# Patient Record
Sex: Female | Born: 1980 | Race: White | Hispanic: No | Marital: Married | Smoking: Never smoker
Health system: Southern US, Community
[De-identification: ages and names within clinical notes are randomized; demographics above are authoritative.]

## PROBLEM LIST (undated history)

## (undated) ENCOUNTER — Inpatient Hospital Stay (HOSPITAL_COMMUNITY): Payer: Self-pay

## (undated) DIAGNOSIS — K219 Gastro-esophageal reflux disease without esophagitis: Secondary | ICD-10-CM

## (undated) DIAGNOSIS — F419 Anxiety disorder, unspecified: Secondary | ICD-10-CM

## (undated) DIAGNOSIS — G43909 Migraine, unspecified, not intractable, without status migrainosus: Secondary | ICD-10-CM

## (undated) DIAGNOSIS — Z8619 Personal history of other infectious and parasitic diseases: Secondary | ICD-10-CM

## (undated) DIAGNOSIS — R000 Tachycardia, unspecified: Secondary | ICD-10-CM

## (undated) DIAGNOSIS — N854 Malposition of uterus: Secondary | ICD-10-CM

## (undated) DIAGNOSIS — F909 Attention-deficit hyperactivity disorder, unspecified type: Secondary | ICD-10-CM

## (undated) DIAGNOSIS — U071 COVID-19: Secondary | ICD-10-CM

## (undated) DIAGNOSIS — N3010 Interstitial cystitis (chronic) without hematuria: Secondary | ICD-10-CM

## (undated) DIAGNOSIS — Z87448 Personal history of other diseases of urinary system: Secondary | ICD-10-CM

## (undated) DIAGNOSIS — J302 Other seasonal allergic rhinitis: Secondary | ICD-10-CM

## (undated) DIAGNOSIS — D649 Anemia, unspecified: Secondary | ICD-10-CM

## (undated) DIAGNOSIS — K589 Irritable bowel syndrome without diarrhea: Secondary | ICD-10-CM

## (undated) DIAGNOSIS — B999 Unspecified infectious disease: Secondary | ICD-10-CM

## (undated) HISTORY — DX: Anxiety disorder, unspecified: F41.9

## (undated) HISTORY — DX: Anemia, unspecified: D64.9

## (undated) HISTORY — DX: Migraine, unspecified, not intractable, without status migrainosus: G43.909

## (undated) HISTORY — DX: Interstitial cystitis (chronic) without hematuria: N30.10

## (undated) HISTORY — PX: TUBAL LIGATION: SHX77

## (undated) HISTORY — DX: Malposition of uterus: N85.4

## (undated) HISTORY — DX: Personal history of other diseases of urinary system: Z87.448

## (undated) HISTORY — DX: Personal history of other infectious and parasitic diseases: Z86.19

## (undated) HISTORY — DX: Irritable bowel syndrome, unspecified: K58.9

## (undated) HISTORY — DX: Attention-deficit hyperactivity disorder, unspecified type: F90.9

---

## 1999-03-20 ENCOUNTER — Encounter: Admission: RE | Admit: 1999-03-20 | Discharge: 1999-03-20 | Payer: Self-pay | Admitting: Urology

## 1999-03-20 ENCOUNTER — Encounter: Payer: Self-pay | Admitting: Urology

## 1999-03-21 ENCOUNTER — Encounter: Payer: Self-pay | Admitting: Urology

## 1999-03-21 ENCOUNTER — Ambulatory Visit (HOSPITAL_COMMUNITY): Admission: RE | Admit: 1999-03-21 | Discharge: 1999-03-21 | Payer: Self-pay | Admitting: Urology

## 2003-07-12 ENCOUNTER — Inpatient Hospital Stay (HOSPITAL_COMMUNITY): Admission: EM | Admit: 2003-07-12 | Discharge: 2003-07-14 | Payer: Self-pay | Admitting: Emergency Medicine

## 2007-08-06 ENCOUNTER — Encounter: Payer: Self-pay | Admitting: Cardiology

## 2008-05-18 ENCOUNTER — Encounter: Payer: Self-pay | Admitting: Cardiology

## 2008-06-07 DIAGNOSIS — R0602 Shortness of breath: Secondary | ICD-10-CM | POA: Insufficient documentation

## 2008-06-07 DIAGNOSIS — R000 Tachycardia, unspecified: Secondary | ICD-10-CM | POA: Insufficient documentation

## 2008-06-07 HISTORY — DX: Shortness of breath: R06.02

## 2008-06-07 HISTORY — DX: Tachycardia, unspecified: R00.0

## 2008-06-08 ENCOUNTER — Ambulatory Visit: Payer: Self-pay | Admitting: Cardiology

## 2008-07-22 ENCOUNTER — Ambulatory Visit: Payer: Self-pay | Admitting: Cardiology

## 2008-07-22 ENCOUNTER — Ambulatory Visit: Payer: Self-pay

## 2008-08-03 ENCOUNTER — Telehealth (INDEPENDENT_AMBULATORY_CARE_PROVIDER_SITE_OTHER): Payer: Self-pay | Admitting: *Deleted

## 2008-08-04 ENCOUNTER — Encounter: Payer: Self-pay | Admitting: Cardiology

## 2008-08-04 ENCOUNTER — Ambulatory Visit: Payer: Self-pay

## 2008-08-06 ENCOUNTER — Telehealth: Payer: Self-pay | Admitting: Cardiology

## 2008-08-12 ENCOUNTER — Telehealth: Payer: Self-pay | Admitting: Cardiology

## 2008-08-23 ENCOUNTER — Ambulatory Visit: Payer: Self-pay | Admitting: Cardiology

## 2008-09-06 ENCOUNTER — Telehealth: Payer: Self-pay | Admitting: Cardiology

## 2008-10-14 ENCOUNTER — Ambulatory Visit: Payer: Self-pay | Admitting: Cardiology

## 2008-12-10 ENCOUNTER — Emergency Department (HOSPITAL_COMMUNITY): Admission: EM | Admit: 2008-12-10 | Discharge: 2008-12-10 | Payer: Self-pay | Admitting: Family Medicine

## 2010-03-12 LAB — CONVERTED CEMR LAB
Basophils Absolute: 0 10*3/uL (ref 0.0–0.1)
Basophils Relative: 0 % (ref 0.0–3.0)
Eosinophils Absolute: 0 10*3/uL (ref 0.0–0.7)
Eosinophils Relative: 0.6 % (ref 0.0–5.0)
HCT: 45.1 % (ref 36.0–46.0)
Hemoglobin: 15.4 g/dL — ABNORMAL HIGH (ref 12.0–15.0)
Lymphocytes Relative: 24.3 % (ref 12.0–46.0)
Lymphs Abs: 1.7 10*3/uL (ref 0.7–4.0)
MCHC: 34 g/dL (ref 30.0–36.0)
MCV: 92.1 fL (ref 78.0–100.0)
Monocytes Absolute: 0.2 10*3/uL (ref 0.1–1.0)
Monocytes Relative: 2.4 % — ABNORMAL LOW (ref 3.0–12.0)
Neutro Abs: 5.3 10*3/uL (ref 1.4–7.7)
Neutrophils Relative %: 72.7 % (ref 43.0–77.0)
Platelets: 200 10*3/uL (ref 150.0–400.0)
RBC: 4.9 M/uL (ref 3.87–5.11)
RDW: 11.7 % (ref 11.5–14.6)
TSH: 1.45 microintl units/mL (ref 0.35–5.50)
WBC: 7.2 10*3/uL (ref 4.5–10.5)

## 2010-03-16 LAB — STREP B DNA PROBE: GBS: NEGATIVE

## 2010-03-31 ENCOUNTER — Ambulatory Visit (HOSPITAL_COMMUNITY)
Admission: RE | Admit: 2010-03-31 | Discharge: 2010-03-31 | Disposition: A | Discharge status: Home / Self Care | Payer: BC Managed Care – PPO | Source: Ambulatory Visit | Attending: Obstetrics and Gynecology | Admitting: Obstetrics and Gynecology

## 2010-03-31 DIAGNOSIS — M79609 Pain in unspecified limb: Secondary | ICD-10-CM | POA: Insufficient documentation

## 2010-05-18 LAB — POCT URINALYSIS DIP (DEVICE)
Bilirubin Urine: NEGATIVE
Nitrite: NEGATIVE
Protein, ur: 30 mg/dL — AB
pH: 6.5 (ref 5.0–8.0)

## 2010-05-18 LAB — URINE CULTURE

## 2010-06-30 NOTE — H&P (Signed)
NAME:  Tanya [name], BOOZ                            ACCOUNT NO.:  [id number]   MEDICAL RECORD NO.:  [id number]                   PATIENT TYPE:  EMS   LOCATION:  ED                                   FACILITY:  [facility]   PHYSICIAN:  Hanley Hays. Dechurch, M.D.           DATE OF BIRTH:  03-08-1980   DATE OF ADMISSION:  07/12/2003  DATE OF DISCHARGE:                                HISTORY & PHYSICAL   A 30 year old Caucasian female with history of chronic cystitis and  vesicoureteral reflux presents with a 12-hour history of acute onset nausea,  vomiting, right flank pain with fever and chills.  She was treated for a UTI  in early May with unknown antibiotic.  Here in the emergency room, she was  initially tachycardic with rates in the 150s and in significant distress  secondary to pain and nausea.  Her white count was 22,000.  The patient  underwent CT scanning which revealed an enlarged right kidney which  apparently is somewhat chronic.  No obstruction but evidence of perinephric  stranding and question of a cyst versus abscess on the upper pole.  She  received Rocephin and Levaquin in the emergency room and is being admitted  to the hospital for further treatment.  A urinalysis is consistent with UTI  with too-numerous-to-count white cells and bacteria.   PAST MEDICAL HISTORY:  Vesicoureteral reflux and recurrent UTI.  She has  never had pyelonephritis to her knowledge.  Otherwise healthy with history  of cystoscopy, cystometrogram in 2001 per Dr. Logan Bores.   PAST SURGICAL HISTORY:  No past surgical history, gravida 0, para 0.  Last  menstrual period was two weeks ago.   MEDICATIONS:  None.  She previously was on oral contraceptives until  recently, stopped because of mid cycle spotting.   SOCIAL HISTORY:  She is single, sexually active.  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  No alcohol, tobacco, or illicit drugs.   REVIEW OF SYSTEMS:  As per HPI.   FAMILY MEDICAL  HISTORY:  She has a mother with hypothyroidism.  She has an  aunt who has had renal and hepatic transplant.   PHYSICAL EXAMINATION:  GENERAL: Well-developed, well-nourished, pleasant  female in no distress.  VITAL SIGNS:  Temperature 102.3, pulse currently 120, blood pressure 120/60.  HEENT: Oropharynx is moist,no lesions.  LUNGS:  Clear to auscultation.  HEART:  Regular, tachycardic, no murmur.  ABDOMEN:  Soft.  There is some mild to moderate right flank and upper  quadrant tenderness.  No masses noted.  No guarding, no rebound.  Bowel  sounds are positive.  EXTREMITIES:  Without clubbing, cyanosis, or edema.  NEUROLOGIC:  Intact.  SKIN:  Without rash, lesion, or breakdown.   IMPRESSION:  1. Pyelonephritis.  2. History of vesicoureteral reflux/chronic cystitis, has been stable.  3. Tachycardia, probably on the basis of volume depletion.   PLAN:  1.  Continue the current antibiotic regimen until culture data is available.  2. IV fluids.     ___________________________________________                                         Hanley Hays Josefine Class, M.D.   FED/MEDQ  D:  07/12/2003  T:  07/12/2003  Job:  [id number]

## 2010-06-30 NOTE — Discharge Summary (Signed)
NAME:  Tanya [name], Tanya [name]                            ACCOUNT NO.:  [id number]   MEDICAL RECORD NO.:  [id number]                   PATIENT TYPE:  INP   LOCATION:  A409                                 FACILITY:  [facility]   PHYSICIAN:  Hanley Hays. Dechurch, M.D.           DATE OF BIRTH:  1980-12-11   DATE OF ADMISSION:  07/12/2003  DATE OF DISCHARGE:  07/14/2003                                 DISCHARGE SUMMARY   DIAGNOSES:  1. Right pyelonephritis due to E. coli.  2. History of chronic vesicoureteral reflux, chronically enlarged right     kidney.  3. Nephrolithiasis, asymptomatic.  4. Dehydration, resolved.   HOSPITAL COURSE:  This is a healthy 31 year old Caucasian female with no  regular medical follow-up presents to the emergency room with a one day  history of flank pain, nausea, vomiting, chills and fever.  CT scan revealed  an enlarged right kidney with perinephric stranding and question of an early  abscess versus cyst in the upper pole.   She received Rocephin and Levaquin and was admitted to the hospital for  further treatment.  The urine culture grew E. coli.  Sensitivities are  pending at the time of discharge.  Blood cultures remained negative at 48  hours.  She was markedly improved.  Her white count was near normal and she  was back to baseline stage.   She is being discharged to home.  She is scheduled to follow-up with Dr. Geanie Cooley, who was the unassigned physician on call for that day.  She was  advised to follow-up with her urologist, given her history, to be sure that  the pyelonephritis has cleared.  Complete a 14 day course of antibiotic.  The patient seemed to have good understanding at the time of discharge.   She is being discharged on Levaquin 500 mg daily pending culture.  She is  instructed to call with recurrent fever, abdominal pain, chills, etc. or if  there are any questions to me until she is seen by the follow-up physician.  She may need a follow-up CT  scan to assure clearing of the questionable  abscess.     ___________________________________________                                         Hanley Hays Josefine Class, M.D.   FED/MEDQ  D:  07/14/2003  T:  07/15/2003  Job:  [id number]   cc:   [facility]

## 2010-09-19 LAB — ABO/RH

## 2010-09-19 LAB — HEPATITIS B SURFACE ANTIGEN: Hepatitis B Surface Ag: NEGATIVE

## 2010-09-19 LAB — RPR: RPR: NONREACTIVE

## 2010-11-16 ENCOUNTER — Encounter (HOSPITAL_COMMUNITY): Payer: Self-pay | Admitting: *Deleted

## 2010-11-16 ENCOUNTER — Inpatient Hospital Stay (HOSPITAL_COMMUNITY)
Admission: AD | Admit: 2010-11-16 | Discharge: 2010-11-16 | Disposition: A | Discharge status: Home / Self Care | Payer: BC Managed Care – PPO | Source: Ambulatory Visit | Attending: Obstetrics and Gynecology | Admitting: Obstetrics and Gynecology

## 2010-11-16 DIAGNOSIS — O2390 Unspecified genitourinary tract infection in pregnancy, unspecified trimester: Secondary | ICD-10-CM | POA: Insufficient documentation

## 2010-11-16 DIAGNOSIS — A499 Bacterial infection, unspecified: Secondary | ICD-10-CM | POA: Insufficient documentation

## 2010-11-16 DIAGNOSIS — O200 Threatened abortion: Secondary | ICD-10-CM | POA: Insufficient documentation

## 2010-11-16 DIAGNOSIS — B9689 Other specified bacterial agents as the cause of diseases classified elsewhere: Secondary | ICD-10-CM

## 2010-11-16 DIAGNOSIS — N760 Acute vaginitis: Secondary | ICD-10-CM

## 2010-11-16 LAB — URINALYSIS, ROUTINE W REFLEX MICROSCOPIC
Leukocytes, UA: NEGATIVE
Nitrite: NEGATIVE
Specific Gravity, Urine: <1.005 — ABNORMAL LOW (ref 1.005–1.030)
pH: 6.5 (ref 5.0–8.0)

## 2010-11-16 LAB — WET PREP, GENITAL
Trich, Wet Prep: NONE SEEN
Yeast Wet Prep HPF POC: NONE SEEN

## 2010-11-16 MED ORDER — METRONIDAZOLE 500 MG PO TABS
500.0000 mg | ORAL_TABLET | Freq: Once | ORAL | Status: AC
  Administered 2010-11-16: 500 mg via ORAL
  Filled 2010-11-16: qty 1

## 2010-11-16 MED ORDER — METRONIDAZOLE 500 MG PO TABS: 500.0000 mg | ORAL_TABLET | Freq: Two times a day (BID) | ORAL | Status: AC

## 2010-11-16 NOTE — Progress Notes (Signed)
SAYS STARTED HAVING LOWER ABD PAIN AT 8AM-  SHE CALLED OFFICE AT 2PM- RETURNED CALLED CALL AT 5PM - SHE CALLED BACK-  TOLD TO COME HERE.  SAYS ABD PAIN COMES-GOES.  DRINKING WATER. WENT TO [facility]ST NIGHT - NO JOLTING RIDES.   HAD THICK WHITE D/C- IS ON ANTX FOR UTI.

## 2010-11-16 NOTE — ED Provider Notes (Signed)
History   Tanya [name] is a 30 y.o. year old [id number] female at [redacted]w[redacted]d weeks gestation who presents to MAU reporting contractions every 5-10 minutes this afternoon that have decreased since arrival to MAU. She reports pos FM and denies LOF or VB. She is at the end of a course of Macrobid for UTI and did experience Sx relief within 1 day of starting Tx. She denies UTI Sx.  CC: contractions  HPI  OB History    Grav Para Term Preterm Abortions TAB SAB Ect Mult Living   2 1 1       1       Past Medical History  Diagnosis Date  . No pertinent past medical history     Past Surgical History  Procedure Date  . No past surgeries     Family History  Problem Relation Age of Onset  . Hypertension Mother   . Hypothyroidism Mother   . Heart disease Sister     History  Substance Use Topics  . Smoking status: Never Smoker   . Smokeless tobacco: Not on file  . Alcohol Use: No    Allergies: No Known Allergies  Prescriptions prior to admission  Medication Sig Dispense Refill  . nitrofurantoin (MACRODANTIN) 100 MG capsule Take 100 mg by mouth 2 (two) times daily.        . prenatal vitamin w/FE, FA (PRENATAL 1 + 1) 27-1 MG TABS Take 1 tablet by mouth daily.          ROS: Otherwise neg Physical Exam   Blood pressure 113/67, pulse 93, temperature 98.9 F (37.2 C), temperature source Oral, resp. rate 20, height 5\' 5"  (1.651 m), weight 84.482 kg (186 lb 4 oz).  Physical Exam  Constitutional: She is oriented to person, place, and time. She appears well-developed and well-nourished. No distress.  Cardiovascular: Normal rate.   Respiratory: Effort normal.  GI: Soft. She exhibits no distension and no mass. There is tenderness (Mild LLQ). There is no guarding.       No UC's palpated  Genitourinary: Uterus is not tender. Cervix exhibits no motion tenderness, no discharge and no friability. There is bleeding around the vagina. No erythema or tenderness around the vagina. Vaginal discharge (mod  amount of thin white, malodorous discharge) found.  Neurological: She is alert and oriented to person, place, and time.  Skin: Skin is warm and dry. She is not diaphoretic.  Psychiatric: She has a normal mood and affect.  Dilation: Closed Effacement (%): Thick Cervical Position: Posterior Station: Ballotable Presentation: Undeterminable  Results for orders placed during the hospital encounter of 11/16/10 (from the past 24 hour(s))  URINALYSIS, ROUTINE W REFLEX MICROSCOPIC     Status: Abnormal   Collection Time   11/16/10  7:59 PM      Component Value Range   Color, Urine STRAW (*) YELLOW    Appearance CLEAR  CLEAR    Specific Gravity, Urine <1.005 (*) 1.005 - 1.030    pH 6.5  5.0 - 8.0    Glucose, UA NEGATIVE  NEGATIVE (mg/dL)   Hgb urine dipstick NEGATIVE  NEGATIVE    Bilirubin Urine NEGATIVE  NEGATIVE    Ketones, ur NEGATIVE  NEGATIVE (mg/dL)   Protein, ur NEGATIVE  NEGATIVE (mg/dL)   Urobilinogen, UA 0.2  0.0 - 1.0 (mg/dL)   Nitrite NEGATIVE  NEGATIVE    Leukocytes, UA NEGATIVE  NEGATIVE   WET PREP, GENITAL     Status: Abnormal   Collection Time  11/16/10  9:34 PM      Component Value Range   Yeast, Wet Prep NONE SEEN  NONE SEEN    Trich, Wet Prep NONE SEEN  NONE SEEN    Clue Cells, Wet Prep FEW (*) NONE SEEN    WBC, Wet Prep HPF POC FEW (*) NONE SEEN     MAU Course  Procedures   Assessment and Plan  Assessment:  1. BV  Plan: 1. D/C home per consult w/ Dr. Rana Snare 2. Flagyl  [name] 11/16/2010, 9:59 PM

## 2010-12-20 ENCOUNTER — Other Ambulatory Visit: Payer: Self-pay

## 2010-12-20 ENCOUNTER — Other Ambulatory Visit (HOSPITAL_COMMUNITY): Payer: Self-pay | Admitting: Obstetrics and Gynecology

## 2010-12-20 DIAGNOSIS — IMO0002 Reserved for concepts with insufficient information to code with codable children: Secondary | ICD-10-CM

## 2010-12-26 ENCOUNTER — Ambulatory Visit (HOSPITAL_COMMUNITY)
Admission: RE | Admit: 2010-12-26 | Discharge: 2010-12-26 | Disposition: A | Discharge status: Home / Self Care | Payer: BC Managed Care – PPO | Source: Ambulatory Visit | Attending: Obstetrics and Gynecology | Admitting: Obstetrics and Gynecology

## 2010-12-26 DIAGNOSIS — Z3689 Encounter for other specified antenatal screening: Secondary | ICD-10-CM | POA: Insufficient documentation

## 2010-12-26 DIAGNOSIS — IMO0002 Reserved for concepts with insufficient information to code with codable children: Secondary | ICD-10-CM

## 2010-12-26 DIAGNOSIS — O368390 Maternal care for abnormalities of the fetal heart rate or rhythm, unspecified trimester, not applicable or unspecified: Secondary | ICD-10-CM | POA: Insufficient documentation

## 2010-12-26 NOTE — Progress Notes (Signed)
OB US completed today.  No evidence of fetal arrhythmia noted.  Follow up scheduled.

## 2011-01-03 ENCOUNTER — Emergency Department (INDEPENDENT_AMBULATORY_CARE_PROVIDER_SITE_OTHER)
Admission: EM | Admit: 2011-01-03 | Discharge: 2011-01-03 | Disposition: A | Discharge status: Home / Self Care | Payer: BC Managed Care – PPO | Source: Home / Self Care

## 2011-01-03 ENCOUNTER — Encounter (HOSPITAL_COMMUNITY): Payer: Self-pay | Admitting: *Deleted

## 2011-01-03 DIAGNOSIS — J329 Chronic sinusitis, unspecified: Secondary | ICD-10-CM

## 2011-01-03 HISTORY — DX: Tachycardia, unspecified: R00.0

## 2011-01-03 MED ORDER — AMOXICILLIN 500 MG PO CAPS: 500.0000 mg | ORAL_CAPSULE | Freq: Two times a day (BID) | ORAL | Status: AC

## 2011-01-03 MED ORDER — TRIAMCINOLONE ACETONIDE(NASAL) 55 MCG/ACT NA INHA: 2.0000 | Freq: Every day | NASAL | Status: DC

## 2011-01-03 NOTE — ED Provider Notes (Signed)
History     CSN: [id number] Arrival date & time: 01/03/2011  8:31 AM   First MD Initiated Contact with Patient 01/03/11 [phone]      Chief Complaint  Patient presents with  . URI    (Consider location/radiation/quality/duration/timing/severity/associated sxs/prior treatment) Patient is a 30 y.o. female presenting with URI and sinusitis.  URI The primary symptoms include headaches, sore throat and cough. Primary symptoms do not include fever, fatigue, ear pain, swollen glands, wheezing, abdominal pain, nausea, vomiting, myalgias, arthralgias or rash. The current episode started more than 1 week ago.  The sore throat is not accompanied by hoarse voice.   Symptoms associated with the illness include sinus pressure and congestion. The illness is not associated with chills.  Sinusitis  This is a recurrent problem. The current episode started more than 1 week ago. The problem has been gradually worsening. There has been no fever. The pain is moderate. The pain has been constant since onset. Associated symptoms include congestion, sinus pressure, sore throat and cough. Pertinent negatives include no chills, no sweats, no ear pain, no hoarse voice, no swollen glands and no shortness of breath. Associated symptoms comments: "Just not feeling well". Treatments tried: antihistamin. The treatment provided mild relief.    Past Medical History  Diagnosis Date  . No pertinent past medical history   . Tachycardia     Past Surgical History  Procedure Date  . No past surgeries     Family History  Problem Relation Age of Onset  . Hypertension Mother   . Hypothyroidism Mother   . Heart disease Sister     History  Substance Use Topics  . Smoking status: Never Smoker   . Smokeless tobacco: Not on file  . Alcohol Use: No    OB History    Grav Para Term Preterm Abortions TAB SAB Ect Mult Living   2 1 1       1       Review of Systems  Constitutional: Negative for fever, chills, activity  change, appetite change and fatigue.  HENT: Positive for congestion, sore throat and sinus pressure. Negative for ear pain, hoarse voice, dental problem and voice change.   Eyes: Negative for discharge.  Respiratory: Positive for cough. Negative for shortness of breath and wheezing.   Cardiovascular: Negative for chest pain and leg swelling.  Gastrointestinal: Negative for nausea, vomiting and abdominal pain.  Musculoskeletal: Negative for myalgias and arthralgias.  Skin: Negative for rash.  Neurological: Positive for headaches.    Allergies  Review of patient's allergies indicates no known allergies.  Home Medications   Current Outpatient Rx  Name Route Sig Dispense Refill  . HYDROXYZINE HCL 10 MG PO TABS Oral Take 10 mg by mouth 3 (three) times daily as needed.      Marland Kitchen PRENATAL PLUS 27-1 MG PO TABS Oral Take 1 tablet by mouth daily.      . AMOXICILLIN 500 MG PO CAPS Oral Take 1 capsule (500 mg total) by mouth 2 (two) times daily. 21 capsule 0  . NITROFURANTOIN MACROCRYSTAL 100 MG PO CAPS Oral Take 100 mg by mouth 2 (two) times daily.      . TRIAMCINOLONE ACETONIDE 55 MCG/ACT NA INHA Nasal Place 2 sprays into the nose daily. 1 Inhaler 12    BP 120/78  Pulse 106  Temp(Src) 98.2 F (36.8 C) (Oral)  Resp 18  SpO2 98%  Physical Exam  Nursing note and vitals reviewed. Constitutional: She appears well-developed and well-nourished. No distress.  HENT:  Head: Normocephalic and atraumatic.  Right Ear: Tympanic membrane, external ear and ear canal normal.  Left Ear: Tympanic membrane, external ear and ear canal normal.  Nose: Mucosal edema and rhinorrhea present. Right sinus exhibits maxillary sinus tenderness. Left sinus exhibits maxillary sinus tenderness.  Mouth/Throat: Uvula is midline and mucous membranes are normal. Normal dentition. Posterior oropharyngeal erythema present. No oropharyngeal exudate or posterior oropharyngeal edema.  Cardiovascular: Normal rate, regular rhythm  and normal heart sounds.   Pulmonary/Chest: Effort normal and breath sounds normal. No respiratory distress. She has no wheezes. She has no rales. She exhibits no tenderness.    ED Course  Procedures (including critical care time)  Labs Reviewed - No data to display No results found.   1. Rhinosinusitis       MDM          Sharin Grave, MD 01/07/11 [phone]

## 2011-01-03 NOTE — ED Notes (Signed)
Pt c/o sinus congestion and pain onset last week.  Thought it was due to changing from Zyrtec to Atarax.  But states it's getting progressively worse.  Has thick green nasal drainage and throat hurts some.  No fever noted. Pt is [redacted] weeks pregnant.  Cannot take any meds with a decongestant in it due to the fetus has an arrythmia.

## 2011-01-23 ENCOUNTER — Ambulatory Visit (HOSPITAL_COMMUNITY)
Admission: RE | Admit: 2011-01-23 | Discharge: 2011-01-23 | Disposition: A | Discharge status: Home / Self Care | Payer: BC Managed Care – PPO | Source: Ambulatory Visit | Attending: Obstetrics and Gynecology | Admitting: Obstetrics and Gynecology

## 2011-01-23 DIAGNOSIS — IMO0002 Reserved for concepts with insufficient information to code with codable children: Secondary | ICD-10-CM

## 2011-01-23 DIAGNOSIS — O368390 Maternal care for abnormalities of the fetal heart rate or rhythm, unspecified trimester, not applicable or unspecified: Secondary | ICD-10-CM | POA: Insufficient documentation

## 2011-02-13 NOTE — L&D Delivery Note (Signed)
I was called for a delivery and arrived in 3 minutes to a precipitous delivery attended by resident physician of the baby.  I took over with cord blood and placenta.  SVD viable female Apgars 9,9 over intact perineum.  Placenta delivered spontaneously intact with 3VC.good support and hemostasis noted and R/V exam confirms.  PH art was sent.  [place] cord blood was done.  Mother and baby were doing well.  EBL 300cc  Candice Camp, MD

## 2011-03-27 LAB — STREP B DNA PROBE: GBS: NEGATIVE

## 2011-04-09 ENCOUNTER — Telehealth (HOSPITAL_COMMUNITY): Payer: Self-pay | Admitting: *Deleted

## 2011-04-09 ENCOUNTER — Encounter (HOSPITAL_COMMUNITY): Payer: Self-pay | Admitting: *Deleted

## 2011-04-09 NOTE — Telephone Encounter (Signed)
Preadmission screen  

## 2011-04-14 ENCOUNTER — Encounter (HOSPITAL_COMMUNITY): Payer: Self-pay

## 2011-04-14 ENCOUNTER — Inpatient Hospital Stay (HOSPITAL_COMMUNITY)
Admission: AD | Admit: 2011-04-14 | Discharge: 2011-04-14 | Disposition: A | Discharge status: Home Health | Payer: BC Managed Care – PPO | Source: Ambulatory Visit | Attending: Obstetrics and Gynecology | Admitting: Obstetrics and Gynecology

## 2011-04-14 DIAGNOSIS — O479 False labor, unspecified: Secondary | ICD-10-CM | POA: Insufficient documentation

## 2011-04-14 NOTE — Progress Notes (Signed)
Dr Henderson Cloud notified of patient history, c/o ctx and back pain, tracing, ctx pattern and sve result. Order to discharge home

## 2011-04-14 NOTE — Progress Notes (Signed)
Patient is in with c/o ctx q72m for an hour. She states that she was 1cm this week. Reports good fetal movement, denies any vaginal bleeding or lof.

## 2011-04-16 ENCOUNTER — Inpatient Hospital Stay (HOSPITAL_COMMUNITY)
Admission: RE | Admit: 2011-04-16 | Discharge: 2011-04-18 | DRG: 373 | Disposition: A | Discharge status: Home / Self Care | Payer: BC Managed Care – PPO | Source: Ambulatory Visit | Attending: Obstetrics and Gynecology | Admitting: Obstetrics and Gynecology

## 2011-04-16 DIAGNOSIS — B343 Parvovirus infection, unspecified: Secondary | ICD-10-CM | POA: Diagnosis present

## 2011-04-16 DIAGNOSIS — O99892 Other specified diseases and conditions complicating childbirth: Secondary | ICD-10-CM | POA: Diagnosis present

## 2011-04-16 DIAGNOSIS — O409XX0 Polyhydramnios, unspecified trimester, not applicable or unspecified: Principal | ICD-10-CM | POA: Diagnosis present

## 2011-04-16 LAB — CBC
HCT: 34.7 % — ABNORMAL LOW (ref 36.0–46.0)
Hemoglobin: 11.6 g/dL — ABNORMAL LOW (ref 12.0–15.0)
MCH: 29.6 pg (ref 26.0–34.0)
MCHC: 33.4 g/dL (ref 30.0–36.0)
RDW: 13.1 % (ref 11.5–15.5)

## 2011-04-16 MED ORDER — LACTATED RINGERS IV SOLN
INTRAVENOUS | Status: DC
  Administered 2011-04-16 – 2011-04-17 (×3): via INTRAVENOUS

## 2011-04-16 MED ORDER — ONDANSETRON HCL 4 MG/2ML IJ SOLN: 4.0000 mg | Freq: Four times a day (QID) | INTRAMUSCULAR | Status: DC | PRN

## 2011-04-16 MED ORDER — OXYTOCIN 20 UNITS IN LACTATED RINGERS INFUSION - SIMPLE: 125.0000 mL/h | Freq: Once | INTRAVENOUS | Status: DC

## 2011-04-16 MED ORDER — LACTATED RINGERS IV SOLN: 500.0000 mL | INTRAVENOUS | Status: DC | PRN

## 2011-04-16 MED ORDER — OXYCODONE-ACETAMINOPHEN 5-325 MG PO TABS: 1.0000 | ORAL_TABLET | ORAL | Status: DC | PRN

## 2011-04-16 MED ORDER — TERBUTALINE SULFATE 1 MG/ML IJ SOLN: 0.2500 mg | Freq: Once | INTRAMUSCULAR | Status: AC | PRN

## 2011-04-16 MED ORDER — PANTOPRAZOLE SODIUM 40 MG PO TBEC
40.0000 mg | DELAYED_RELEASE_TABLET | Freq: Every day | ORAL | Status: DC
  Administered 2011-04-16: 40 mg via ORAL
  Filled 2011-04-16 (×3): qty 1

## 2011-04-16 MED ORDER — IBUPROFEN 600 MG PO TABS: 600.0000 mg | ORAL_TABLET | Freq: Four times a day (QID) | ORAL | Status: DC | PRN

## 2011-04-16 MED ORDER — CITRIC ACID-SODIUM CITRATE 334-500 MG/5ML PO SOLN: 30.0000 mL | ORAL | Status: DC | PRN

## 2011-04-16 MED ORDER — OXYTOCIN BOLUS FROM INFUSION
500.0000 mL | Freq: Once | INTRAVENOUS | Status: DC
  Filled 2011-04-16: qty 500

## 2011-04-16 MED ORDER — ZOLPIDEM TARTRATE 10 MG PO TABS
10.0000 mg | ORAL_TABLET | Freq: Every evening | ORAL | Status: DC | PRN
  Administered 2011-04-16: 10 mg via ORAL
  Filled 2011-04-16: qty 1

## 2011-04-16 MED ORDER — ACETAMINOPHEN 325 MG PO TABS: 650.0000 mg | ORAL_TABLET | ORAL | Status: DC | PRN

## 2011-04-16 MED ORDER — LIDOCAINE HCL (PF) 1 % IJ SOLN: 30.0000 mL | INTRAMUSCULAR | Status: DC | PRN

## 2011-04-16 MED ORDER — FLEET ENEMA 7-19 GM/118ML RE ENEM: 1.0000 | ENEMA | RECTAL | Status: DC | PRN

## 2011-04-16 MED ORDER — MISOPROSTOL 25 MCG QUARTER TABLET
25.0000 ug | ORAL_TABLET | ORAL | Status: DC | PRN
  Administered 2011-04-16 – 2011-04-17 (×2): 25 ug via VAGINAL
  Filled 2011-04-16 (×2): qty 0.25

## 2011-04-17 ENCOUNTER — Encounter (HOSPITAL_COMMUNITY): Payer: Self-pay

## 2011-04-17 ENCOUNTER — Encounter (HOSPITAL_COMMUNITY): Payer: Self-pay | Admitting: Anesthesiology

## 2011-04-17 ENCOUNTER — Inpatient Hospital Stay (HOSPITAL_COMMUNITY): Payer: BC Managed Care – PPO | Admitting: Anesthesiology

## 2011-04-17 MED ORDER — ONDANSETRON HCL 4 MG PO TABS: 4.0000 mg | ORAL_TABLET | ORAL | Status: DC | PRN

## 2011-04-17 MED ORDER — BUTORPHANOL TARTRATE 2 MG/ML IJ SOLN
1.0000 mg | Freq: Once | INTRAMUSCULAR | Status: AC
  Administered 2011-04-17: 1 mg via INTRAVENOUS
  Filled 2011-04-17: qty 1

## 2011-04-17 MED ORDER — FENTANYL 2.5 MCG/ML BUPIVACAINE 1/10 % EPIDURAL INFUSION (WH - ANES)
14.0000 mL/h | INTRAMUSCULAR | Status: DC
  Administered 2011-04-17 (×2): 14 mL/h via EPIDURAL
  Filled 2011-04-17 (×2): qty 60

## 2011-04-17 MED ORDER — ONDANSETRON HCL 4 MG/2ML IJ SOLN: 4.0000 mg | INTRAMUSCULAR | Status: DC | PRN

## 2011-04-17 MED ORDER — EPHEDRINE 5 MG/ML INJ
10.0000 mg | INTRAVENOUS | Status: DC | PRN
  Filled 2011-04-17: qty 4

## 2011-04-17 MED ORDER — TERBUTALINE SULFATE 1 MG/ML IJ SOLN: 0.2500 mg | Freq: Once | INTRAMUSCULAR | Status: DC | PRN

## 2011-04-17 MED ORDER — PHENYLEPHRINE 40 MCG/ML (10ML) SYRINGE FOR IV PUSH (FOR BLOOD PRESSURE SUPPORT): 80.0000 ug | PREFILLED_SYRINGE | INTRAVENOUS | Status: DC | PRN

## 2011-04-17 MED ORDER — MEDROXYPROGESTERONE ACETATE 150 MG/ML IM SUSP: 150.0000 mg | INTRAMUSCULAR | Status: DC | PRN

## 2011-04-17 MED ORDER — SIMETHICONE 80 MG PO CHEW: 80.0000 mg | CHEWABLE_TABLET | ORAL | Status: DC | PRN

## 2011-04-17 MED ORDER — OXYCODONE-ACETAMINOPHEN 5-325 MG PO TABS
1.0000 | ORAL_TABLET | ORAL | Status: DC | PRN
  Administered 2011-04-18 (×2): 1 via ORAL
  Filled 2011-04-17 (×2): qty 1

## 2011-04-17 MED ORDER — TETANUS-DIPHTH-ACELL PERTUSSIS 5-2.5-18.5 LF-MCG/0.5 IM SUSP: 0.5000 mL | Freq: Once | INTRAMUSCULAR | Status: DC

## 2011-04-17 MED ORDER — WITCH HAZEL-GLYCERIN EX PADS: 1.0000 "application " | MEDICATED_PAD | CUTANEOUS | Status: DC | PRN

## 2011-04-17 MED ORDER — ZOLPIDEM TARTRATE 5 MG PO TABS: 5.0000 mg | ORAL_TABLET | Freq: Every evening | ORAL | Status: DC | PRN

## 2011-04-17 MED ORDER — EPHEDRINE 5 MG/ML INJ: 10.0000 mg | INTRAVENOUS | Status: DC | PRN

## 2011-04-17 MED ORDER — PHENYLEPHRINE 40 MCG/ML (10ML) SYRINGE FOR IV PUSH (FOR BLOOD PRESSURE SUPPORT)
80.0000 ug | PREFILLED_SYRINGE | INTRAVENOUS | Status: DC | PRN
  Filled 2011-04-17: qty 5

## 2011-04-17 MED ORDER — LANOLIN HYDROUS EX OINT: TOPICAL_OINTMENT | CUTANEOUS | Status: DC | PRN

## 2011-04-17 MED ORDER — DIPHENHYDRAMINE HCL 25 MG PO CAPS: 25.0000 mg | ORAL_CAPSULE | Freq: Four times a day (QID) | ORAL | Status: DC | PRN

## 2011-04-17 MED ORDER — IBUPROFEN 600 MG PO TABS
600.0000 mg | ORAL_TABLET | Freq: Four times a day (QID) | ORAL | Status: DC
  Administered 2011-04-17 – 2011-04-18 (×5): 600 mg via ORAL
  Filled 2011-04-17 (×5): qty 1

## 2011-04-17 MED ORDER — DIPHENHYDRAMINE HCL 50 MG/ML IJ SOLN: 12.5000 mg | INTRAMUSCULAR | Status: DC | PRN

## 2011-04-17 MED ORDER — LACTATED RINGERS IV SOLN: 500.0000 mL | Freq: Once | INTRAVENOUS | Status: DC

## 2011-04-17 MED ORDER — OXYTOCIN 20 UNITS IN LACTATED RINGERS INFUSION - SIMPLE
1.0000 m[IU]/min | INTRAVENOUS | Status: DC
  Administered 2011-04-17: 2 m[IU]/min via INTRAVENOUS
  Filled 2011-04-17: qty 1000

## 2011-04-17 MED ORDER — MEASLES, MUMPS & RUBELLA VAC ~~LOC~~ INJ
0.5000 mL | INJECTION | Freq: Once | SUBCUTANEOUS | Status: DC
  Filled 2011-04-17: qty 0.5

## 2011-04-17 MED ORDER — LIDOCAINE HCL (PF) 1 % IJ SOLN
INTRAMUSCULAR | Status: DC | PRN
  Administered 2011-04-17 (×2): 5 mL

## 2011-04-17 MED ORDER — SENNOSIDES-DOCUSATE SODIUM 8.6-50 MG PO TABS
2.0000 | ORAL_TABLET | Freq: Every day | ORAL | Status: DC
  Administered 2011-04-17: 2 via ORAL

## 2011-04-17 MED ORDER — PRENATAL MULTIVITAMIN CH
1.0000 | ORAL_TABLET | Freq: Every day | ORAL | Status: DC
  Administered 2011-04-18: 1 via ORAL
  Filled 2011-04-17: qty 1

## 2011-04-17 MED ORDER — BENZOCAINE-MENTHOL 20-0.5 % EX AERO
1.0000 "application " | INHALATION_SPRAY | CUTANEOUS | Status: DC | PRN
  Administered 2011-04-18: 1 via TOPICAL

## 2011-04-17 MED ORDER — DIBUCAINE 1 % RE OINT: 1.0000 "application " | TOPICAL_OINTMENT | RECTAL | Status: DC | PRN

## 2011-04-17 NOTE — Anesthesia Postprocedure Evaluation (Signed)
  Anesthesia Post-op Note  Patient: Tanya [name]  Procedure(s) Performed: * No procedures listed *  Patient Location: PACU and Mother/Baby  Anesthesia Type: Epidural  Level of Consciousness: awake, alert  and oriented  Airway and Oxygen Therapy: Patient Spontanous Breathing  Post-op Pain: none  Post-op Assessment: Post-op Vital signs reviewed, Patient's Cardiovascular Status Stable, No headache, No backache, No residual numbness and No residual motor weakness  Post-op Vital Signs: Reviewed and stable  Complications: No apparent anesthesia complications

## 2011-04-17 NOTE — Anesthesia Preprocedure Evaluation (Signed)
Anesthesia Evaluation  Patient identified by MRN, date of birth, ID band Patient awake    Reviewed: Allergy & Precautions, H&P , Patient's Chart, lab work & pertinent test results  Airway Mallampati: II TM Distance: >3 FB Neck ROM: full    Dental No notable dental hx.    Pulmonary neg pulmonary ROS, shortness of breath,  breath sounds clear to auscultation  Pulmonary exam normal       Cardiovascular negative cardio ROS  Rhythm:regular Rate:Normal     Neuro/Psych negative neurological ROS  negative psych ROS   GI/Hepatic negative GI ROS, Neg liver ROS,   Endo/Other  negative endocrine ROS  Renal/GU negative Renal ROS     Musculoskeletal   Abdominal   Peds  Hematology negative hematology ROS (+)   Anesthesia Other Findings   Reproductive/Obstetrics (+) Pregnancy                           Anesthesia Physical Anesthesia Plan  ASA: II  Anesthesia Plan: Epidural   Post-op Pain Management:    Induction:   Airway Management Planned:   Additional Equipment:   Intra-op Plan:   Post-operative Plan:   Informed Consent: I have reviewed the patients History and Physical, chart, labs and discussed the procedure including the risks, benefits and alternatives for the proposed anesthesia with the patient or authorized representative who has indicated his/her understanding and acceptance.     Plan Discussed with:   Anesthesia Plan Comments:         Anesthesia Quick Evaluation

## 2011-04-17 NOTE — Anesthesia Procedure Notes (Signed)
Epidural Patient location during procedure: OB Start time: 04/17/2011 11:17 AM  Staffing Anesthesiologist: Brayton Caves R Performed by: anesthesiologist   Preanesthetic Checklist Completed: patient identified, site marked, surgical consent, pre-op evaluation, timeout performed, IV checked, risks and benefits discussed and monitors and equipment checked  Epidural Patient position: sitting Prep: site prepped and draped and DuraPrep Patient monitoring: continuous pulse ox and blood pressure Approach: midline Injection technique: LOR air and LOR saline  Needle:  Needle type: Tuohy  Needle gauge: 17 G Needle length: 9 cm Needle insertion depth: 5 cm cm Catheter type: closed end flexible Catheter size: 19 Gauge Catheter at skin depth: 10 cm Test dose: negative  Assessment Events: blood not aspirated, injection not painful, no injection resistance, negative IV test and no paresthesia  Additional Notes Patient identified.  Risk benefits discussed including failed block, incomplete pain control, headache, nerve damage, paralysis, blood pressure changes, nausea, vomiting, reactions to medication both toxic or allergic, and postpartum back pain.  Patient expressed understanding and wished to proceed.  All questions were answered.  Sterile technique used throughout procedure and epidural site dressed with sterile barrier dressing. No paresthesia or other complications noted.The patient did not experience any signs of intravascular injection such as tinnitus or metallic taste in mouth nor signs of intrathecal spread such as rapid motor block. Please see nursing notes for vital signs.

## 2011-04-17 NOTE — H&P (Signed)
Tanya [name] is a 31 y.o. female presenting for induction of labor due to recent parvo infection confirmed with lab IGm Seroconversion and followed with serial [place].  Also with polyhydraminos and prolonged labor sxs and fetal arrythmia with normal cardiac workup by  MFM . History OB History    Grav Para Term Preterm Abortions TAB SAB Ect Mult Living   2 1 1       1      Past Medical History  Diagnosis Date  . No pertinent past medical history   . Tachycardia   . History of chicken pox   . Tilted uterus   . Anemia     postpartum  . IBS (irritable bowel syndrome)   . History of pyelonephritis     as child  . ADHD (attention deficit hyperactivity disorder)    Past Surgical History  Procedure Date  . No past surgeries    Family History: family history includes Cancer in her maternal grandfather; Diabetes in her paternal grandfather; Hypertension in her mother; Hypothyroidism in her mother; Lupus in her paternal grandmother; and Thyroid disease in her mother. Social History:  reports that she has never smoked. She has never used smokeless tobacco. She reports that she does not drink alcohol or use illicit drugs.  ROS  Dilation: 2.5 Effacement (%): 50 Station: -2 Exam by:: Kenzo Ozment Blood pressure 129/71, pulse 84, temperature 97.8 F (36.6 C), temperature source Oral, resp. rate 20, height 5\' 8"  (1.727 m), weight 99.791 kg (220 lb). Exam Physical Exam  Prenatal labs: ABO, Rh: O/Positive/-- (08/07 0000) Antibody: Negative (08/07 0000) Rubella: Immune (08/07 0000) RPR: NON REACTIVE (03/04 2010)  HBsAg: Negative (08/07 0000)  HIV: Non-reactive (08/07 0000)  GBS: Negative (02/12 0000)   Assessment/Plan: IUP at term for induction due to parvo, polyhydraminos, fetal arrhythmia AROM, Pit, anticipate svd   Jayquon Theiler C 04/17/2011, 9:41 AM

## 2011-04-17 NOTE — Progress Notes (Signed)
Called to patient's bedside for precipitous delivery.  When I arrived, baby was crowning.  Patient delivered viable female with one push and without complication.  Dr Rana Snare arrived shortly after and assumed care of the patient.  Candelaria Celeste JEHIEL 04/17/2011 3:51 PM

## 2011-04-18 LAB — CBC
Hemoglobin: 10.5 g/dL — ABNORMAL LOW (ref 12.0–15.0)
MCHC: 33.2 g/dL (ref 30.0–36.0)
RBC: 3.53 MIL/uL — ABNORMAL LOW (ref 3.87–5.11)
WBC: 13.6 10*3/uL — ABNORMAL HIGH (ref 4.0–10.5)

## 2011-04-18 MED ORDER — OXYCODONE-ACETAMINOPHEN 5-325 MG PO TABS: 1.0000 | ORAL_TABLET | ORAL | Status: AC | PRN

## 2011-04-18 MED ORDER — BENZOCAINE-MENTHOL 20-0.5 % EX AERO
INHALATION_SPRAY | CUTANEOUS | Status: AC
  Filled 2011-04-18: qty 56

## 2011-04-18 MED ORDER — IBUPROFEN 600 MG PO TABS: 600.0000 mg | ORAL_TABLET | Freq: Four times a day (QID) | ORAL | Status: AC

## 2011-04-18 NOTE — Discharge Summary (Signed)
Obstetric Discharge Summary Reason for Admission: induction of labor Prenatal Procedures: ultrasound Intrapartum Procedures: spontaneous vaginal delivery Postpartum Procedures: none Complications-Operative and Postpartum: none Hemoglobin  Date Value Range Status  04/18/2011 10.5* 12.0-15.0 (g/dL) Final     HCT  Date Value Range Status  04/18/2011 31.6* 36.0-46.0 (%) Final    Discharge Diagnoses: Term Pregnancy-delivered  Discharge Information: Date: 04/18/2011 Activity: pelvic rest Diet: routine Medications: PNV, Ibuprofen and Percocet Condition: stable Instructions: refer to practice specific booklet Discharge to: home Follow-up Information    Follow up in 6 weeks.         Newborn Data: Live born female  Birth Weight: 8 lb 6.2 oz (3805 g) APGAR: 9, 9  Home with mother.  Noreen Mackintosh II,Alexyia Guarino E 04/18/2011, 7:53 AM

## 2011-04-18 NOTE — Progress Notes (Signed)
PPD #1 No C/O Good pain relief, decreasing lochia  Blood pressure 88/59, pulse 67, temperature 98.1 F (36.7 C), temperature source Oral, resp. rate 18, height 5\' 8"  (1.727 m), weight 99.791 kg (220 lb), SpO2 98.00%, unknown if currently breastfeeding.  FFNT  Results for orders placed during the hospital encounter of 04/16/11 (from the past 24 hour(s))  CBC     Status: Abnormal   Collection Time   04/18/11  5:35 AM      Component Value Range   WBC 13.6 (*) 4.0 - 10.5 (K/uL)   RBC 3.53 (*) 3.87 - 5.11 (MIL/uL)   Hemoglobin 10.5 (*) 12.0 - 15.0 (g/dL)   HCT 16.1 (*) 09.6 - 46.0 (%)   MCV 89.5  78.0 - 100.0 (fL)   MCH 29.7  26.0 - 34.0 (pg)   MCHC 33.2  30.0 - 36.0 (g/dL)   RDW 04.5  40.9 - 81.1 (%)   Platelets 190  150 - 400 (K/uL)   A: satisfactory   P: Pt wants to go home today      Will D/C at 1530  (24 hours PP)      Instructions given      Percocet 5.325 #20

## 2011-04-19 NOTE — Progress Notes (Signed)
UR Chart review completed.  

## 2011-04-23 ENCOUNTER — Encounter (HOSPITAL_COMMUNITY): Payer: BC Managed Care – PPO

## 2011-07-23 ENCOUNTER — Encounter (HOSPITAL_COMMUNITY): Payer: Self-pay

## 2011-07-23 ENCOUNTER — Emergency Department (HOSPITAL_COMMUNITY)
Admission: EM | Admit: 2011-07-23 | Discharge: 2011-07-23 | Disposition: A | Discharge status: Home / Self Care | Payer: BC Managed Care – PPO | Source: Home / Self Care | Attending: Emergency Medicine | Admitting: Emergency Medicine

## 2011-07-23 DIAGNOSIS — N3000 Acute cystitis without hematuria: Secondary | ICD-10-CM

## 2011-07-23 LAB — POCT URINALYSIS DIP (DEVICE)
Bilirubin Urine: NEGATIVE
Glucose, UA: NEGATIVE mg/dL
Ketones, ur: NEGATIVE mg/dL
Nitrite: POSITIVE — AB

## 2011-07-23 MED ORDER — CEPHALEXIN 500 MG PO CAPS: 500.0000 mg | ORAL_CAPSULE | Freq: Three times a day (TID) | ORAL | Status: AC

## 2011-07-23 MED ORDER — PHENAZOPYRIDINE HCL 200 MG PO TABS: 200.0000 mg | ORAL_TABLET | Freq: Three times a day (TID) | ORAL | Status: AC | PRN

## 2011-07-23 NOTE — ED Provider Notes (Signed)
Chief Complaint  Patient presents with  . Urinary Tract Infection    History of Present Illness:   The patient is a 31 year old female who presents today with a three-day history of dysuria, frequency, and urgency. She denies any hematuria. She's had no fever, or chills. She has had some nausea but no vomiting. She's had some suprapubic pain and pain in her left flank area. She denies any GYN symptoms. She has had urinary tract infections before, most recent one being 1-2 years ago.  Review of Systems:  Other than noted above, the patient denies any of the following symptoms: General:  No fevers, chills, sweats, aches, or fatigue. GI:  No abdominal pain, back pain, nausea, vomiting, diarrhea, or constipation. GU:  No dysuria, frequency, urgency, hematuria, or incontinence. GYN:  No discharge, itching, vulvar pain or lesions, pelvic pain, or abnormal vaginal bleeding.  PMFSH:  Past medical history, family history, social history, meds, and allergies were reviewed.  Physical Exam:   Vital signs:  BP 121/82  Pulse 65  Temp(Src) 98.1 F (36.7 C) (Oral)  Resp 18  SpO2 100%  LMP 06/27/2011  Breastfeeding? Unknown Gen:  Alert, oriented, in no distress. Lungs:  Clear to auscultation, no wheezes, rales or rhonchi. Heart:  Regular rhythm, no gallop or murmer. Abdomen:  Flat and soft. There was slight suprapubic pain to palpation.  No guarding, or rebound.  No hepato-splenomegaly or mass.  Bowel sounds were normally active.  No hernia. Back:  No CVA tenderness.  Skin:  Clear, warm and dry.  Labs:   Results for orders placed during the hospital encounter of 07/23/11  POCT URINALYSIS DIP (DEVICE)      Component Value Range   Glucose, UA NEGATIVE  NEGATIVE (mg/dL)   Bilirubin Urine NEGATIVE  NEGATIVE    Ketones, ur NEGATIVE  NEGATIVE (mg/dL)   Specific Gravity, Urine 1.010  1.005 - 1.030    Hgb urine dipstick TRACE (*) NEGATIVE    pH 7.5  5.0 - 8.0    Protein, ur NEGATIVE  NEGATIVE (mg/dL)    Urobilinogen, UA 0.2  0.0 - 1.0 (mg/dL)   Nitrite POSITIVE (*) NEGATIVE    Leukocytes, UA MODERATE (*) NEGATIVE   POCT PREGNANCY, URINE      Component Value Range   Preg Test, Ur NEGATIVE  NEGATIVE      Assessment: The encounter diagnosis was Acute cystitis.   Plan:   1.  The following meds were prescribed:   New Prescriptions   CEPHALEXIN (KEFLEX) 500 MG CAPSULE    Take 1 capsule (500 mg total) by mouth 3 (three) times daily.   PHENAZOPYRIDINE (PYRIDIUM) 200 MG TABLET    Take 1 tablet (200 mg total) by mouth 3 (three) times daily as needed for pain.   2.  The patient was instructed in symptomatic care and handouts were given. 3.  The patient was told to return if becoming worse in any way, if no better in 3 or 4 days, and given some red flag symptoms that would indicate earlier return. 4.  The patient was told to avoid intercourse for 10 days, get extra fluids, and return for a follow up with her primary care doctor at the completion of treatment for a repeat UA and culture.     Reuben Likes, MD 07/23/11 [phone]

## 2011-07-23 NOTE — Discharge Instructions (Signed)

## 2011-07-23 NOTE — ED Notes (Signed)
Urinary frequency, urgency, burning for past  Few days;  Denies any STD concerns

## 2011-07-24 LAB — URINE CULTURE: Culture  Setup Time: 201306101558

## 2011-07-25 NOTE — ED Notes (Signed)
Urine Culture: >100,000 colonies E. Coli. Pt. adequately treated with Keflex. Vassie Moselle 07/25/2011

## 2013-03-23 ENCOUNTER — Encounter: Payer: Self-pay | Admitting: Emergency Medicine

## 2013-03-23 ENCOUNTER — Emergency Department (INDEPENDENT_AMBULATORY_CARE_PROVIDER_SITE_OTHER)
Admission: EM | Admit: 2013-03-23 | Discharge: 2013-03-23 | Disposition: A | Discharge status: Home / Self Care | Payer: 59 | Source: Home / Self Care | Attending: Family Medicine | Admitting: Family Medicine

## 2013-03-23 DIAGNOSIS — J069 Acute upper respiratory infection, unspecified: Secondary | ICD-10-CM

## 2013-03-23 HISTORY — DX: Other seasonal allergic rhinitis: J30.2

## 2013-03-23 MED ORDER — BENZONATATE 200 MG PO CAPS: 200.0000 mg | ORAL_CAPSULE | Freq: Every day | ORAL | Status: DC

## 2013-03-23 MED ORDER — AZITHROMYCIN 250 MG PO TABS: ORAL_TABLET | ORAL | Status: DC

## 2013-03-23 NOTE — Discharge Instructions (Signed)
Take plain Mucinex (1200 mg guaifenesin) twice daily for cough and congestion. Increase fluid intake, rest. May use Afrin nasal spray (or generic oxymetazoline) twice daily for about 5 days.  Also recommend using saline nasal spray several times daily and saline nasal irrigation (AYR is a common brand) Try warm salt water gargles for sore throat.  Stop all antihistamines for now, and other non-prescription cough/cold preparations. May take Ibuprofen 200mg , 4 tabs every 8 hours with food for chest/sternum discomfort. Begin Azithromycin if not improving about 5 days or if persistent fever develops   Follow-up with family doctor if not improving 7 to 10 days.

## 2013-03-23 NOTE — ED Provider Notes (Signed)
CSN: [id number]     Arrival date & time 03/23/13  1426 History   First MD Initiated Contact with Patient 03/23/13 1446     Chief Complaint  Patient presents with  . Generalized Body Aches  . Sore Throat  . Cough        HPI Comments: Patient reports that she had a URI about 6 to 7 weeks ago that finally cleared about 3 weeks ago, although she has had some mild persistent sinus congestion. One week ago she developed increased sinus congestion, mild sore throat, non-productive cough, fatigue, and anterior chest tightness.  She states that her cough has actually improved and she feels better. Patient states that two days ago while sitting watching TV she had an episode of rapid heart rate to about 180, associated with shortness of breath and tingling in her fingers.  The episode resolved and has not recurred.  She states that she has had similar episodes in the past, with a negative EKG.  She has been evaluated for this in the past with an event monitor which showed negative results.  The history is provided by the patient.    Past Medical History  Diagnosis Date  . No pertinent past medical history   . Tachycardia   . History of chicken pox   . Tilted uterus   . Anemia     postpartum  . IBS (irritable bowel syndrome)   . History of pyelonephritis     as child  . ADHD (attention deficit hyperactivity disorder)   . Seasonal allergies    Past Surgical History  Procedure Laterality Date  . No past surgeries     Family History  Problem Relation Age of Onset  . Hypertension Mother   . Hypothyroidism Mother   . Thyroid disease Mother   . Cancer Maternal Grandfather     breast  . Lupus Paternal Grandmother   . Diabetes Paternal Grandfather    History  Substance Use Topics  . Smoking status: Never Smoker   . Smokeless tobacco: Never Used  . Alcohol Use: No   OB History   Grav Para Term Preterm Abortions TAB SAB Ect Mult Living   2 2 2       2      Review of Systems + sore  throat + cough No pleuritic pain + wheezing + nasal congestion + post-nasal drainage No sinus pain/pressure No itchy/red eyes No earache No hemoptysis No SOB No fever/chills No nausea No vomiting No abdominal pain No diarrhea No urinary symptoms No skin rash + fatigue No myalgias No headache Used OTC meds without relief    Allergies  Review of patient's allergies indicates no known allergies.  Home Medications   Current Outpatient Rx  Name  Route  Sig  Dispense  Refill  . amphetamine-dextroamphetamine (ADDERALL) 10 MG tablet   Oral   Take 10 mg by mouth daily with breakfast.         . azithromycin (ZITHROMAX Z-PAK) 250 MG tablet      Take 2 tabs today; then begin one tab once daily for 4 more days. (Rx void after 03/31/13)   6 each   0   . benzonatate (TESSALON) 200 MG capsule   Oral   Take 1 capsule (200 mg total) by mouth at bedtime. Take as needed for cough   12 capsule   0    BP 112/73  Pulse 97  Temp(Src) 98.4 F (36.9 C) (Oral)  Resp 16  Ht  5\' 8"  (1.727 m)  Wt 171 lb (77.565 kg)  BMI 26.01 kg/m2  SpO2 100%  LMP 03/13/2013 Physical Exam Nursing notes and Vital Signs reviewed. Appearance:  Patient appears healthy, stated age, and in no acute distress Eyes:  Pupils are equal, round, and reactive to light and accomodation.  Extraocular movement is intact.  Conjunctivae are not inflamed  Ears:  Canals normal.  Tympanic membranes normal.  Nose:  Mildly congested turbinates.  No sinus tenderness.   Pharynx:  Normal Neck:  Supple.  Non-tender shotty posterior nodes are palpated bilaterally  Lungs:  Clear to auscultation.  Breath sounds are equal.  Chest:  Distinct tenderness to palpation over the mid-sternum.  Heart:  Regular rate and rhythm without murmurs, rubs, or gallops.  Rate 88  Abdomen:  Nontender without masses or hepatosplenomegaly.  Bowel sounds are present.  No CVA or flank tenderness.  Extremities:  No edema.  No calf tenderness Skin:   No rash present.   ED Course  Procedures  none       MDM   Final diagnoses:  Acute upper respiratory infections of unspecified site; suspect viral URI     There is no evidence of bacterial infection today.  Treat symptomatically for now: Prescription written for Benzonatate [facility]) to take at bedtime for night-time cough.  Take plain Mucinex (1200 mg guaifenesin) twice daily for cough and congestion. Increase fluid intake, rest. May use Afrin nasal spray (or generic oxymetazoline) twice daily for about 5 days.  Also recommend using saline nasal spray several times daily and saline nasal irrigation (AYR is a common brand) Try warm salt water gargles for sore throat.  Stop all antihistamines for now, and other non-prescription cough/cold preparations. May take Ibuprofen 200mg , 4 tabs every 8 hours with food for chest/sternum discomfort. Begin Azithromycin if not improving about 5 days or if persistent fever develops (Given a prescription to hold, with an expiration date)  Follow-up with family doctor if not improving 7 to 10 days.    Lattie Haw, MD 03/23/13 [phone]

## 2013-03-23 NOTE — ED Notes (Signed)
Pt c/o nasal congestion with thick green nasal drainage, fatigue, and cough x 1 wk. Denies fever. She also c/o an episode 2 days ago of increased heart rate, dizziness and feeling tingling all over. She reports the episode was brief and has not happened again.

## 2013-04-23 ENCOUNTER — Emergency Department (INDEPENDENT_AMBULATORY_CARE_PROVIDER_SITE_OTHER): Payer: 59

## 2013-04-23 ENCOUNTER — Emergency Department (INDEPENDENT_AMBULATORY_CARE_PROVIDER_SITE_OTHER)
Admission: EM | Admit: 2013-04-23 | Discharge: 2013-04-23 | Disposition: A | Discharge status: Home / Self Care | Payer: 59 | Source: Home / Self Care | Attending: Family Medicine | Admitting: Family Medicine

## 2013-04-23 ENCOUNTER — Encounter: Payer: Self-pay | Admitting: Emergency Medicine

## 2013-04-23 DIAGNOSIS — R109 Unspecified abdominal pain: Secondary | ICD-10-CM

## 2013-04-23 DIAGNOSIS — K219 Gastro-esophageal reflux disease without esophagitis: Secondary | ICD-10-CM

## 2013-04-23 LAB — COMPREHENSIVE METABOLIC PANEL
ALBUMIN: 4.5 g/dL (ref 3.5–5.2)
ALK PHOS: 54 U/L (ref 39–117)
ALT: 12 U/L (ref 0–35)
AST: 14 U/L (ref 0–37)
BILIRUBIN TOTAL: 0.4 mg/dL (ref 0.2–1.2)
BUN: 16 mg/dL (ref 6–23)
CO2: 27 mEq/L (ref 19–32)
Calcium: 9.2 mg/dL (ref 8.4–10.5)
Chloride: 104 mEq/L (ref 96–112)
Creat: 0.78 mg/dL (ref 0.50–1.10)
GLUCOSE: 78 mg/dL (ref 70–99)
POTASSIUM: 4.4 meq/L (ref 3.5–5.3)
Sodium: 137 mEq/L (ref 135–145)
Total Protein: 7.1 g/dL (ref 6.0–8.3)

## 2013-04-23 LAB — POCT URINALYSIS DIP (MANUAL ENTRY)
BILIRUBIN UA: NEGATIVE
BILIRUBIN UA: NEGATIVE
GLUCOSE UA: NEGATIVE
Leukocytes, UA: NEGATIVE
Nitrite, UA: NEGATIVE
Protein Ur, POC: NEGATIVE
RBC UA: NEGATIVE
SPEC GRAV UA: 1.015 (ref 1.005–1.03)
Urobilinogen, UA: 0.2 (ref 0–1)
pH, UA: 6.5 (ref 5–8)

## 2013-04-23 MED ORDER — CEPHALEXIN 500 MG PO CAPS: 500.0000 mg | ORAL_CAPSULE | Freq: Three times a day (TID) | ORAL | Status: DC

## 2013-04-23 MED ORDER — IOHEXOL 300 MG/ML  SOLN
100.0000 mL | Freq: Once | INTRAMUSCULAR | Status: AC | PRN
  Administered 2013-04-23: 100 mL via INTRAVENOUS

## 2013-04-23 MED ORDER — OMEPRAZOLE 20 MG PO CPDR: 20.0000 mg | DELAYED_RELEASE_CAPSULE | Freq: Every day | ORAL | Status: DC

## 2013-04-23 MED ORDER — KETOROLAC TROMETHAMINE 30 MG/ML IJ SOLN
30.0000 mg | Freq: Once | INTRAMUSCULAR | Status: AC
  Administered 2013-04-23: 30 mg via INTRAMUSCULAR

## 2013-04-23 NOTE — ED Notes (Signed)
Rt rib pain started last night

## 2013-04-23 NOTE — ED Provider Notes (Addendum)
CSN: [id number]     Arrival date & time 04/23/13  0809 History   First MD Initiated Contact with Patient 04/23/13 [phone]     Chief Complaint  Patient presents with  . Back Pain    HPI  Pt presents today with chief complaint of R upper back/flank pain  Has been present since yesterday.  Has had similar episodes in the past 2/2 UTI.  Prior hx/o renal reflux on R side as a child s/p repair  Also with R sided renal cyst that was benign per pt.  No recent strenuous activity.  No dysuria, increased urinary frequency.  Mild pain was reproduced with urination today.  No prior hx/o kidney stones.  Does also report some mild RUQ abd pain. Worse with eating.  No fevers or chills.  Unclear if sxs may be related to her GB.  + prior hx/o GERD. Seems to be flaring mildly recently.  Overall pain 6/10 at its worst.   Past Medical History  Diagnosis Date  . No pertinent past medical history   . Tachycardia   . History of chicken pox   . Tilted uterus   . Anemia     postpartum  . IBS (irritable bowel syndrome)   . History of pyelonephritis     as child  . ADHD (attention deficit hyperactivity disorder)   . Seasonal allergies    Past Surgical History  Procedure Laterality Date  . No past surgeries     Family History  Problem Relation Age of Onset  . Hypertension Mother   . Hypothyroidism Mother   . Thyroid disease Mother   . Cancer Maternal Grandfather     breast  . Lupus Paternal Grandmother   . Diabetes Paternal Grandfather    History  Substance Use Topics  . Smoking status: Never Smoker   . Smokeless tobacco: Never Used  . Alcohol Use: No   OB History   Grav Para Term Preterm Abortions TAB SAB Ect Mult Living   2 2 2       2      Review of Systems  All other systems reviewed and are negative.    Allergies  Review of patient's allergies indicates not on file.  Home Medications   Current Outpatient Rx  Name  Route  Sig  Dispense  Refill  . nitrofurantoin,  macrocrystal-monohydrate, (MACROBID) 100 MG capsule   Oral   Take 100 mg by mouth 2 (two) times daily.         Marland Kitchen. amphetamine-dextroamphetamine (ADDERALL) 10 MG tablet   Oral   Take 10 mg by mouth daily with breakfast.         . azithromycin (ZITHROMAX Z-PAK) 250 MG tablet      Take 2 tabs today; then begin one tab once daily for 4 more days. (Rx void after 03/31/13)   6 each   0   . benzonatate (TESSALON) 200 MG capsule   Oral   Take 1 capsule (200 mg total) by mouth at bedtime. Take as needed for cough   12 capsule   0    BP 110/71  Pulse 83  Temp(Src) 98.2 F (36.8 C) (Oral)  Ht 5\' 7"  (1.702 m)  Wt 170 lb (77.111 kg)  BMI 26.62 kg/m2  SpO2 100% Physical Exam  Constitutional: She appears well-developed and well-nourished.  HENT:  Head: Normocephalic and atraumatic.  Eyes: Conjunctivae are normal. Pupils are equal, round, and reactive to light.  Neck: Normal range of motion. Neck supple.  Cardiovascular: Normal rate and regular rhythm.   Pulmonary/Chest: Effort normal and breath sounds normal.  Abdominal: Soft. Bowel sounds are normal.    Faint/mild TTP over affected area. No rebound or guarding.    Musculoskeletal:       Back:  + Mild TTP over affected area    ED Course  Procedures (including critical care time) Labs Review Labs Reviewed - No data to display Imaging Review No results found.   MDM   1. Flank pain    UA bland today. No blood or signs of infection.  Afebrile. Sxs fairly mild.  KUB WNL.  Given ? GB involvement, will obtain CT abd and pelvis to better assess anatomy/biliary disease.  Will start on UTI treatment with keflex.  Urine culture Will also check CMET.  Toradol 30mg  IM x1 on initial arrival.  Follow up pending CT scan.    The patient and/or caregiver has been counseled thoroughly with regard to treatment plan and/or medications prescribed including dosage, schedule, interactions, rationale for use, and possible side  effects and they verbalize understanding. Diagnoses and expected course of recovery discussed and will return if not improved as expected or if the condition worsens. Patient and/or caregiver verbalized understanding.          Doree Albee, MD 04/23/13 0924   Dg Abd 1 View  04/23/2013   CLINICAL DATA:  Right flank pain  EXAM: ABDOMEN - 1 VIEW  COMPARISON:  CT abdomen pelvis of 07/12/2003  FINDINGS: A supine film of the abdomen shows a nonspecific bowel gas pattern. No opaque renal or ureteral calculi are noted by plain film. An IUD is noted in the midline of the pelvis. No bony abnormality is seen.  IMPRESSION: No opaque renal or ureteral calculi.  Nonspecific bowel gas pattern.   Electronically Signed   By: Dwyane Dee M.D.   On: 04/23/2013 09:03   Ct Abdomen Pelvis W Contrast  04/23/2013   CLINICAL DATA:  Right flank pain  EXAM: CT ABDOMEN AND PELVIS WITH CONTRAST  TECHNIQUE: Multidetector CT imaging of the abdomen and pelvis was performed using the standard protocol following bolus administration of intravenous contrast.  CONTRAST:  OMNIPAQUE IOHEXOL 300 MG/ML  SOLN  COMPARISON:  None.  FINDINGS: The lung bases are clear.  The liver demonstrates no focal abnormality. There is no intrahepatic or extrahepatic biliary ductal dilatation. The gallbladder is normal. The spleen demonstrates no focal abnormality. There is a small area of left upper pole renal cortical scarring. The right kidney, adrenal glands and pancreas are normal. The bladder is unremarkable.  The stomach, duodenum, small intestine, and large intestine demonstrate no contrast extravasation or dilatation. There is a normal caliber appendix in the right lower quadrant without periappendiceal inflammatory changes.There is a small fat containing umbilical hernia. There is no pneumoperitoneum, pneumatosis, or portal venous gas. There is no abdominal or pelvic free fluid. There is no lymphadenopathy. There is a T-type IUD noted.  There is no adnexal mass.  The abdominal aorta is normal in caliber.  There are no lytic or sclerotic osseous lesions.  IMPRESSION: No acute abdominal or pelvic pathology.   Electronically Signed   By: Elige Ko   On: 04/23/2013 10:14   Clinical Update: CT scan negative for any intra-abdominal process Proceed with UTI treatment.  Start on PPI  Discussed infectious/GI/GU red flags.  Follow up as needed.     Doree Albee, MD 04/23/13 1039

## 2013-04-25 LAB — URINE CULTURE
COLONY COUNT: NO GROWTH
Organism ID, Bacteria: NO GROWTH

## 2013-04-28 ENCOUNTER — Telehealth: Payer: Self-pay | Admitting: *Deleted

## 2013-04-28 NOTE — Telephone Encounter (Signed)
Message copied by Tami RibasLOCKLEAR, Teriyah Purington B on Tue Apr 28, 2013  3:39 PM ------      Message from: Floydene FlockNEWTON, STEVEN J      Created: Fri Apr 24, 2013 12:08 AM       Labs WNL       Please inform pt.        ------

## 2014-02-12 HISTORY — PX: OTHER SURGICAL HISTORY: SHX169

## 2014-11-25 DIAGNOSIS — N9489 Other specified conditions associated with female genital organs and menstrual cycle: Secondary | ICD-10-CM | POA: Insufficient documentation

## 2015-04-04 ENCOUNTER — Inpatient Hospital Stay (HOSPITAL_COMMUNITY)
Admission: AD | Admit: 2015-04-04 | Discharge: 2015-04-04 | Disposition: A | Discharge status: Home / Self Care | Payer: BLUE CROSS/BLUE SHIELD | Source: Ambulatory Visit | Attending: Obstetrics and Gynecology | Admitting: Obstetrics and Gynecology

## 2015-04-04 ENCOUNTER — Encounter (HOSPITAL_COMMUNITY): Payer: Self-pay | Admitting: *Deleted

## 2015-04-04 DIAGNOSIS — K529 Noninfective gastroenteritis and colitis, unspecified: Secondary | ICD-10-CM | POA: Diagnosis not present

## 2015-04-04 DIAGNOSIS — R109 Unspecified abdominal pain: Secondary | ICD-10-CM | POA: Diagnosis present

## 2015-04-04 DIAGNOSIS — O26892 Other specified pregnancy related conditions, second trimester: Secondary | ICD-10-CM | POA: Insufficient documentation

## 2015-04-04 DIAGNOSIS — E860 Dehydration: Secondary | ICD-10-CM | POA: Insufficient documentation

## 2015-04-04 DIAGNOSIS — F909 Attention-deficit hyperactivity disorder, unspecified type: Secondary | ICD-10-CM | POA: Insufficient documentation

## 2015-04-04 DIAGNOSIS — K219 Gastro-esophageal reflux disease without esophagitis: Secondary | ICD-10-CM | POA: Insufficient documentation

## 2015-04-04 DIAGNOSIS — R197 Diarrhea, unspecified: Secondary | ICD-10-CM | POA: Diagnosis present

## 2015-04-04 HISTORY — DX: Gastro-esophageal reflux disease without esophagitis: K21.9

## 2015-04-04 LAB — URINALYSIS, ROUTINE W REFLEX MICROSCOPIC
Bilirubin Urine: NEGATIVE
Glucose, UA: NEGATIVE mg/dL
Hgb urine dipstick: NEGATIVE
KETONES UR: NEGATIVE mg/dL
LEUKOCYTES UA: NEGATIVE
NITRITE: NEGATIVE
PH: 6 (ref 5.0–8.0)
Protein, ur: NEGATIVE mg/dL
SPECIFIC GRAVITY, URINE: 1.02 (ref 1.005–1.030)

## 2015-04-04 LAB — POCT PREGNANCY, URINE: PREG TEST UR: POSITIVE — AB

## 2015-04-04 MED ORDER — PROMETHAZINE HCL 25 MG PO TABS: 25.0000 mg | ORAL_TABLET | Freq: Four times a day (QID) | ORAL | Status: DC | PRN

## 2015-04-04 MED ORDER — PANTOPRAZOLE SODIUM 20 MG PO TBEC: 20.0000 mg | DELAYED_RELEASE_TABLET | Freq: Every day | ORAL | Status: DC

## 2015-04-04 MED ORDER — PANTOPRAZOLE SODIUM 40 MG IV SOLR
40.0000 mg | Freq: Once | INTRAVENOUS | Status: AC
  Administered 2015-04-04: 40 mg via INTRAVENOUS
  Filled 2015-04-04: qty 40

## 2015-04-04 MED ORDER — DEXTROSE IN LACTATED RINGERS 5 % IV SOLN
INTRAVENOUS | Status: DC
  Administered 2015-04-04: 12:00:00 via INTRAVENOUS

## 2015-04-04 NOTE — MAU Provider Note (Signed)
Chief Complaint:  No chief complaint on file.   First Provider Initiated Contact with Patient 04/04/15 1115     HPI  Tanya [name] is a 35 y.o. [id number] at 43w3dwho presents to maternity admissions reporting abdominal cramping, nausea and intermittent loose stools. Her son had norovirus last week she thinks she got it then and "it is just still stuck in my stomach".  Had several loose stools a few days ago and only 2 yesterday and one today.  Also has a lot of acid reflux, for which she used to take Protonix (but stopped since UPT).  Also has hx IBS/mixed. She reports denies LOF, vaginal bleeding, vaginal itching/burning, urinary symptoms, h/a, dizziness, constipation or fever/chills.  She denies headache, visual changes.  RN note: Son was sick with norovirus. She was having stomach pain, and nausea (has not vomited), had diarrhea (4-5 times in 5 days). Having bad reflux, acid/indigestion.          Past Medical History: Past Medical History  Diagnosis Date  . No pertinent past medical history   . Tachycardia   . History of chicken pox   . Tilted uterus   . Anemia     postpartum  . IBS (irritable bowel syndrome)   . History of pyelonephritis     as child  . ADHD (attention deficit hyperactivity disorder)   . Seasonal allergies   . GERD (gastroesophageal reflux disease)     Past obstetric history: OB History  Gravida Para Term Preterm AB SAB TAB Ectopic Multiple Living  # Outcome Date GA Lbr Len/2nd Weight Sex Delivery Anes PTL Lv  5 Current           4 Term 04/17/11 [redacted]w[redacted]d 05:28 / 01:15 8 lb 6.2 oz (3.805 kg) M Vag-Spont EPI  Y     Comments: caput, moulding  3 Term 11/23/04 [redacted]w[redacted]d 12:00 8 lb 3 oz (3.714 kg) F Vag-Spont EPI N Y  2 SAB           1 SAB               Past Surgical History: Past Surgical History  Procedure Laterality Date  . No past surgeries      Family History: Family History  Problem Relation Age of Onset  . Hypertension Mother    . Hypothyroidism Mother   . Thyroid disease Mother   . Cancer Maternal Grandfather     breast  . Lupus Paternal Grandmother   . Diabetes Paternal Grandfather     Social History: Social History  Substance Use Topics  . Smoking status: Never Smoker   . Smokeless tobacco: Never Used  . Alcohol Use: No    Allergies: No Known Allergies  Meds:  Prescriptions prior to admission  Medication Sig Dispense Refill Last Dose  . amphetamine-dextroamphetamine (ADDERALL) 10 MG tablet Take 10 mg by mouth daily with breakfast.     . azithromycin (ZITHROMAX Z-PAK) 250 MG tablet Take 2 tabs today; then begin one tab once daily for 4 more days. (Rx void after 03/31/13) 6 each 0   . benzonatate (TESSALON) 200 MG capsule Take 1 capsule (200 mg total) by mouth at bedtime. Take as needed for cough 12 capsule 0   . cephALEXin (KEFLEX) 500 MG capsule Take 1 capsule (500 mg total) by mouth 3 (three) times daily. 21 capsule 0   . nitrofurantoin, macrocrystal-monohydrate, (MACROBID) 100 MG capsule Take 100  mg by mouth 2 (two) times daily.     Marland Kitchen omeprazole (PRILOSEC) 20 MG capsule Take 1 capsule (20 mg total) by mouth daily. 30 capsule 2     I have reviewed patient's Past Medical Hx, Surgical Hx, Family Hx, Social Hx, medications and allergies.   ROS:  Review of Systems  Constitutional: Negative for fever and chills.  HENT: Negative for sinus pressure and sore throat.   Respiratory: Negative for cough.   Gastrointestinal: Positive for nausea, abdominal pain and diarrhea. Negative for vomiting and constipation.  Genitourinary: Negative for vaginal bleeding.  Musculoskeletal: Negative for back pain.  Neurological: Negative for dizziness.  Other systems negative   Physical Exam  Patient Vitals for the past 24 hrs:  BP Temp Temp src Pulse Resp Weight  04/04/15 1054 116/66 mmHg 98.5 F (36.9 C) Oral 92 18 186 lb 6.4 oz (84.55 kg)   Constitutional: Well-developed female in no acute distress.   Cardiovascular: normal rate and rhythm Respiratory: normal effort, clear to auscultation bilaterally GI: Abd soft, non-tender, gravid appropriate for gestational age.   No rebound or guarding. MS: Extremities nontender, no edema, normal ROM Neurologic: Alert and oriented x 4.  GU: Neg CVAT.    FHT: 152    Labs: Results for orders placed or performed during the hospital encounter of 04/04/15 (from the past 24 hour(s))  Urinalysis, Routine w reflex microscopic (not at [facility])     Status: Abnormal   Collection Time: 04/04/15 11:00 AM  Result Value Ref Range   Color, Urine YELLOW YELLOW   APPearance CLOUDY (A) CLEAR   Specific Gravity, Urine 1.020 1.005 - 1.030   pH 6.0 5.0 - 8.0   Glucose, UA NEGATIVE NEGATIVE mg/dL   Hgb urine dipstick NEGATIVE NEGATIVE   Bilirubin Urine NEGATIVE NEGATIVE   Ketones, ur NEGATIVE NEGATIVE mg/dL   Protein, ur NEGATIVE NEGATIVE mg/dL   Nitrite NEGATIVE NEGATIVE   Leukocytes, UA NEGATIVE NEGATIVE  Pregnancy, urine POC     Status: Abnormal   Collection Time: 04/04/15 11:03 AM  Result Value Ref Range   Preg Test, Ur POSITIVE (A) NEGATIVE     Imaging:  No results found.  MAU Course/MDM: I have ordered labs and reviewed results.  Consult Dr Renaldo Fiddler with presentation, exam findings and test results. She recommends rehydration with IV fluids. Treatments in MAU included IV hydration.    Assessment: SIUP at [redacted]w[redacted]d  Gastroenteritis, resolving Mild dehydration  Plan: Discharge home Push PO fluids as tolerated Rx phenergan for nausea Rx Protonix for GERD Follow up in Office for prenatal visits and recheck of status    Medication List    ASK your doctor about these medications        amphetamine-dextroamphetamine 10 MG tablet  Commonly known as:  ADDERALL  Take 10 mg by mouth daily with breakfast.     azithromycin 250 MG tablet  Commonly known as:  ZITHROMAX Z-PAK  Take 2 tabs today; then begin one tab once daily for 4 more days. (Rx void  after 03/31/13)     benzonatate 200 MG capsule  Commonly known as:  TESSALON  Take 1 capsule (200 mg total) by mouth at bedtime. Take as needed for cough     cephALEXin 500 MG capsule  Commonly known as:  KEFLEX  Take 1 capsule (500 mg total) by mouth 3 (three) times daily.     nitrofurantoin (macrocrystal-monohydrate) 100 MG capsule  Commonly known as:  MACROBID  Take 100 mg by mouth 2 (two)  times daily.     omeprazole 20 MG capsule  Commonly known as:  PRILOSEC  Take 1 capsule (20 mg total) by mouth daily.       Pt stable at time of discharge.  Wynelle Bourgeois CNM, MSN Certified Nurse-Midwife 04/04/2015 11:39 AM

## 2015-04-04 NOTE — MAU Note (Signed)
Son was sick with norovirus.  She was having stomach pain, and nausea (has not vomited), had diarrhea (4-5 times in 5 days).  Having bad reflux, acid/indigestion.

## 2015-04-04 NOTE — Discharge Instructions (Signed)
Gastroesophageal Reflux Disease, Adult Normally, food travels down the esophagus and stays in the stomach to be digested. However, when a person has gastroesophageal reflux disease (GERD), food and stomach acid move back up into the esophagus. When this happens, the esophagus becomes sore and inflamed. Over time, GERD can create small holes (ulcers) in the lining of the esophagus.  CAUSES This condition is caused by a problem with the muscle between the esophagus and the stomach (lower esophageal sphincter, or LES). Normally, the LES muscle closes after food passes through the esophagus to the stomach. When the LES is weakened or abnormal, it does not close properly, and that allows food and stomach acid to go back up into the esophagus. The LES can be weakened by certain dietary substances, medicines, and medical conditions, including:  Tobacco use.  Pregnancy.  Having a hiatal hernia.  Heavy alcohol use.  Certain foods and beverages, such as coffee, chocolate, onions, and peppermint. RISK FACTORS This condition is more likely to develop in:  People who have an increased body weight.  People who have connective tissue disorders.  People who use NSAID medicines. SYMPTOMS Symptoms of this condition include:  Heartburn.  Difficult or painful swallowing.  The feeling of having a lump in the throat.  Abitter taste in the mouth.  Bad breath.  Having a large amount of saliva.  Having an upset or bloated stomach.  Belching.  Chest pain.  Shortness of breath or wheezing.  Ongoing (chronic) cough or a night-time cough.  Wearing away of tooth enamel.  Weight loss. Different conditions can cause chest pain. Make sure to see your health care provider if you experience chest pain. DIAGNOSIS Your health care provider will take a medical history and perform a physical exam. To determine if you have mild or severe GERD, your health care provider may also monitor how you respond  to treatment. You may also have other tests, including:  An endoscopy toexamine your stomach and esophagus with a small camera.  A test thatmeasures the acidity level in your esophagus.  A test thatmeasures how much pressure is on your esophagus.  A barium swallow or modified barium swallow to show the shape, size, and functioning of your esophagus. TREATMENT The goal of treatment is to help relieve your symptoms and to prevent complications. Treatment for this condition may vary depending on how severe your symptoms are. Your health care provider may recommend:  Changes to your diet.  Medicine.  Surgery. HOME CARE INSTRUCTIONS Diet  Follow a diet as recommended by your health care provider. This may involve avoiding foods and drinks such as:  Coffee and tea (with or without caffeine).  Drinks that containalcohol.  Energy drinks and sports drinks.  Carbonated drinks or sodas.  Chocolate and cocoa.  Peppermint and mint flavorings.  Garlic and onions.  Horseradish.  Spicy and acidic foods, including peppers, chili powder, curry powder, vinegar, hot sauces, and barbecue sauce.  Citrus fruit juices and citrus fruits, such as oranges, lemons, and limes.  Tomato-based foods, such as red sauce, chili, salsa, and pizza with red sauce.  Fried and fatty foods, such as donuts, french fries, potato chips, and high-fat dressings.  High-fat meats, such as hot dogs and fatty cuts of red and white meats, such as rib eye steak, sausage, ham, and bacon.  High-fat dairy items, such as whole milk, butter, and cream cheese.  Eat small, frequent meals instead of large meals.  Avoid drinking large amounts of liquid with your  meals.  Avoid eating meals during the 2-3 hours before bedtime.  Avoid lying down right after you eat.  Do not exercise right after you eat. General Instructions  Pay attention to any changes in your symptoms.  Take over-the-counter and prescription  medicines only as told by your health care provider. Do not take aspirin, ibuprofen, or other NSAIDs unless your health care provider told you to do so.  Do not use any tobacco products, including cigarettes, chewing tobacco, and e-cigarettes. If you need help quitting, ask your health care provider.  Wear loose-fitting clothing. Do not wear anything tight around your waist that causes pressure on your abdomen.  Raise (elevate) the head of your bed 6 inches (15cm).  Try to reduce your stress, such as with yoga or meditation. If you need help reducing stress, ask your health care provider.  If you are overweight, reduce your weight to an amount that is healthy for you. Ask your health care provider for guidance about a safe weight loss goal.  Keep all follow-up visits as told by your health care provider. This is important. SEEK MEDICAL CARE IF:  You have new symptoms.  You have unexplained weight loss.  You have difficulty swallowing, or it hurts to swallow.  You have wheezing or a persistent cough.  Your symptoms do not improve with treatment.  You have a hoarse voice. SEEK IMMEDIATE MEDICAL CARE IF:  You have pain in your arms, neck, jaw, teeth, or back.  You feel sweaty, dizzy, or light-headed.  You have chest pain or shortness of breath.  You vomit and your vomit looks like blood or coffee grounds.  You faint.  Your stool is bloody or black.  You cannot swallow, drink, or eat.   This information is not intended to replace advice given to you by your health care provider. Make sure you discuss any questions you have with your health care provider.   Document Released: 11/08/2004 Document Revised: 10/20/2014 Document Reviewed: 05/26/2014 Elsevier Interactive Patient Education 2016 [employer]. Viral Gastroenteritis Viral gastroenteritis is also known as stomach flu. This condition affects the stomach and intestinal tract. It can cause sudden diarrhea and vomiting.  The illness typically lasts 3 to 8 days. Most people develop an immune response that eventually gets rid of the virus. While this natural response develops, the virus can make you quite ill. CAUSES  Many different viruses can cause gastroenteritis, such as rotavirus or noroviruses. You can catch one of these viruses by consuming contaminated food or water. You may also catch a virus by sharing utensils or other personal items with an infected person or by touching a contaminated surface. SYMPTOMS  The most common symptoms are diarrhea and vomiting. These problems can cause a severe loss of body fluids (dehydration) and a body salt (electrolyte) imbalance. Other symptoms may include:  Fever.  Headache.  Fatigue.  Abdominal pain. DIAGNOSIS  Your caregiver can usually diagnose viral gastroenteritis based on your symptoms and a physical exam. A stool sample may also be taken to test for the presence of viruses or other infections. TREATMENT  This illness typically goes away on its own. Treatments are aimed at rehydration. The most serious cases of viral gastroenteritis involve vomiting so severely that you are not able to keep fluids down. In these cases, fluids must be given through an intravenous line (IV). HOME CARE INSTRUCTIONS   Drink enough fluids to keep your urine clear or pale yellow. Drink small amounts of fluids frequently and  increase the amounts as tolerated.  Ask your caregiver for specific rehydration instructions.  Avoid:  Foods high in sugar.  Alcohol.  Carbonated drinks.  Tobacco.  Juice.  Caffeine drinks.  Extremely hot or cold fluids.  Fatty, greasy foods.  Too much intake of anything at one time.  Dairy products until 24 to 48 hours after diarrhea stops.  You may consume probiotics. Probiotics are active cultures of beneficial bacteria. They may lessen the amount and number of diarrheal stools in adults. Probiotics can be found in yogurt with active  cultures and in supplements.  Wash your hands well to avoid spreading the virus.  Only take over-the-counter or prescription medicines for pain, discomfort, or fever as directed by your caregiver. Do not give aspirin to children. Antidiarrheal medicines are not recommended.  Ask your caregiver if you should continue to take your regular prescribed and over-the-counter medicines.  Keep all follow-up appointments as directed by your caregiver. SEEK IMMEDIATE MEDICAL CARE IF:   You are unable to keep fluids down.  You do not urinate at least once every 6 to 8 hours.  You develop shortness of breath.  You notice blood in your stool or vomit. This may look like coffee grounds.  You have abdominal pain that increases or is concentrated in one small area (localized).  You have persistent vomiting or diarrhea.  You have a fever.  The patient is a child younger than 3 months, and he or she has a fever.  The patient is a child older than 3 months, and he or she has a fever and persistent symptoms.  The patient is a child older than 3 months, and he or she has a fever and symptoms suddenly get worse.  The patient is a baby, and he or she has no tears when crying. MAKE SURE YOU:   Understand these instructions.  Will watch your condition.  Will get help right away if you are not doing well or get worse.   This information is not intended to replace advice given to you by your health care provider. Make sure you discuss any questions you have with your health care provider.   Document Released: 01/29/2005 Document Revised: 04/23/2011 Document Reviewed: 11/15/2010 Elsevier Interactive Patient Education [employer]

## 2015-05-02 ENCOUNTER — Encounter (HOSPITAL_COMMUNITY): Payer: Self-pay | Admitting: Emergency Medicine

## 2015-05-02 ENCOUNTER — Emergency Department (HOSPITAL_COMMUNITY)
Admission: EM | Admit: 2015-05-02 | Discharge: 2015-05-02 | Disposition: A | Discharge status: Home / Self Care | Payer: BLUE CROSS/BLUE SHIELD | Source: Home / Self Care | Attending: Family Medicine | Admitting: Family Medicine

## 2015-05-02 DIAGNOSIS — O2692 Pregnancy related conditions, unspecified, second trimester: Secondary | ICD-10-CM

## 2015-05-02 DIAGNOSIS — J069 Acute upper respiratory infection, unspecified: Secondary | ICD-10-CM

## 2015-05-02 MED ORDER — IPRATROPIUM BROMIDE 0.06 % NA SOLN: 2.0000 | Freq: Four times a day (QID) | NASAL | Status: DC

## 2015-05-02 NOTE — ED Notes (Addendum)
Facial congestion, stuffy nose and right ear pain -symptoms since Thursday 7/16.  Reports low grade fever of 100.  PATIENT IS PREGNANT-21 WEEKS

## 2015-05-02 NOTE — ED Provider Notes (Signed)
CSN: [id number]     Arrival date & time 05/02/15  1558 History   First MD Initiated Contact with Patient 05/02/15 1736     Chief Complaint  Patient presents with  . Otalgia   (Consider location/radiation/quality/duration/timing/severity/associated sxs/prior Treatment) Patient is a 35 y.o. female presenting with ear pain. The history is provided by the patient.  Otalgia Location:  Right Behind ear:  No abnormality Quality:  Pressure and sharp (21wk preg, using afrin but told today to d/c.) Severity:  Mild Onset quality:  Gradual Duration:  5 days Progression:  Unchanged Chronicity:  New Ineffective treatments:  OTC medications Associated symptoms: congestion, cough, rhinorrhea and sore throat   Associated symptoms: no ear discharge and no fever     Past Medical History  Diagnosis Date  . No pertinent past medical history   . Tachycardia   . History of chicken pox   . Tilted uterus   . Anemia     postpartum  . IBS (irritable bowel syndrome)   . History of pyelonephritis     as child  . ADHD (attention deficit hyperactivity disorder)   . Seasonal allergies   . GERD (gastroesophageal reflux disease)    Past Surgical History  Procedure Laterality Date  . No past surgeries     Family History  Problem Relation Age of Onset  . Hypertension Mother   . Hypothyroidism Mother   . Thyroid disease Mother   . Cancer Maternal Grandfather     breast  . Lupus Paternal Grandmother   . Diabetes Paternal Grandfather    Social History  Substance Use Topics  . Smoking status: Never Smoker   . Smokeless tobacco: Never Used  . Alcohol Use: No   OB History    Gravida Para Term Preterm AB TAB SAB Ectopic Multiple Living   Review of Systems  Constitutional: Negative.  Negative for fever.  HENT: Positive for congestion, ear pain, postnasal drip, rhinorrhea, sinus pressure and sore throat. Negative for ear discharge.   Eyes: Negative.   Respiratory: Positive  for cough.   Cardiovascular: Negative.   All other systems reviewed and are negative.   Allergies  Review of patient's allergies indicates no known allergies.  Home Medications   Prior to Admission medications   Medication Sig Start Date End Date Taking? Authorizing Provider  oxymetazoline (AFRIN) 0.05 % nasal spray Place 1 spray into both nostrils 2 (two) times daily.   Yes Historical Provider, MD  aspirin 81 MG chewable tablet Chew 81 mg by mouth daily.    Historical Provider, MD  ipratropium (ATROVENT) 0.06 % nasal spray Place 2 sprays into both nostrils 4 (four) times daily. 05/02/15   Linna Hoff, MD  ondansetron (ZOFRAN) 8 MG tablet Take 8 mg by mouth every 8 (eight) hours as needed for nausea or vomiting.    Historical Provider, MD  pantoprazole (PROTONIX) 20 MG tablet Take 1 tablet (20 mg total) by mouth daily. 04/04/15   Aviva Signs, CNM  Prenatal Vit-Fe Fumarate-FA (PRENATAL MULTIVITAMIN) TABS tablet Take 1 tablet by mouth daily at 12 noon.    Historical Provider, MD  promethazine (PHENERGAN) 25 MG tablet Take 1 tablet (25 mg total) by mouth every 6 (six) hours as needed for nausea or vomiting. 04/04/15   Aviva Signs, CNM   Meds Ordered and Administered this Visit  Medications - No data to display  BP 101/60 mmHg  Pulse 84  Temp(Src) 97.7 F (36.5 C) (Oral)  Resp 16  SpO2 100% No data found.   Physical Exam  Constitutional: She is oriented to person, place, and time. She appears well-developed and well-nourished. No distress.  HENT:  Head: Normocephalic.  Right Ear: External ear normal.  Left Ear: External ear normal.  Nose: Mucosal edema and rhinorrhea present. Right sinus exhibits maxillary sinus tenderness. Left sinus exhibits maxillary sinus tenderness.  Mouth/Throat: Oropharynx is clear and moist.  Neck: Normal range of motion. Neck supple.  Cardiovascular: Normal heart sounds and intact distal pulses.   Pulmonary/Chest: Effort normal and breath  sounds normal.  Lymphadenopathy:    She has no cervical adenopathy.  Neurological: She is alert and oriented to person, place, and time.  Skin: Skin is warm and dry.  Nursing note and vitals reviewed.   ED Course  Procedures (including critical care time)  Labs Review Labs Reviewed - No data to display  Imaging Review No results found.   Visual Acuity Review  Right Eye Distance:   Left Eye Distance:   Bilateral Distance:    Right Eye Near:   Left Eye Near:    Bilateral Near:         MDM   1. URI (upper respiratory infection)   2. Pregnancy related condition in second trimester        Linna HoffJames D Suhaylah Wampole, MD 05/02/15 1815

## 2015-06-22 ENCOUNTER — Inpatient Hospital Stay (HOSPITAL_COMMUNITY)
Admission: AD | Admit: 2015-06-22 | Discharge: 2015-06-22 | Disposition: A | Discharge status: Home / Self Care | Payer: BLUE CROSS/BLUE SHIELD | Source: Ambulatory Visit | Attending: Obstetrics and Gynecology | Admitting: Obstetrics and Gynecology

## 2015-06-22 ENCOUNTER — Encounter (HOSPITAL_COMMUNITY): Payer: Self-pay | Admitting: *Deleted

## 2015-06-22 DIAGNOSIS — K219 Gastro-esophageal reflux disease without esophagitis: Secondary | ICD-10-CM | POA: Insufficient documentation

## 2015-06-22 DIAGNOSIS — O1203 Gestational edema, third trimester: Secondary | ICD-10-CM | POA: Diagnosis not present

## 2015-06-22 DIAGNOSIS — Z3A28 28 weeks gestation of pregnancy: Secondary | ICD-10-CM | POA: Diagnosis not present

## 2015-06-22 DIAGNOSIS — O4703 False labor before 37 completed weeks of gestation, third trimester: Secondary | ICD-10-CM | POA: Insufficient documentation

## 2015-06-22 DIAGNOSIS — O1202 Gestational edema, second trimester: Secondary | ICD-10-CM | POA: Diagnosis not present

## 2015-06-22 DIAGNOSIS — Z79899 Other long term (current) drug therapy: Secondary | ICD-10-CM | POA: Diagnosis not present

## 2015-06-22 DIAGNOSIS — O479 False labor, unspecified: Secondary | ICD-10-CM

## 2015-06-22 HISTORY — DX: Unspecified infectious disease: B99.9

## 2015-06-22 LAB — URINALYSIS, ROUTINE W REFLEX MICROSCOPIC
Bilirubin Urine: NEGATIVE
GLUCOSE, UA: NEGATIVE mg/dL
HGB URINE DIPSTICK: NEGATIVE
KETONES UR: NEGATIVE mg/dL
LEUKOCYTES UA: NEGATIVE
Nitrite: NEGATIVE
Protein, ur: NEGATIVE mg/dL
Specific Gravity, Urine: <1.005 — ABNORMAL LOW (ref 1.005–1.030)
pH: 6 (ref 5.0–8.0)

## 2015-06-22 NOTE — MAU Note (Signed)
Urine sent to lab 

## 2015-06-22 NOTE — MAU Note (Signed)
Pt reports swelling in her hands and feet for the last few days. Also reports contractions and cramping and today.

## 2015-06-22 NOTE — MAU Provider Note (Signed)
History     CSN: [id number]  Arrival date and time: 06/22/15 2123   First Provider Initiated Contact with Patient 06/22/15 2218      Chief Complaint  Patient presents with  . Contractions  . Edema   HPI Comments: Tanya [name] is a 35 y.o. [id number] at [redacted]w[redacted]d who presents today with contractions and edema. She states that yesterday she was in a conference all day, and sat all day. She states that the edema seems to be a little better today. She states that the contractions began around 1830, and were about every 13-15 mins. She states that they have been getting better since arriving here. She has an appointment in the office. She has a placenta previa. She denies any VB or LOF.   Pelvic Pain The patient's primary symptoms include pelvic pain. This is a new problem. The current episode started today. The problem occurs intermittently (about every 13-15 mins, but she states that they are decreasing now. ). The problem has been gradually improving. Pain severity now: 2/10  The problem affects both sides. She is pregnant. Associated symptoms include abdominal pain. Pertinent negatives include no chills, dysuria, fever, frequency, nausea, urgency or vomiting. The vaginal discharge was normal. There has been no bleeding. Nothing aggravates the symptoms. She has tried nothing for the symptoms. Sexual activity: pelvic rest, due placenta previa.     Past Medical History  Diagnosis Date  . No pertinent past medical history   . Tachycardia   . History of chicken pox   . Tilted uterus   . Anemia     postpartum  . IBS (irritable bowel syndrome)   . History of pyelonephritis     as child  . ADHD (attention deficit hyperactivity disorder)   . Seasonal allergies   . GERD (gastroesophageal reflux disease)   . Infection     UTI    Past Surgical History  Procedure Laterality Date  . No past surgeries    . Dilate and curettage  2016    Family History  Problem Relation Age of Onset  .  Hypertension Mother   . Hypothyroidism Mother   . Thyroid disease Mother   . Cancer Maternal Grandfather     breast  . Lupus Paternal Grandmother   . Diabetes Paternal Grandfather     Social History  Substance Use Topics  . Smoking status: Never Smoker   . Smokeless tobacco: Never Used  . Alcohol Use: No    Allergies: No Known Allergies  Prescriptions prior to admission  Medication Sig Dispense Refill Last Dose  . pantoprazole (PROTONIX) 20 MG tablet Take 1 tablet (20 mg total) by mouth daily. 30 tablet 1 06/22/2015 at Unknown time  . Prenatal Vit-Fe Fumarate-FA (PRENATAL MULTIVITAMIN) TABS tablet Take 1 tablet by mouth daily at 12 noon.   06/22/2015 at Unknown time  . ipratropium (ATROVENT) 0.06 % nasal spray Place 2 sprays into both nostrils 4 (four) times daily. (Patient not taking: Reported on 06/22/2015) 15 mL 1   . ondansetron (ZOFRAN) 8 MG tablet Take 8 mg by mouth every 8 (eight) hours as needed for nausea or vomiting. Reported on 06/22/2015   Not Taking at Unknown time  . promethazine (PHENERGAN) 25 MG tablet Take 1 tablet (25 mg total) by mouth every 6 (six) hours as needed for nausea or vomiting. (Patient not taking: Reported on 06/22/2015) 30 tablet 2 Not Taking at Unknown time    Review of Systems  Constitutional: Negative for fever and  chills.  Gastrointestinal: Positive for abdominal pain. Negative for nausea and vomiting.  Genitourinary: Positive for pelvic pain. Negative for dysuria, urgency and frequency.   Physical Exam   Blood pressure 118/74, pulse 82, temperature 98.4 F (36.9 C), temperature source Oral, resp. rate 16, height 5\' 7"  (1.702 m), weig ht 92.534 kg (204 lb), SpO2 98 %.  Physical Exam  Nursing note and vitals reviewed. Constitutional: She is oriented to person, place, and time. She appears well-developed and well-nourished. No distress.  HENT:  Head: Normocephalic.  Cardiovascular: Normal rate.   Respiratory: Effort normal.  GI: Soft. There is  no tenderness. There is no rebound.  Neurological: She is alert and oriented to person, place, and time.  Skin: Skin is warm and dry.  Psychiatric: She has a normal mood and affect.   FHT 145, moderate with 15x15 accels, no decels Toco: no UCs  MAU Course  Procedures  MDM 2324: D/W Dr. Renaldo FiddlerAdkins, ok for DC home. FU in the office as planned tomorrow.   Assessment and Plan   1. Deberah PeltonBraxton Hicks' contraction   2. Edema in pregnancy in third trimester   3. [redacted] weeks gestation of pregnancy    DC home Comfort measures reviewed  3rd Trimester precautions  Bleeding precautions PTL precautions  Fetal kick counts RX: none Return to MAU as needed FU with OB as planned  Follow-up Information    Follow up with Zelphia CairoADKINS,GRETCHEN, MD.   Specialty:  Obstetrics and Gynecology   Why:  As scheduled   Contact information:   [address], [address] [place] [address] [phone]         [name], [name] 06/22/2015, 10:20 PM

## 2015-06-22 NOTE — Discharge Instructions (Signed)

## 2015-06-24 ENCOUNTER — Other Ambulatory Visit: Payer: Self-pay | Admitting: Advanced Practice Midwife

## 2015-08-03 ENCOUNTER — Inpatient Hospital Stay (HOSPITAL_COMMUNITY)
Admission: AD | Admit: 2015-08-03 | Discharge: 2015-08-11 | DRG: 765 | Disposition: A | Discharge status: Home / Self Care | Payer: BLUE CROSS/BLUE SHIELD | Source: Ambulatory Visit | Attending: Obstetrics and Gynecology | Admitting: Obstetrics and Gynecology

## 2015-08-03 ENCOUNTER — Encounter (HOSPITAL_COMMUNITY): Payer: Self-pay

## 2015-08-03 DIAGNOSIS — Z98891 History of uterine scar from previous surgery: Secondary | ICD-10-CM

## 2015-08-03 DIAGNOSIS — O321XX0 Maternal care for breech presentation, not applicable or unspecified: Secondary | ICD-10-CM | POA: Diagnosis present

## 2015-08-03 DIAGNOSIS — Z79899 Other long term (current) drug therapy: Secondary | ICD-10-CM

## 2015-08-03 DIAGNOSIS — O9962 Diseases of the digestive system complicating childbirth: Secondary | ICD-10-CM | POA: Diagnosis present

## 2015-08-03 DIAGNOSIS — Z3A34 34 weeks gestation of pregnancy: Secondary | ICD-10-CM

## 2015-08-03 DIAGNOSIS — O4400 Placenta previa specified as without hemorrhage, unspecified trimester: Secondary | ICD-10-CM | POA: Diagnosis present

## 2015-08-03 DIAGNOSIS — K219 Gastro-esophageal reflux disease without esophagitis: Secondary | ICD-10-CM | POA: Diagnosis present

## 2015-08-03 DIAGNOSIS — D62 Acute posthemorrhagic anemia: Secondary | ICD-10-CM | POA: Diagnosis present

## 2015-08-03 DIAGNOSIS — F419 Anxiety disorder, unspecified: Secondary | ICD-10-CM | POA: Diagnosis present

## 2015-08-03 DIAGNOSIS — O4693 Antepartum hemorrhage, unspecified, third trimester: Secondary | ICD-10-CM

## 2015-08-03 DIAGNOSIS — O9902 Anemia complicating childbirth: Secondary | ICD-10-CM | POA: Diagnosis present

## 2015-08-03 DIAGNOSIS — O4403 Placenta previa specified as without hemorrhage, third trimester: Secondary | ICD-10-CM

## 2015-08-03 DIAGNOSIS — O4413 Placenta previa with hemorrhage, third trimester: Secondary | ICD-10-CM | POA: Diagnosis present

## 2015-08-03 HISTORY — DX: Complete placenta previa nos or without hemorrhage, unspecified trimester: O44.00

## 2015-08-03 LAB — CBC
HEMATOCRIT: 31.2 % — AB (ref 36.0–46.0)
HEMOGLOBIN: 10.5 g/dL — AB (ref 12.0–15.0)
MCH: 28.6 pg (ref 26.0–34.0)
MCHC: 33.7 g/dL (ref 30.0–36.0)
MCV: 85 fL (ref 78.0–100.0)
Platelets: 246 10*3/uL (ref 150–400)
RBC: 3.67 MIL/uL — AB (ref 3.87–5.11)
RDW: 12.8 % (ref 11.5–15.5)
WBC: 10.9 10*3/uL — AB (ref 4.0–10.5)

## 2015-08-03 MED ORDER — ZOLPIDEM TARTRATE 5 MG PO TABS: 5.0000 mg | ORAL_TABLET | Freq: Every evening | ORAL | Status: DC | PRN

## 2015-08-03 MED ORDER — ACETAMINOPHEN 325 MG PO TABS
650.0000 mg | ORAL_TABLET | ORAL | Status: DC | PRN
  Administered 2015-08-04 – 2015-08-05 (×2): 650 mg via ORAL
  Filled 2015-08-03 (×2): qty 2

## 2015-08-03 MED ORDER — PRENATAL MULTIVITAMIN CH
1.0000 | ORAL_TABLET | Freq: Every day | ORAL | Status: DC
  Administered 2015-08-04 – 2015-08-07 (×4): 1 via ORAL
  Filled 2015-08-03 (×6): qty 1

## 2015-08-03 MED ORDER — BETAMETHASONE SOD PHOS & ACET 6 (3-3) MG/ML IJ SUSP
12.0000 mg | INTRAMUSCULAR | Status: AC
  Administered 2015-08-04 – 2015-08-05 (×2): 12 mg via INTRAMUSCULAR
  Filled 2015-08-03 (×2): qty 2

## 2015-08-03 MED ORDER — SODIUM CHLORIDE 0.9% FLUSH
3.0000 mL | Freq: Two times a day (BID) | INTRAVENOUS | Status: DC
  Administered 2015-08-04 – 2015-08-07 (×8): 3 mL via INTRAVENOUS

## 2015-08-03 MED ORDER — CALCIUM CARBONATE ANTACID 500 MG PO CHEW
2.0000 | CHEWABLE_TABLET | ORAL | Status: DC | PRN
  Filled 2015-08-03: qty 2

## 2015-08-03 MED ORDER — DOCUSATE SODIUM 100 MG PO CAPS
100.0000 mg | ORAL_CAPSULE | Freq: Every day | ORAL | Status: DC
Start: 2015-08-04 — End: 2015-08-07
  Administered 2015-08-04 – 2015-08-07 (×4): 100 mg via ORAL
  Filled 2015-08-03 (×5): qty 1

## 2015-08-03 MED ORDER — SODIUM CHLORIDE 0.9% FLUSH
3.0000 mL | INTRAVENOUS | Status: DC | PRN
  Administered 2015-08-05: 3 mL via INTRAVENOUS
  Filled 2015-08-03: qty 3

## 2015-08-03 MED ORDER — SODIUM CHLORIDE 0.9 % IV SOLN: 250.0000 mL | INTRAVENOUS | Status: DC | PRN

## 2015-08-03 NOTE — MAU Provider Note (Signed)
History     CSN: [id number]  Arrival date and time: 08/03/15 2139   First Provider Initiated Contact with Patient 08/03/15 2234      Chief Complaint  Patient presents with  . Vaginal Bleeding   Vaginal Bleeding The patient's primary symptoms include vaginal bleeding. This is a new problem. The current episode started today (just prior to arrival ). The problem has been unchanged. The patient is experiencing no pain. She is pregnant. Associated symptoms include diarrhea. Pertinent negatives include no abdominal pain, chills, constipation, dysuria, fever, frequency, nausea, urgency or vomiting. The vaginal discharge was bloody. She has not been passing clots. The symptoms are aggravated by activity. She has tried nothing for the symptoms. Sexual activity: Patient denies any recent intercourse      Past Medical History  Diagnosis Date  . No pertinent past medical history   . Tachycardia   . History of chicken pox   . Tilted uterus   . Anemia     postpartum  . IBS (irritable bowel syndrome)   . History of pyelonephritis     as child  . ADHD (attention deficit hyperactivity disorder)   . Seasonal allergies   . GERD (gastroesophageal reflux disease)   . Infection     UTI    Past Surgical History  Procedure Laterality Date  . No past surgeries    . Dilate and curettage  2016    Family History  Problem Relation Age of Onset  . Hypertension Mother   . Hypothyroidism Mother   . Thyroid disease Mother   . Cancer Maternal Grandfather     breast  . Lupus Paternal Grandmother   . Diabetes Paternal Grandfather     Social History  Substance Use Topics  . Smoking status: Never Smoker   . Smokeless tobacco: Never Used  . Alcohol Use: No    Allergies: No Known Allergies  Prescriptions prior to admission  Medication Sig Dispense Refill Last Dose  . pantoprazole (PROTONIX) 40 MG tablet Take 40 mg by mouth daily.   08/03/2015 at Unknown time  . Prenatal Vit-Fe Fumarate-FA  (PRENATAL MULTIVITAMIN) TABS tablet Take 1 tablet by mouth daily at 12 noon.    08/02/2015 at Unknown time  . sertraline (ZOLOFT) 50 MG tablet Take 50 mg by mouth daily.   08/02/2015 at Unknown time    Review of Systems  Constitutional: Negative for fever and chills.  Gastrointestinal: Positive for diarrhea. Negative for nausea, vomiting, abdominal pain and constipation.  Genitourinary: Positive for vaginal bleeding. Negative for dysuria, urgency and frequency.   Physical Exam   Blood pressure 128/71, temperature 98.1 F (36.7 C), temperature source Oral, resp. rate 18, height 5\' 7"  (1.702 m), weight 97.433 kg (214 lb 12.8 oz), SpO2 98 %.  Physical Exam  Nursing note and vitals reviewed. Constitutional: She is oriented to person, place, and time. She appears well-developed and well-nourished. No distress.  HENT:  Head: Normocephalic.  Cardiovascular: Normal rate.   Respiratory: Effort normal.  GI: Soft. There is no tenderness. There is no rebound.  Neurological: She is alert and oriented to person, place, and time.  Skin: Skin is warm and dry.  Psychiatric: She has a normal mood and affect.    MAU Course  Procedures  MDM  2310: D/W Dr. Renaldo FiddlerAdkins, she will call NICU about admission. NICU currently high census. 2314: Ok for admission here. Will admit to ante.  Assessment and Plan   1. Placenta previa antepartum, third trimester   2.  Vaginal bleeding in pregnancy, third trimester    Admit to antenatal   Tawnya Crook 08/03/2015, 10:36 PM

## 2015-08-03 NOTE — MAU Note (Signed)
Pt states that she has a complete previa but started having some vaginal bleeding that started about 30 mins ago-states it was running down leg. Is not wearing a pad in triage. Denies pain, just mild cramping. +FM

## 2015-08-04 ENCOUNTER — Inpatient Hospital Stay (HOSPITAL_COMMUNITY): Payer: BLUE CROSS/BLUE SHIELD

## 2015-08-04 ENCOUNTER — Encounter (HOSPITAL_COMMUNITY): Payer: Self-pay | Admitting: *Deleted

## 2015-08-04 LAB — CBC
HEMATOCRIT: 29.8 % — AB (ref 36.0–46.0)
Hemoglobin: 10 g/dL — ABNORMAL LOW (ref 12.0–15.0)
MCH: 29.3 pg (ref 26.0–34.0)
MCHC: 33.6 g/dL (ref 30.0–36.0)
MCV: 87.4 fL (ref 78.0–100.0)
PLATELETS: 252 10*3/uL (ref 150–400)
RBC: 3.41 MIL/uL — ABNORMAL LOW (ref 3.87–5.11)
RDW: 12.9 % (ref 11.5–15.5)
WBC: 13.8 10*3/uL — AB (ref 4.0–10.5)

## 2015-08-04 LAB — ABO/RH: ABO/RH(D): O POS

## 2015-08-04 LAB — TYPE AND SCREEN
ABO/RH(D): O POS
ANTIBODY SCREEN: NEGATIVE

## 2015-08-04 MED ORDER — SERTRALINE HCL 50 MG PO TABS: 50.0000 mg | ORAL_TABLET | Freq: Every day | ORAL | Status: DC

## 2015-08-04 MED ORDER — PANTOPRAZOLE SODIUM 40 MG PO TBEC: 40.0000 mg | DELAYED_RELEASE_TABLET | Freq: Every day | ORAL | Status: DC

## 2015-08-04 MED ORDER — PANTOPRAZOLE SODIUM 40 MG PO TBEC
40.0000 mg | DELAYED_RELEASE_TABLET | Freq: Every day | ORAL | Status: DC
  Administered 2015-08-04 – 2015-08-07 (×4): 40 mg via ORAL
  Filled 2015-08-04 (×4): qty 1

## 2015-08-04 MED ORDER — PRENATAL MULTIVITAMIN CH: 1.0000 | ORAL_TABLET | Freq: Every day | ORAL | Status: DC

## 2015-08-04 MED ORDER — SERTRALINE HCL 50 MG PO TABS
50.0000 mg | ORAL_TABLET | Freq: Every day | ORAL | Status: DC
  Administered 2015-08-04 – 2015-08-07 (×4): 50 mg via ORAL
  Filled 2015-08-04 (×4): qty 1

## 2015-08-04 NOTE — Progress Notes (Signed)
Ultrasound today shows central previa with small clot at internal os. Appropriate EFW at 82% and high normal AFI.  Oblique lie with head in LLQ.  Mitchel HonourMegan Taylynn Easton, DO

## 2015-08-04 NOTE — H&P (Signed)
Tanya [name] is a 35 y.o. female presenting for first bleed with placenta previa.  The patient was out with her kids and noticed blood dripping down her legs.  She came in to MAU immediately.  Bleeding has since decreased; light brownish discharge now.  No CTX or pain.  Active FM.  Patient takes Sertraline 50 mg po daily for anxiety this pregnancy.  She is taking Protonix for GERD.  Otherwise, uncomplicated pregnancy.  C/S currently schedule 7/5.  Maternal Medical History:  Reason for admission: Vaginal bleeding.   Fetal activity: Perceived fetal activity is normal.   Last perceived fetal movement was within the past hour.    Prenatal complications: Bleeding.   Prenatal Complications - Diabetes: none.    OB History    Gravida Para Term Preterm AB TAB SAB Ectopic Multiple Living   5 2 2  2  2   2      Past Medical History  Diagnosis Date  . No pertinent past medical history   . Tachycardia   . History of chicken pox   . Tilted uterus   . Anemia     postpartum  . IBS (irritable bowel syndrome)   . History of pyelonephritis     as child  . ADHD (attention deficit hyperactivity disorder)   . Seasonal allergies   . GERD (gastroesophageal reflux disease)   . Infection     UTI   Past Surgical History  Procedure Laterality Date  . No past surgeries    . Dilate and curettage  2016   Family History: family history includes Cancer in her maternal grandfather; Diabetes in her paternal grandfather; Hypertension in her mother; Hypothyroidism in her mother; Lupus in her paternal grandmother; Thyroid disease in her mother. Social History:  reports that she has never smoked. She has never used smokeless tobacco. She reports that she does not drink alcohol or use illicit drugs.   Prenatal Transfer Tool  Maternal Diabetes: No Genetic Screening: Normal Maternal Ultrasounds/Referrals: Abnormal:  Findings:   Other: Previa Fetal Ultrasounds or other Referrals:  None Maternal Substance  Abuse:  No Significant Maternal Medications:  Meds include: Protonix Other: sertraline Significant Maternal Lab Results:  None Other Comments:  None  ROS    Blood pressure 107/65, pulse 106, temperature 98.5 F (36.9 C), temperature source Axillary, resp. rate 17, height 5\' 7"  (1.702 m), weight 214 lb 12.8 oz (97.433 kg), SpO2 99 %. Maternal Exam:  Abdomen: Fundal height is c/w dates.   Estimated fetal weight is 5#.    Introitus: Normal vulva.   Physical Exam  Constitutional: She is oriented to person, place, and time. She appears well-developed and well-nourished.  GI: Soft. There is no tenderness. There is no rebound and no guarding.  Neurological: She is alert and oriented to person, place, and time.  Skin: Skin is warm and dry.  Psychiatric: She has a normal mood and affect. Her behavior is normal.    Prenatal labs: ABO, Rh: --/--/O POS, O POS (06/21 2325) Antibody: NEG (06/21 2325) Rubella:   RPR:    HBsAg:    HIV:    GBS:     Assessment/Plan: 77GN [id number] G5P2022 at 5157w6d with previa; first bleed -BMZ series -CEFM -MFM u/s today; due for growth -Monitor for new bleed and C/S if occurs (currently posted 7/6)   Eann Cleland 08/04/2015, 8:08 AM

## 2015-08-05 NOTE — Progress Notes (Signed)
Patient is doing well.  Denies any vaginal bleeding and reports good fetal movement.  BP 103/40 mmHg  Pulse 107  Temp(Src) 98.2 F (36.8 C) (Oral)  Resp 18  Ht 5\' 7"  (1.702 m)  Wt 97.433 kg (214 lb 12.8 oz)  BMI 33.63 kg/m2  SpO2 98%  Abdomen is soft and non tender  No blood noted on pad  IMPRESSION: IUP at 35 weeks Central Placenta Previa Status post vaginal bleeding 2 days ago  PLAN: Patient is currently stable and received 2 doses of steroids I discussed with patient that if she has another episode of bleeding we will proceed with C Section and she is aware of that . Possibility of blood transfusion, massive hemorrhage and need for emergent hysterectomy discussed with patient.

## 2015-08-06 NOTE — Progress Notes (Signed)
Patient doing well.  No episodes of bleeding  BP 93/40 mmHg  Pulse 108  Temp(Src) 98.2 F (36.8 C) (Oral)  Resp 18  Ht 5\' 7"  (1.702 m)  Wt 97.433 kg (214 lb 12.8 oz)  BMI 33.63 kg/m2  SpO2 98% No results found for this or any previous visit (from the past 24 hour(s)). Abdomen is soft and non tender  IMPRESSION: IUP at 35 w 1 day Central Placenta Previa  PLAN: Continue present plan C Section if bleeding recurs

## 2015-08-07 ENCOUNTER — Encounter (HOSPITAL_COMMUNITY): Payer: Self-pay

## 2015-08-07 ENCOUNTER — Encounter (HOSPITAL_COMMUNITY)
Admission: AD | Disposition: A | Discharge status: Home / Self Care | Payer: Self-pay | Source: Ambulatory Visit | Attending: Obstetrics and Gynecology

## 2015-08-07 ENCOUNTER — Inpatient Hospital Stay (HOSPITAL_COMMUNITY): Payer: BLUE CROSS/BLUE SHIELD | Admitting: Anesthesiology

## 2015-08-07 DIAGNOSIS — Z98891 History of uterine scar from previous surgery: Secondary | ICD-10-CM

## 2015-08-07 HISTORY — DX: History of uterine scar from previous surgery: Z98.891

## 2015-08-07 LAB — CBC
HEMATOCRIT: 27.7 % — AB (ref 36.0–46.0)
HEMOGLOBIN: 9.3 g/dL — AB (ref 12.0–15.0)
MCH: 28.8 pg (ref 26.0–34.0)
MCHC: 33.6 g/dL (ref 30.0–36.0)
MCV: 85.8 fL (ref 78.0–100.0)
Platelets: 243 10*3/uL (ref 150–400)
RBC: 3.23 MIL/uL — AB (ref 3.87–5.11)
RDW: 13.3 % (ref 11.5–15.5)
WBC: 10.3 10*3/uL (ref 4.0–10.5)

## 2015-08-07 LAB — PREPARE RBC (CROSSMATCH)

## 2015-08-07 SURGERY — Surgical Case
Anesthesia: Spinal

## 2015-08-07 MED ORDER — TETANUS-DIPHTH-ACELL PERTUSSIS 5-2.5-18.5 LF-MCG/0.5 IM SUSP: 0.5000 mL | Freq: Once | INTRAMUSCULAR | Status: DC

## 2015-08-07 MED ORDER — OXYCODONE-ACETAMINOPHEN 5-325 MG PO TABS
2.0000 | ORAL_TABLET | ORAL | Status: DC | PRN
  Administered 2015-08-07 – 2015-08-11 (×10): 2 via ORAL
  Filled 2015-08-07 (×12): qty 2

## 2015-08-07 MED ORDER — BUPIVACAINE HCL (PF) 0.25 % IJ SOLN
INTRAMUSCULAR | Status: AC
  Filled 2015-08-07: qty 30

## 2015-08-07 MED ORDER — SODIUM CHLORIDE 0.9 % IV SOLN: 10.0000 mL/h | Freq: Once | INTRAVENOUS | Status: DC

## 2015-08-07 MED ORDER — MORPHINE SULFATE (PF) 0.5 MG/ML IJ SOLN
INTRAMUSCULAR | Status: DC | PRN
  Administered 2015-08-07: .1 mg via INTRATHECAL

## 2015-08-07 MED ORDER — PHENYLEPHRINE HCL 10 MG/ML IJ SOLN
INTRAMUSCULAR | Status: DC | PRN
  Administered 2015-08-07: 80 ug via INTRAVENOUS

## 2015-08-07 MED ORDER — LACTATED RINGERS IV SOLN
INTRAVENOUS | Status: DC | PRN
  Administered 2015-08-07: 13:00:00 via INTRAVENOUS

## 2015-08-07 MED ORDER — TERBUTALINE SULFATE 1 MG/ML IJ SOLN
INTRAMUSCULAR | Status: AC
  Filled 2015-08-07: qty 1

## 2015-08-07 MED ORDER — SOD CITRATE-CITRIC ACID 500-334 MG/5ML PO SOLN
ORAL | Status: AC
  Filled 2015-08-07: qty 15

## 2015-08-07 MED ORDER — NALOXONE HCL 2 MG/2ML IJ SOSY
1.0000 ug/kg/h | PREFILLED_SYRINGE | INTRAVENOUS | Status: DC | PRN
  Filled 2015-08-07: qty 2

## 2015-08-07 MED ORDER — ACETAMINOPHEN 325 MG PO TABS: 650.0000 mg | ORAL_TABLET | ORAL | Status: DC | PRN

## 2015-08-07 MED ORDER — OXYTOCIN 10 UNIT/ML IJ SOLN
INTRAMUSCULAR | Status: AC
Start: 2015-08-07 — End: 2015-08-07
  Filled 2015-08-07: qty 4

## 2015-08-07 MED ORDER — SCOPOLAMINE 1 MG/3DAYS TD PT72
1.0000 | MEDICATED_PATCH | Freq: Once | TRANSDERMAL | Status: DC
  Filled 2015-08-07: qty 1

## 2015-08-07 MED ORDER — SIMETHICONE 80 MG PO CHEW
80.0000 mg | CHEWABLE_TABLET | ORAL | Status: DC
  Administered 2015-08-08 – 2015-08-11 (×4): 80 mg via ORAL
  Filled 2015-08-07 (×4): qty 1

## 2015-08-07 MED ORDER — ZOLPIDEM TARTRATE 5 MG PO TABS: 5.0000 mg | ORAL_TABLET | Freq: Every evening | ORAL | Status: DC | PRN

## 2015-08-07 MED ORDER — DIPHENHYDRAMINE HCL 50 MG/ML IJ SOLN
12.5000 mg | INTRAMUSCULAR | Status: DC | PRN
Start: 2015-08-07 — End: 2015-08-11

## 2015-08-07 MED ORDER — SODIUM CHLORIDE 0.9% FLUSH: 3.0000 mL | INTRAVENOUS | Status: DC | PRN

## 2015-08-07 MED ORDER — KETOROLAC TROMETHAMINE 30 MG/ML IJ SOLN
INTRAMUSCULAR | Status: AC
  Filled 2015-08-07: qty 1

## 2015-08-07 MED ORDER — NALBUPHINE HCL 10 MG/ML IJ SOLN: 5.0000 mg | INTRAMUSCULAR | Status: DC | PRN

## 2015-08-07 MED ORDER — SODIUM CHLORIDE 0.9 % IV SOLN
510.0000 mg | Freq: Once | INTRAVENOUS | Status: AC
  Administered 2015-08-07: 510 mg via INTRAVENOUS
  Filled 2015-08-07: qty 17

## 2015-08-07 MED ORDER — MEPERIDINE HCL 25 MG/ML IJ SOLN: 6.2500 mg | INTRAMUSCULAR | Status: DC | PRN

## 2015-08-07 MED ORDER — SENNOSIDES-DOCUSATE SODIUM 8.6-50 MG PO TABS
2.0000 | ORAL_TABLET | ORAL | Status: DC
  Administered 2015-08-08 – 2015-08-11 (×4): 2 via ORAL
  Filled 2015-08-07 (×4): qty 2

## 2015-08-07 MED ORDER — WITCH HAZEL-GLYCERIN EX PADS: 1.0000 "application " | MEDICATED_PAD | CUTANEOUS | Status: DC | PRN

## 2015-08-07 MED ORDER — SIMETHICONE 80 MG PO CHEW
80.0000 mg | CHEWABLE_TABLET | Freq: Three times a day (TID) | ORAL | Status: DC
  Administered 2015-08-07 – 2015-08-11 (×9): 80 mg via ORAL
  Filled 2015-08-07 (×9): qty 1

## 2015-08-07 MED ORDER — TERBUTALINE SULFATE 1 MG/ML IJ SOLN
0.2500 mg | Freq: Once | INTRAMUSCULAR | Status: AC
  Administered 2015-08-07: 0.25 mg via SUBCUTANEOUS

## 2015-08-07 MED ORDER — OXYCODONE-ACETAMINOPHEN 5-325 MG PO TABS
1.0000 | ORAL_TABLET | ORAL | Status: DC | PRN
  Administered 2015-08-09 – 2015-08-11 (×5): 1 via ORAL
  Filled 2015-08-07 (×3): qty 1

## 2015-08-07 MED ORDER — PHENYLEPHRINE 8 MG IN D5W 100 ML (0.08MG/ML) PREMIX OPTIME
INJECTION | INTRAVENOUS | Status: AC
  Filled 2015-08-07: qty 100

## 2015-08-07 MED ORDER — KETOROLAC TROMETHAMINE 30 MG/ML IJ SOLN
30.0000 mg | Freq: Four times a day (QID) | INTRAMUSCULAR | Status: AC | PRN
  Administered 2015-08-07: 30 mg via INTRAMUSCULAR

## 2015-08-07 MED ORDER — DIBUCAINE 1 % RE OINT: 1.0000 "application " | TOPICAL_OINTMENT | RECTAL | Status: DC | PRN

## 2015-08-07 MED ORDER — KETOROLAC TROMETHAMINE 30 MG/ML IJ SOLN: 30.0000 mg | Freq: Four times a day (QID) | INTRAMUSCULAR | Status: AC | PRN

## 2015-08-07 MED ORDER — FENTANYL CITRATE (PF) 100 MCG/2ML IJ SOLN
INTRAMUSCULAR | Status: AC
  Filled 2015-08-07: qty 2

## 2015-08-07 MED ORDER — COCONUT OIL OIL
1.0000 "application " | TOPICAL_OIL | Status: DC | PRN
  Administered 2015-08-10: 1 via TOPICAL
  Filled 2015-08-07: qty 120

## 2015-08-07 MED ORDER — OXYTOCIN 40 UNITS IN LACTATED RINGERS INFUSION - SIMPLE MED: 2.5000 [IU]/h | INTRAVENOUS | Status: AC

## 2015-08-07 MED ORDER — MORPHINE SULFATE (PF) 0.5 MG/ML IJ SOLN
INTRAMUSCULAR | Status: AC
Start: 2015-08-07 — End: 2015-08-07
  Filled 2015-08-07: qty 10

## 2015-08-07 MED ORDER — LACTATED RINGERS IV SOLN
INTRAVENOUS | Status: DC
  Administered 2015-08-08: via INTRAVENOUS

## 2015-08-07 MED ORDER — OXYTOCIN 10 UNIT/ML IJ SOLN
40.0000 [IU] | INTRAVENOUS | Status: DC | PRN
  Administered 2015-08-07: 40 [IU] via INTRAVENOUS

## 2015-08-07 MED ORDER — FENTANYL CITRATE (PF) 100 MCG/2ML IJ SOLN
INTRAMUSCULAR | Status: DC | PRN
  Administered 2015-08-07: 12.5 ug via INTRATHECAL

## 2015-08-07 MED ORDER — ONDANSETRON HCL 4 MG/2ML IJ SOLN
INTRAMUSCULAR | Status: DC | PRN
  Administered 2015-08-07: 4 mg via INTRAVENOUS

## 2015-08-07 MED ORDER — NALOXONE HCL 0.4 MG/ML IJ SOLN: 0.4000 mg | INTRAMUSCULAR | Status: DC | PRN

## 2015-08-07 MED ORDER — NALBUPHINE HCL 10 MG/ML IJ SOLN: 5.0000 mg | Freq: Once | INTRAMUSCULAR | Status: DC | PRN

## 2015-08-07 MED ORDER — BUPIVACAINE IN DEXTROSE 0.75-8.25 % IT SOLN
INTRATHECAL | Status: DC | PRN
  Administered 2015-08-07: 1.5 mL via INTRATHECAL

## 2015-08-07 MED ORDER — FENTANYL CITRATE (PF) 100 MCG/2ML IJ SOLN: 25.0000 ug | INTRAMUSCULAR | Status: DC | PRN

## 2015-08-07 MED ORDER — NALBUPHINE HCL 10 MG/ML IJ SOLN
5.0000 mg | Freq: Once | INTRAMUSCULAR | Status: DC | PRN
Start: 2015-08-07 — End: 2015-08-11

## 2015-08-07 MED ORDER — ONDANSETRON HCL 4 MG/2ML IJ SOLN
INTRAMUSCULAR | Status: AC
  Filled 2015-08-07: qty 2

## 2015-08-07 MED ORDER — IBUPROFEN 600 MG PO TABS
600.0000 mg | ORAL_TABLET | Freq: Four times a day (QID) | ORAL | Status: DC
  Administered 2015-08-08 – 2015-08-11 (×15): 600 mg via ORAL
  Filled 2015-08-07 (×15): qty 1

## 2015-08-07 MED ORDER — LACTATED RINGERS IV SOLN
INTRAVENOUS | Status: DC | PRN
Start: 2015-08-07 — End: 2015-08-07
  Administered 2015-08-07 (×2): via INTRAVENOUS

## 2015-08-07 MED ORDER — DIPHENHYDRAMINE HCL 25 MG PO CAPS: 25.0000 mg | ORAL_CAPSULE | Freq: Four times a day (QID) | ORAL | Status: DC | PRN

## 2015-08-07 MED ORDER — ONDANSETRON HCL 4 MG/2ML IJ SOLN: 4.0000 mg | Freq: Three times a day (TID) | INTRAMUSCULAR | Status: DC | PRN

## 2015-08-07 MED ORDER — CEFAZOLIN SODIUM-DEXTROSE 2-3 GM-% IV SOLR
INTRAVENOUS | Status: DC | PRN
  Administered 2015-08-07: 2 g via INTRAVENOUS

## 2015-08-07 MED ORDER — MENTHOL 3 MG MT LOZG: 1.0000 | LOZENGE | OROMUCOSAL | Status: DC | PRN

## 2015-08-07 MED ORDER — PHENYLEPHRINE 8 MG IN D5W 100 ML (0.08MG/ML) PREMIX OPTIME
INJECTION | INTRAVENOUS | Status: DC | PRN
  Administered 2015-08-07: 60 ug/min via INTRAVENOUS

## 2015-08-07 MED ORDER — PRENATAL MULTIVITAMIN CH
1.0000 | ORAL_TABLET | Freq: Every day | ORAL | Status: DC
  Administered 2015-08-08 – 2015-08-11 (×4): 1 via ORAL
  Filled 2015-08-07 (×4): qty 1

## 2015-08-07 MED ORDER — SIMETHICONE 80 MG PO CHEW: 80.0000 mg | CHEWABLE_TABLET | ORAL | Status: DC | PRN

## 2015-08-07 MED ORDER — LACTATED RINGERS IV SOLN
INTRAVENOUS | Status: DC | PRN
  Administered 2015-08-07 (×3): via INTRAVENOUS

## 2015-08-07 MED ORDER — DIPHENHYDRAMINE HCL 25 MG PO CAPS
25.0000 mg | ORAL_CAPSULE | ORAL | Status: DC | PRN
Start: 2015-08-07 — End: 2015-08-11
  Administered 2015-08-08: 25 mg via ORAL
  Filled 2015-08-07: qty 1

## 2015-08-07 MED ORDER — LACTATED RINGERS IV SOLN: INTRAVENOUS | Status: DC

## 2015-08-07 SURGICAL SUPPLY — 40 items
APL SKNCLS STERI-STRIP NONHPOA (GAUZE/BANDAGES/DRESSINGS) ×1
BARRIER ADHS 3X4 INTERCEED (GAUZE/BANDAGES/DRESSINGS) IMPLANT
BENZOIN TINCTURE PRP APPL 2/3 (GAUZE/BANDAGES/DRESSINGS) ×2 IMPLANT
BRR ADH 4X3 ABS CNTRL BYND (GAUZE/BANDAGES/DRESSINGS)
CLAMP CORD UMBIL (MISCELLANEOUS) IMPLANT
CLOSURE WOUND 1/2 X4 (GAUZE/BANDAGES/DRESSINGS) ×1
CLOTH BEACON ORANGE TIMEOUT ST (SAFETY) ×3 IMPLANT
CONTAINER PREFILL 10% NBF 15ML (MISCELLANEOUS) IMPLANT
DRSG OPSITE POSTOP 4X10 (GAUZE/BANDAGES/DRESSINGS) ×3 IMPLANT
DURAPREP 26ML APPLICATOR (WOUND CARE) ×3 IMPLANT
ELECT REM PT RETURN 9FT ADLT (ELECTROSURGICAL) ×3
ELECTRODE REM PT RTRN 9FT ADLT (ELECTROSURGICAL) ×1 IMPLANT
EXTRACTOR VACUUM M CUP 4 TUBE (SUCTIONS) IMPLANT
EXTRACTOR VACUUM M CUP 4' TUBE (SUCTIONS)
GAUZE SPONGE 4X4 12PLY STRL (GAUZE/BANDAGES/DRESSINGS) ×2 IMPLANT
GLOVE BIO SURGEON STRL SZ 6.5 (GLOVE) ×2 IMPLANT
GLOVE BIO SURGEONS STRL SZ 6.5 (GLOVE) ×1
GLOVE BIOGEL PI IND STRL 7.0 (GLOVE) ×1 IMPLANT
GLOVE BIOGEL PI INDICATOR 7.0 (GLOVE) ×2
GOWN STRL REUS W/TWL LRG LVL3 (GOWN DISPOSABLE) ×6 IMPLANT
KIT ABG SYR 3ML LUER SLIP (SYRINGE) IMPLANT
NDL HYPO 25X5/8 SAFETYGLIDE (NEEDLE) ×1 IMPLANT
NEEDLE HYPO 22GX1.5 SAFETY (NEEDLE) IMPLANT
NEEDLE HYPO 25X5/8 SAFETYGLIDE (NEEDLE) ×3 IMPLANT
NS IRRIG 1000ML POUR BTL (IV SOLUTION) ×3 IMPLANT
PACK C SECTION WH (CUSTOM PROCEDURE TRAY) ×3 IMPLANT
PAD ABD 8X7 1/2 STERILE (GAUZE/BANDAGES/DRESSINGS) ×2 IMPLANT
PAD OB MATERNITY 4.3X12.25 (PERSONAL CARE ITEMS) ×3 IMPLANT
PENCIL SMOKE EVAC W/HOLSTER (ELECTROSURGICAL) ×3 IMPLANT
STRIP CLOSURE SKIN 1/2X4 (GAUZE/BANDAGES/DRESSINGS) ×1 IMPLANT
SUT CHROMIC 0 CTX 36 (SUTURE) ×14 IMPLANT
SUT PLAIN 0 NONE (SUTURE) IMPLANT
SUT PLAIN 2 0 XLH (SUTURE) IMPLANT
SUT VIC AB 0 CT1 27 (SUTURE) ×9
SUT VIC AB 0 CT1 27XBRD ANBCTR (SUTURE) ×3 IMPLANT
SUT VIC AB 4-0 KS 27 (SUTURE) ×2 IMPLANT
SYR 30ML LL (SYRINGE) ×2 IMPLANT
SYR CONTROL 10ML LL (SYRINGE) IMPLANT
TOWEL OR 17X24 6PK STRL BLUE (TOWEL DISPOSABLE) ×3 IMPLANT
TRAY FOLEY CATH SILVER 14FR (SET/KITS/TRAYS/PACK) ×3 IMPLANT

## 2015-08-07 NOTE — Progress Notes (Signed)
Patient is doing well. No complaints.  No bright red bleeding.  BP 99/49 mmHg  Pulse 82  Temp(Src) 98.1 F (36.7 C) (Oral)  Resp 20  Ht 5\' 7"  (1.702 m)  Wt 97.433 kg (214 lb 12.8 oz)  BMI 33.63 kg/m2  SpO2 98%  Results for orders placed or performed during the hospital encounter of 08/03/15 (from the past 24 hour(s))  Type and [facility]     Status: None   Collection Time: 08/06/15  9:58 PM  Result Value Ref Range   ABO/RH(D) O POS    Antibody Screen NEG    Sample Expiration 08/09/2015    Abdomen is soft and non tender  IMPRESSION: IUP at 35 w 2 days Central Placenta Previa  PLAN: Continue Present plan Consider C Section at 36 weeks because of recent bleed

## 2015-08-07 NOTE — Transfer of Care (Signed)
Immediate Anesthesia Transfer of Care Note  Patient: Tanya [name]  Procedure(s) Performed: Procedure(s): CESAREAN SECTION (N/A)  Patient Location: PACU  Anesthesia Type:Spinal  Level of Consciousness: awake and alert   Airway & Oxygen Therapy: Patient Spontanous Breathing  Post-op Assessment: Report given to RN and Post -op Vital signs reviewed and stable  Post vital signs: Reviewed  Last Vitals:  Filed Vitals:   08/07/15 0004 08/07/15 0826  BP: 99/49 112/59  Pulse: 82 82  Temp: 36.7 C 36.8 C  Resp: 20 18    Last Pain:  Filed Vitals:   08/07/15 1326  PainSc: 0-No pain      Patients Stated Pain Goal: 3 (08/04/15 2008)  Complications: No apparent anesthesia complications

## 2015-08-07 NOTE — Anesthesia Procedure Notes (Signed)
Spinal Patient location during procedure: OR Staffing Anesthesiologist: Lorie Cleckley Performed by: anesthesiologist  Preanesthetic Checklist Completed: patient identified, site marked, surgical consent, pre-op evaluation, timeout performed, IV checked, risks and benefits discussed and monitors and equipment checked Spinal Block Patient position: sitting Prep: ChloraPrep Patient monitoring: heart rate, continuous pulse ox and blood pressure Approach: midline Location: L4-5 Injection technique: single-shot Needle Needle type: Sprotte  Needle gauge: 24 G Needle length: 9 cm Additional Notes Expiration date of kit checked and confirmed. Patient tolerated procedure well, without complications.     

## 2015-08-07 NOTE — Progress Notes (Signed)
Patient reported at 11:20am that she passed a dark red/brown colored clot when she got up to bathroom. No bright red bleeding noted. Patient has dark brownish vaginal discharge. Patient has had UCx4 in 25 minutes with uterine irritability. Patent reports that she is feeling the UC's and is having lower abdominal cramping. MD aware.

## 2015-08-07 NOTE — Brief Op Note (Signed)
08/03/2015 - 08/07/2015  1:43 PM  PATIENT:  Tanya [name]  35 y.o. female  PRE-OPERATIVE DIAGNOSIS:   IUP at 3135 w 2 days Placenta Previa  POST-OPERATIVE DIAGNOSIS:   Same  PROCEDURE:  Procedure(s): CESAREAN SECTION (N/A)  SURGEON:  Surgeon(s) and Role:    * Marcelle OverlieMichelle Adyline Huberty, MD - Primary  PHYSICIAN ASSISTANT:   ASSISTANTS: none   ANESTHESIA:   spinal  EBL:  Total I/O In: 2500 [I.V.:2500] Out: 1000 [Blood:1000]  BLOOD ADMINISTERED:none  DRAINS: Urinary Catheter (Foley)   LOCAL MEDICATIONS USED:  NONE  SPECIMEN:  Source of Specimen:  placenta  DISPOSITION OF SPECIMEN:  PATHOLOGY  COUNTS:  YES  TOURNIQUET:  * No tourniquets in log *  DICTATION: .Other Dictation: Dictation Number [id number]  PLAN OF CARE: Admit to inpatient   PATIENT DISPOSITION:  PACU - hemodynamically stable.   Delay start of Pharmacological VTE agent (>24hrs) due to surgical blood loss or risk of bleeding: not applicable

## 2015-08-07 NOTE — Progress Notes (Signed)
Patient has been very stable with only scant brown spotting until about 30 minutes ago when she passed a clot that was at least 50 cent size. She has scant spotting now but "feels" like she is bleeding . She also noticed much stronger contractions and the terbutaline has helped a little.  I had an extensive discussion with the patient.  Given gestational age, I do not think that is worth the risk to wait until she starts hemorrhaging. She is very anxious and she agrees.  Recommend Primary C Section with epidural.  I also discussed with her possibility of blood transfusion and hysterectomy.  She is aware of that risk. Consent is signed. OR is notified. We will wait for her husband to get her before going to the OR

## 2015-08-07 NOTE — Progress Notes (Signed)
Patient c/o incisional pain.  Dr. Acey Lavarignan notified and orders received to begin giving Percocet and continue monitoring patient.

## 2015-08-07 NOTE — Op Note (Signed)
NAME:  Tanya [name], Tanya                 ACCOUNT NO.:  [id number]  MEDICAL RECORD NO.:  [id number]  LOCATION:  [facility]                          FACILITY:  [facility]  PHYSICIAN:  Reign Dziuba L. Mitchell Iwanicki, M.D.DATE OF BIRTH:  14-May-1980  DATE OF PROCEDURE:  08/07/2015 DATE OF DISCHARGE:                              OPERATIVE REPORT   PREOPERATIVE DIAGNOSES:  Intrauterine pregnancy at 35 weeks and 2 days and placenta previa.  POSTOPERATIVE DIAGNOSES:  Intrauterine pregnancy at 35 weeks and 2 days and placenta previa.  PROCEDURE:  Primary low transverse cesarean section.  SURGEONS:  Krrish Freund L. Vincente PoliGrewal, M.D.  ANESTHESIA:  Spinal.  ESTIMATED BLOOD LOSS:  1000 mL.  COMPLICATIONS:  None.  DRAINS:  Foley catheter.  PATHOLOGY:  Placenta.  DESCRIPTION OF PROCEDURE:  The patient was taken to the operating room. Her spinal was placed.  She was then prepped and draped in usual sterile fashion.  Foley catheter was inserted.  A low-transverse incision was made, carried down to the fascia.  Fascia scored in the midline and extended laterally.  The rectus muscles were separated in the midline. The peritoneum was entered bluntly.  The peritoneal incision was then stretched.  The lower uterine segment was identified.  The bladder flap was created digitally and the bladder flap was then pushed to side.  A low-transverse incision was made in the uterus.  The placenta was anterior.  I had to cut through the placenta.  I then reached into the uterine cavity, and I could feel the baby's feet.  I could also feel the baby was moving towards the fundus, so I grabbed the feet and did a complete breech extraction very easily through the placenta.  The baby was a female infant.  Cord was clamped and cut.  The baby was handed to the neonatal team.  The placenta was removed easily.  There was no abnormal adherence noted.  It was sent to pathology.  The uterus was cleared of all clots and debris.  After it was exteriorized  through the very large arterial bleeder on the left corner of the incision, which took some time for me to mobilize, and I think this was attributed to the patient's blood loss.  I had to place a Kelly clamp across that and then suture ligated using 0 Vicryl suture.  We then closed the incision using 2 layers using 0 chromic in a running, locked stitch.  Hemostasis was very good.  The uterus contracted down really well.  The uterus was returned to the abdomen.  Irrigation was performed.  The peritoneum was closed using 0 Vicryl.  The fascia was closed using 0 Vicryl in running stitch starting each corner and meeting in the midline.  After irrigation of subcutaneous layer, the skin was closed with a 4-0 Vicryl on a Keith needle.  All sponge, lap, and instrument counts were correct x2.  The patient went to recovery room in stable condition.     Tanya [name] L. Vincente PoliGrewal, M.D.     Tanya AversMLG/MEDQ  D:  08/07/2015  T:  08/07/2015  Job:  [id number]

## 2015-08-07 NOTE — Anesthesia Preprocedure Evaluation (Signed)
Anesthesia Evaluation  Patient identified by MRN, date of birth, ID band Patient awake    Reviewed: Allergy & Precautions, NPO status , Patient's Chart, lab work & pertinent test results  Airway Mallampati: II  TM Distance: >3 FB Neck ROM: Full    Dental no notable dental hx.    Pulmonary neg pulmonary ROS,    Pulmonary exam normal breath sounds clear to auscultation       Cardiovascular negative cardio ROS Normal cardiovascular exam Rhythm:Regular Rate:Normal     Neuro/Psych negative neurological ROS  negative psych ROS   GI/Hepatic negative GI ROS, Neg liver ROS,   Endo/Other  negative endocrine ROS  Renal/GU negative Renal ROS  negative genitourinary   Musculoskeletal negative musculoskeletal ROS (+)   Abdominal   Peds negative pediatric ROS (+)  Hematology negative hematology ROS (+)   Anesthesia Other Findings   Reproductive/Obstetrics (+) Pregnancy                             Anesthesia Physical Anesthesia Plan  ASA: II  Anesthesia Plan: Spinal   Post-op Pain Management:    Induction:   Airway Management Planned: Natural Airway  Additional Equipment:   Intra-op Plan:   Post-operative Plan:   Informed Consent: I have reviewed the patients History and Physical, chart, labs and discussed the procedure including the risks, benefits and alternatives for the proposed anesthesia with the patient or authorized representative who has indicated his/her understanding and acceptance.   Dental advisory given  Plan Discussed with: CRNA  Anesthesia Plan Comments:         Anesthesia Quick Evaluation  

## 2015-08-08 LAB — CBC
HCT: 23.5 % — ABNORMAL LOW (ref 36.0–46.0)
HEMOGLOBIN: 7.9 g/dL — AB (ref 12.0–15.0)
MCH: 28.7 pg (ref 26.0–34.0)
MCHC: 33.6 g/dL (ref 30.0–36.0)
MCV: 85.5 fL (ref 78.0–100.0)
PLATELETS: 213 10*3/uL (ref 150–400)
RBC: 2.75 MIL/uL — ABNORMAL LOW (ref 3.87–5.11)
RDW: 13.2 % (ref 11.5–15.5)
WBC: 15.1 10*3/uL — ABNORMAL HIGH (ref 4.0–10.5)

## 2015-08-08 LAB — RPR: RPR: NONREACTIVE

## 2015-08-08 NOTE — Anesthesia Postprocedure Evaluation (Signed)
Anesthesia Post Note  Patient: Tanya [name]  Procedure(s) Performed: Procedure(s) (LRB): CESAREAN SECTION (N/A)  Patient location during evaluation: [facility] Anesthesia Type: Spinal Level of consciousness: awake, awake and alert, oriented and patient cooperative Pain management: pain level controlled Vital Signs Assessment: post-procedure vital signs reviewed and stable Respiratory status: spontaneous breathing, nonlabored ventilation and respiratory function stable Cardiovascular status: stable Postop Assessment: no headache, no backache, patient able to bend at knees and no signs of nausea or vomiting Anesthetic complications: no     Last Vitals:  Filed Vitals:   08/08/15 0012 08/08/15 0409  BP: 106/54 105/68  Pulse: 80 72  Temp: 37.3 C 36.8 C  Resp: 18 18    Last Pain:  Filed Vitals:   08/08/15 0954  PainSc: 2    Pain Goal: Patients Stated Pain Goal: 2 (08/08/15 0700)               Kaegan Stigler L

## 2015-08-08 NOTE — Progress Notes (Signed)
Subjective: Postpartum Day 1: Cesarean Delivery Patient reports tolerating PO and + flatus.   Denies SOB, or dizziness Objective: Vital signs in last 24 hours: Temp:  [97.5 F (36.4 C)-99.2 F (37.3 C)] 98.2 F (36.8 C) (06/26 0409) Pulse Rate:  [71-87] 72 (06/26 0409) Resp:  [11-20] 18 (06/26 0409) BP: (105-124)/(54-80) 105/68 mmHg (06/26 0409) SpO2:  [97 %-100 %] 99 % (06/26 0409)  Physical Exam:  General: alert and cooperative Lochia: appropriate Uterine Fundus: firm Incision: small old drainage noted on bandage DVT Evaluation: No evidence of DVT seen on physical exam. Negative Homan's sign. No cords or calf tenderness. No significant calf/ankle edema.   Recent Labs  08/07/15 1208 08/08/15 0508  HGB 9.3* 7.9*  HCT 27.7* 23.5*    Assessment/Plan: Status post Cesarean section. Postoperative course complicated by anemia  Continue current care, received Fe infusion yesterday.  Jaivian Battaglini G 08/08/2015, 8:03 AM

## 2015-08-08 NOTE — Lactation Note (Signed)
This note was copied from a baby's chart. Lactation Consultation Note  Assisted mom with first breastfeeding attempt in NICU.  Baby is 22 hours old and 35.2 weeks.  Mom can easily hand express colostrum.  Baby sleepy but did latch and take 5-6 good sucks before falling asleep.  Vital signs WNL during feeding attempt.  Reminded mom feedings will be inconsistent until at least term.  Mom holding baby skin to skin on chest.  Patient Name: [name] ZOXWR'UToday's Date: 08/08/2015 Reason for consult: Follow-up assessment;NICU baby   Maternal Data    Feeding Feeding Type: Breast Fed  LATCH Score/Interventions Latch: Repeated attempts needed to sustain latch, nipple held in mouth throughout feeding, stimulation needed to elicit sucking reflex.  Audible Swallowing: None  Type of Nipple: Everted at rest and after stimulation  Comfort (Breast/Nipple): Soft / non-tender     Hold (Positioning): Assistance needed to correctly position infant at breast and maintain latch. Intervention(s): Breastfeeding basics reviewed;Support Pillows;Position options;Skin to skin  LATCH Score: 6  Lactation Tools Discussed/Used     Consult Status      Huston FoleyMOULDEN, Glendia Olshefski S 08/08/2015, 11:51 AM

## 2015-08-08 NOTE — Lactation Note (Signed)
This note was copied from a baby's chart. Lactation Consultation Note  Mom is pumping every 3 hours and obtaining a few mls of colostrum.  She plans on putting baby to breast today.  Reviewed preterm feeding norm and reassured baby will become more efficient as she reaches term.  Mom brought nipple shields from home.  I explained this is many times a good tool for preterm babies but allow the baby to go directly to breast the first few days if possible.  Mom states her milk never came in with first baby.  Milk did come in with second baby but she developed mastitis and quit nursing.  Encouraged to call with questions/assist.  Patient Name: Girl Mikael SprayRaven Brennen Today's Date: 08/08/2015     Maternal Data    Feeding    LATCH Score/Interventions                      Lactation Tools Discussed/Used     Consult Status      Huston FoleyMOULDEN, Deran Barro S 08/08/2015, 10:30 AM

## 2015-08-09 LAB — CBC
HEMATOCRIT: 25.7 % — AB (ref 36.0–46.0)
HEMOGLOBIN: 8.5 g/dL — AB (ref 12.0–15.0)
MCH: 28.4 pg (ref 26.0–34.0)
MCHC: 33.1 g/dL (ref 30.0–36.0)
MCV: 86 fL (ref 78.0–100.0)
Platelets: 276 10*3/uL (ref 150–400)
RBC: 2.99 MIL/uL — AB (ref 3.87–5.11)
RDW: 13.6 % (ref 11.5–15.5)
WBC: 15.2 10*3/uL — AB (ref 4.0–10.5)

## 2015-08-09 MED ORDER — SERTRALINE HCL 50 MG PO TABS
50.0000 mg | ORAL_TABLET | Freq: Every day | ORAL | Status: DC
Start: 2015-08-09 — End: 2015-08-11
  Administered 2015-08-09 – 2015-08-11 (×3): 50 mg via ORAL
  Filled 2015-08-09 (×4): qty 1

## 2015-08-09 MED ORDER — FERROUS SULFATE 325 (65 FE) MG PO TABS
325.0000 mg | ORAL_TABLET | Freq: Three times a day (TID) | ORAL | Status: DC
  Administered 2015-08-09 – 2015-08-11 (×7): 325 mg via ORAL
  Filled 2015-08-09 (×7): qty 1

## 2015-08-09 MED ORDER — PANTOPRAZOLE SODIUM 40 MG PO TBEC
40.0000 mg | DELAYED_RELEASE_TABLET | Freq: Every day | ORAL | Status: DC
  Administered 2015-08-09 – 2015-08-11 (×3): 40 mg via ORAL
  Filled 2015-08-09 (×3): qty 1

## 2015-08-09 NOTE — Lactation Note (Signed)
This note was copied from a baby'[name] chart. Lactation Consultation Note  Patient Name: Tanya [name] ZOXWR'UToday'[name] Date: 08/09/2015 Reason for consult: Follow-up assessment;NICU baby;Late preterm infant;Infant < 6lbs   Follow up with mom in her room. She reports infant is doing well and that she has been going to NICU to BF the infant. Mom reports the infant is latching on but not sure how much the baby is getting. She reports she is pumping every 3 hours and getting small amounts of colostrum. She will be needing a 2 week pump rental before d/c.Marland Kitchen. She is ordering a pump for home from her insurance company. Mom reports her breasts feel a little fuller. She reports pain around edge of nipple and is going to change to # 27 flanges. Follow up with mom tomorrow.    Maternal Data Has patient been taught Hand Expression?: Yes  Feeding Feeding Type: Formula Nipple Type: Slow - flow Length of feed: 5 min  LATCH Score/Interventions                      Lactation Tools Discussed/Used WIC Program: No Pump Review: Setup, frequency, and cleaning;Milk Storage Initiated by:: Reviewed   Consult Status Consult Status: Follow-up Date: 08/10/15 Follow-up type: In-patient    Tanya [name] Satya Buttram 08/09/2015, 6:18 PM

## 2015-08-09 NOTE — Progress Notes (Signed)
Subjective: Postpartum Day 2: Cesarean Delivery Patient reports tolerating PO, + flatus and no problems voiding.    Objective: Vital signs in last 24 hours: Temp:  [98.2 F (36.8 C)-99.7 F (37.6 C)] 98.2 F (36.8 C) (06/27 0551) Pulse Rate:  [72-109] 72 (06/27 0551) Resp:  [16-18] 16 (06/27 0551) BP: (111-125)/(55-67) 111/66 mmHg (06/27 0551) SpO2:  [98 %-99 %] 98 % (06/27 0551)  Physical Exam:  General: alert and cooperative Lochia: appropriate Uterine Fundus: firm Incision: healing well DVT Evaluation: No evidence of DVT seen on physical exam. Negative Homan's sign. No cords or calf tenderness. No significant calf/ankle edema.   Recent Labs  08/07/15 1208 08/08/15 0508  HGB 9.3* 7.9*  HCT 27.7* 23.5*    Assessment/Plan: Status post Cesarean section. Postoperative course complicated by anemia  Feso4 with meals Restart zoloft.  Lopez Dentinger G 08/09/2015, 8:43 AM

## 2015-08-09 NOTE — Clinical Documentation Improvement (Addendum)
OB/GYN  Please document query responses in the progress notes and discharge summary, not on the [facility] Thank you!  Please document if a condition below provides greater specificity regarding the patient's "Anemia":  - Postoperative Course complicated by Acute Blood Loss Anemia  - other condition  - unable to clinically determine  Clinical Information: Dictated operative note EBL:  1000 ml "Postoperative course complicated by anemia.  Continue current care, received Fe infusion yesterday." is documented in the progress notes by Julio Sicksarol Curtis, NP and co-signed by Dr. Marcelle OverlieHolland 08/08/15. Current medication include: Ferrous Sulfate 325 mg tid with meals starting 08/09/15.  CBC trend this admission: Component     Latest Ref Rng 08/03/2015 08/04/2015 08/07/2015 08/08/2015  WBC     4.0 - 10.5 K/uL 10.9 (H) 13.8 (H) 10.3 15.1 (H)  RBC     3.87 - 5.11 MIL/uL 3.67 (L) 3.41 (L) 3.23 (L) 2.75 (L)  Hemoglobin     12.0 - 15.0 g/dL 30.810.5 (L) 65.710.0 (L) 9.3 (L) 7.9 (L)  HCT     36.0 - 46.0 % 31.2 (L) 29.8 (L) 27.7 (L) 23.5 (L)  Platelets     150 - 400 K/uL 246 252 243 213     Please exercise your independent, professional judgment when responding. A specific answer is not anticipated or expected.   Thank You, Jerral Ralphathy R Jasher Barkan  RN BSN CCDS [phone] Health Information Management [place]

## 2015-08-10 LAB — TYPE AND SCREEN
ABO/RH(D): O POS
Antibody Screen: NEGATIVE
UNIT DIVISION: 0
UNIT DIVISION: 0
Unit division: 0
Unit division: 0

## 2015-08-10 NOTE — Progress Notes (Signed)
Subjective: Postpartum Day 3: Cesarean Delivery Patient reports tolerating PO, + flatus and no problems voiding.  Baby doing well in NICU.  Objective: Vital signs in last 24 hours: Temp:  [98.2 F (36.8 C)-98.8 F (37.1 C)] 98.4 F (36.9 C) (06/28 0538) Pulse Rate:  [74-101] 74 (06/28 0538) Resp:  [16-18] 16 (06/28 0538) BP: (115-121)/(57-66) 121/64 mmHg (06/28 0538) SpO2:  [98 %-99 %] 98 % (06/28 0538)  Physical Exam:  General: alert, cooperative and appears stated age Lochia: appropriate Uterine Fundus: firm Incision: healing well, no significant drainage, no dehiscence DVT Evaluation: No evidence of DVT seen on physical exam. Negative Homan's sign. No cords or calf tenderness.   Recent Labs  08/08/15 0508 08/09/15 1247  HGB 7.9* 8.5*  HCT 23.5* 25.7*    Assessment/Plan: Status post Cesarean section. Doing well postoperatively.  Continue current care. Acute blood loss on chronic anemia-Fe SO4  Chantell Kunkler 08/10/2015, 8:36 AM

## 2015-08-10 NOTE — Lactation Note (Signed)
This note was copied from a baby's chart. Lactation Consultation Note  Patient Name: Tanya [name] ZOXWR'UToday's Date: 08/10/2015 Reason for consult: Follow-up assessment;NICU baby;Infant < 6lbs;Late preterm infant   Spoke with mother in her room. She reports she is BF infant for some feeds. She reports she is pumping and not getting much volume yet but is feeling a lot fuller today. Enc mom to pump every 2-3 hours for 15 minutes followed by hand expression. Mom is planning to rent a pump for 2 weeks, pump rental paperwork left at bedside. Enc mom to call with questions/concerns prn.   Maternal Data Formula Feeding for Exclusion: No Has patient been taught Hand Expression?: Yes  Feeding Feeding Type: Formula Nipple Type: Slow - flow Length of feed: 15 min  LATCH Score/Interventions                      Lactation Tools Discussed/Used WIC Program: No Pump Review: Setup, frequency, and cleaning   Consult Status Consult Status: Follow-up Date: 08/11/15 Follow-up type: In-patient    Tanya [name] 08/10/2015, 1:44 PM

## 2015-08-11 MED ORDER — OXYCODONE-ACETAMINOPHEN 5-325 MG PO TABS: 2.0000 | ORAL_TABLET | ORAL | Status: DC | PRN

## 2015-08-11 MED ORDER — IBUPROFEN 600 MG PO TABS: 600.0000 mg | ORAL_TABLET | Freq: Four times a day (QID) | ORAL | Status: DC

## 2015-08-11 NOTE — Progress Notes (Signed)
Discharge teaching complete. Pt understood all instructions and did not have any questions. Pt ambulated out of the hospital and discharged home to family.  

## 2015-08-11 NOTE — Lactation Note (Signed)
This note was copied from a baby's chart. Lactation Consultation Note  Patient Name: Tanya [name] ZOXWR'UToday's Date: 08/11/2015 Reason for consult: Follow-up assessment;NICU baby  NICU baby 4095 hours old. Mom reports that she has been pumping and getting 30 ml EBM combined from both breast. Mom given 2-week rental pump. Mom states that she had issues with supply with first child and mastitis with second child. Enc mom to continue pumping every 2-3 hours followed by hand expression. Enc mom to put baby to breast as able. Mom requested a second set of #27 flanges since she will be pumping at home and the hospital--and they were given. Mom states that her nipples are more comfortable now that she is using coconut oil as well. Mom aware of pumping rooms in NICU. Mom aware of OP/BFSG and LC phone line assistance after D/C. Enc mom to call for assistance as needed with latching baby in NICU. Maternal Data    Feeding Feeding Type: Breast Milk with Formula added Nipple Type: Slow - flow Length of feed: 20 min  LATCH Score/Interventions                      Lactation Tools Discussed/Used     Consult Status Consult Status: PRN    Geralynn OchsWILLIARD, Lorrane Mccay 08/11/2015, 12:30 PM

## 2015-08-11 NOTE — Discharge Summary (Signed)
Obstetric Discharge Summary Reason for Admission: observation/evaluation Prenatal Procedures: ultrasound Intrapartum Procedures: cesarean: low cervical, transverse Postpartum Procedures: none Complications-Operative and Postpartum: none HEMOGLOBIN  Date Value Ref Range Status  08/09/2015 8.5* 12.0 - 15.0 g/dL Final   HCT  Date Value Ref Range Status  08/09/2015 25.7* 36.0 - 46.0 % Final    Physical Exam:  General: alert and cooperative Lochia: appropriate Uterine Fundus: firm Incision: healing well DVT Evaluation: No evidence of DVT seen on physical exam. Negative Homan's sign. No cords or calf tenderness. No significant calf/ankle edema.  Discharge Diagnoses: s/p cesarean  @ 35 weeks, placental previa  Discharge Information: Date: 08/11/2015 Activity: pelvic rest Diet: routine Medications: Ibuprofen , Percocet and Zoloft and PNV Condition: stable Instructions: refer to practice specific booklet Discharge to: home   Newborn Data: Live born female  Birth Weight: 5 lb 12.8 oz (2630 g) APGAR: 4, 5  Baby continues in NICU  CURTIS,CAROL G 08/11/2015, 9:24 AM

## 2015-08-12 ENCOUNTER — Inpatient Hospital Stay (HOSPITAL_COMMUNITY)
Admission: AD | Admit: 2015-08-12 | Discharge: 2015-08-12 | Disposition: A | Discharge status: Home / Self Care | Payer: BLUE CROSS/BLUE SHIELD | Source: Ambulatory Visit | Attending: Obstetrics and Gynecology | Admitting: Obstetrics and Gynecology

## 2015-08-12 DIAGNOSIS — D649 Anemia, unspecified: Secondary | ICD-10-CM | POA: Diagnosis not present

## 2015-08-12 DIAGNOSIS — O9989 Other specified diseases and conditions complicating pregnancy, childbirth and the puerperium: Secondary | ICD-10-CM | POA: Insufficient documentation

## 2015-08-12 DIAGNOSIS — K5900 Constipation, unspecified: Secondary | ICD-10-CM | POA: Diagnosis not present

## 2015-08-12 DIAGNOSIS — K5641 Fecal impaction: Secondary | ICD-10-CM | POA: Diagnosis present

## 2015-08-12 DIAGNOSIS — O9081 Anemia of the puerperium: Secondary | ICD-10-CM | POA: Insufficient documentation

## 2015-08-12 LAB — CBC
HCT: 25.3 % — ABNORMAL LOW (ref 36.0–46.0)
Hemoglobin: 8.3 g/dL — ABNORMAL LOW (ref 12.0–15.0)
MCH: 29.3 pg (ref 26.0–34.0)
MCHC: 32.8 g/dL (ref 30.0–36.0)
MCV: 89.4 fL (ref 78.0–100.0)
PLATELETS: 303 10*3/uL (ref 150–400)
RBC: 2.83 MIL/uL — AB (ref 3.87–5.11)
RDW: 15.1 % (ref 11.5–15.5)
WBC: 11.8 10*3/uL — ABNORMAL HIGH (ref 4.0–10.5)

## 2015-08-12 MED ORDER — LIDOCAINE HCL 2 % EX GEL
1.0000 "application " | Freq: Once | CUTANEOUS | Status: AC
  Administered 2015-08-12: 1 via TOPICAL
  Filled 2015-08-12: qty 5

## 2015-08-12 NOTE — MAU Note (Signed)
Pt states that she delivered by c/s on 6/25 and states that she continuously has the urge to have a bowel movement. Pt states that she had on large, formed stool yesterday 6/29 but the urge to pass more stool remains. Pt states that she has a history of a rectocele. Carmelina DaneERRI L Shaletta Hinostroza, RN

## 2015-08-12 NOTE — MAU Note (Signed)
C/s on Sunday. Had BM last night.  Feeling urge, has tried to manually remove, tried glycerin suppository, drank bottle of mag citrate this morning. Nothing is working to help pass stool. Has a rectocele. Called office and was told to come here.

## 2015-08-12 NOTE — MAU Provider Note (Signed)
History     CSN: [id number]  Arrival date and time: 08/12/15 1146   None     Chief Complaint  Patient presents with  . Fecal Impaction   HPI  Pt is 5 days(08/07/2015) PP [id number] after del by C/S premature viable female due to placenta abruption.   Pt has been extremely constipated and uncomfortable due to stool in rectum.  Pt has tried manually removing stool, p[artly successful, glycerin supp, mag citrate. Pt tried enema but it burned rectum so much, she was unable to tolerate. Pt took one Percocet this morning due to rectal pain. Pt is also on iron due to large amount of blood loss at delivery.  Pt is taking a stool softener and also benefiber Pt is recovering well from C/S. Pt is pumping and baby still in NICU  RN note  Expand All Collapse All   C/s on Sunday. Had BM last night. Feeling urge, has tried to manually remove, tried glycerin suppository, drank bottle of mag citrate this morning. Nothing is working to help pass stool. Has a rectocele. Called office and was told to come here.         Past Medical History  Diagnosis Date  . No pertinent past medical history   . Tachycardia   . History of chicken pox   . Tilted uterus   . Anemia     postpartum  . IBS (irritable bowel syndrome)   . History of pyelonephritis     as child  . ADHD (attention deficit hyperactivity disorder)   . Seasonal allergies   . GERD (gastroesophageal reflux disease)   . Infection     UTI    Past Surgical History  Procedure Laterality Date  . No past surgeries    . Dilate and curettage  2016  . Cesarean section N/A 08/07/2015    Procedure: CESAREAN SECTION;  Surgeon: Marcelle OverlieMichelle Grewal, MD;  [facility];  Service: Obstetrics;  Laterality: N/A;    Family History  Problem Relation Age of Onset  . Hypertension Mother   . Hypothyroidism Mother   . Thyroid disease Mother   . Cancer Maternal Grandfather     breast  . Lupus Paternal Grandmother   . Diabetes Paternal  Grandfather     Social History  Substance Use Topics  . Smoking status: Never Smoker   . Smokeless tobacco: Never Used  . Alcohol Use: No    Allergies: No Known Allergies  Prescriptions prior to admission  Medication Sig Dispense Refill Last Dose  . ibuprofen (ADVIL,MOTRIN) 600 MG tablet Take 1 tablet (600 mg total) by mouth every 6 (six) hours. 30 tablet 1   . oxyCODONE-acetaminophen (PERCOCET/ROXICET) 5-325 MG tablet Take 2 tablets by mouth every 4 (four) hours as needed (pain scale > 7). 30 tablet 0   . Prenatal Vit-Fe Fumarate-FA (PRENATAL MULTIVITAMIN) TABS tablet Take 1 tablet by mouth daily at 12 noon.    08/02/2015 at Unknown time  . sertraline (ZOLOFT) 50 MG tablet Take 50 mg by mouth daily.   08/02/2015 at Unknown time    Review of Systems  Constitutional: Negative for fever and chills.  Gastrointestinal: Positive for constipation. Negative for nausea, vomiting, abdominal pain and diarrhea.  Genitourinary: Negative for dysuria and urgency.  Neurological: Negative for headaches.   Physical Exam   Blood pressure 133/73, pulse 78, temperature 98.6 F (37 C), temperature source Oral, resp. rate 16, currently breastfeeding.  Physical Exam  Nursing note and vitals reviewed. Constitutional: She is  oriented to person, place, and time. She appears well-developed and well-nourished. No distress.  HENT:  Head: Normocephalic.  Eyes: Pupils are equal, round, and reactive to light.  Neck: Normal range of motion. Neck supple.  Cardiovascular: Normal rate.   Respiratory: Effort normal.  GI: Soft. She exhibits no distension. There is no tenderness. There is no rebound and no guarding.  Waffle bandage intact; no active incisional bleeding; Incision healing well  Musculoskeletal: Normal range of motion.  Neurological: She is alert and oriented to person, place, and time.  Skin: Skin is warm and dry. There is pallor.  Psychiatric: She has a normal mood and affect.    MAU Course   Procedures Results for orders placed or performed during the hospital encounter of 08/12/15 (from the past 24 hour(s))  CBC     Status: Abnormal   Collection Time: 08/12/15  1:31 PM  Result Value Ref Range   WBC 11.8 (H) 4.0 - 10.5 K/uL   RBC 2.83 (L) 3.87 - 5.11 MIL/uL   Hemoglobin 8.3 (L) 12.0 - 15.0 g/dL   HCT 16.125.3 (L) 09.636.0 - 04.546.0 %   MCV 89.4 78.0 - 100.0 fL   MCH 29.3 26.0 - 34.0 pg   MCHC 32.8 30.0 - 36.0 g/dL   RDW 40.915.1 81.111.5 - 91.415.5 %   Platelets 303 150 - 400 K/uL  hemoglobin stable 8.3 Hemoglobin 8.5 at discharge 3 days ago Soap Suds Enema given with good results Pt comfortable and ready to go to NICU to see pt Dr. Marcelle OverlieHolland aware Assessment and Plan  PP/C-Section withConstipation - relieved with SS enema Advised pt to continue stool softener and benefiber- can increase if needed High fiber diet F/u with appointment as scheduled Sooner if increase in pain, bleeding, chills, fever Anemia- continue iron supplement   Tanya [name] 08/12/2015, 1:18 PM

## 2015-08-13 ENCOUNTER — Ambulatory Visit: Payer: Self-pay

## 2015-08-13 NOTE — Lactation Note (Signed)
This note was copied from a baby's chart. Lactation Consultation Note  Patient Name: Tanya [name] [id number] Date: 08/13/2015 Reason for consult: Follow-up assessment;NICU baby;Infant < 6lbs;Late preterm infant   Follow up with mom in NICU for feeding assessment at moms request. Mom had infant STS attempting to latch in football hold to left breast. Mom with filling breasts and long everted nipples. Infant would attempt to latch and was unable to sustain latch despite several tries. # 20 NS applied and was primed with EBM. Infant would latch and suckle intermittently. Tried #24 NS and infant able to sustain latch better although she was not actively nursing. Discussed BF basics and positioning.   Discussed with mom NL LPT infant feeding behavior. Infant is bottle and NG feeding as well as BF about 3 x a day. It is felt infant may be ready for d/c next week. Enc mom to offer infant breast as she is able to be here to allow infant to practice. Mom understood that although [name] suckled some at the breast, she was not transferring much milk effectively at this feeding. Advised mom that if infant is not nutritivly suckling at the breast to allow her to BF for about 20 minutes and then offer supplement as to not tire infant out.   Mom did well with positioning and stimulating infant for feeding. Enc her to keep trying. Mom is pumping about every 2-3 hours and is seeing an increase in volume. She is not quite at infants feeding needs. Discussed power pumping with mom. Mom requested another DEBP kit, gave her one. She is using a Symphony at home and when visiting the NICU.   Enc mom to call with questions/concerns. Follow up PRN.      Maternal Data Formula Feeding for Exclusion: No Has patient been taught Hand Expression?: Yes Does the patient have breastfeeding experience prior to this delivery?: Yes  Feeding Feeding Type: Breast Fed Length of feed: 5 min  LATCH Score/Interventions Latch:  Repeated attempts needed to sustain latch, nipple held in mouth throughout feeding, stimulation needed to elicit sucking reflex. Intervention(s): Skin to skin;Teach feeding cues;Waking techniques Intervention(s): Breast compression;Breast massage  Audible Swallowing: A few with stimulation  Type of Nipple: Everted at rest and after stimulation  Comfort (Breast/Nipple): Soft / non-tender     Hold (Positioning): Assistance needed to correctly position infant at breast and maintain latch. Intervention(s): Breastfeeding basics reviewed;Support Pillows;Position options;Skin to skin  LATCH Score: 7  Lactation Tools Discussed/Used Tools: Nipple Shields Nipple shield size: 24;20   Consult Status Consult Status: PRN Follow-up type: Call as needed    Donn Pierini 08/13/2015, 2:36 PM

## 2015-08-15 ENCOUNTER — Ambulatory Visit: Payer: Self-pay

## 2015-08-15 NOTE — Lactation Note (Signed)
This note was copied from a baby's chart. Lactation Consultation Note  Saw mom in the NICU.  She is now obtaining 50 mls each pumping.  Feeling a few firm areas outer left breast that are not resolving with heat, massage and pumping.  Instructed to soak breast in a bowl of very warm water for 10 minutes prior to pumping.  Also recommended good massage to area prior to and during pumping.  Instructed on power pumping once per day.  Mom was putting baby to breast but states her energy to feed is not as good at present so she is waiting for it to improve.  Encouraged to call for assist/concerns prn.  Patient Name: Girl Mikael SprayRaven Kimball ZOXWR'UToday's Date: 08/15/2015     Maternal Data    Feeding    LATCH Score/Interventions                      Lactation Tools Discussed/Used     Consult Status      Huston FoleyMOULDEN, Eyad Rochford S 08/15/2015, 3:00 PM

## 2015-08-17 ENCOUNTER — Ambulatory Visit: Payer: Self-pay

## 2015-08-17 NOTE — Lactation Note (Signed)
This note was copied from a baby's chart. Lactation Consultation Note  Met with mom in the NICU.  She states her supply is slowly increasing.  Baby is 55 days old and she is pumping close to 300 mls/24 hours.  Praised for her efforts.  She will let us know when she is ready to put baby back to breast.  Patient Name: Tanya [name] Today's Date: 08/17/2015     Maternal Data    Feeding    LATCH Score/Interventions                      Lactation Tools Discussed/Used     Consult Status      Ave Filter 08/17/2015, 3:26 PM

## 2015-08-18 ENCOUNTER — Inpatient Hospital Stay (HOSPITAL_COMMUNITY)
Admission: RE | Admit: 2015-08-18 | Payer: BLUE CROSS/BLUE SHIELD | Source: Ambulatory Visit | Admitting: Obstetrics and Gynecology

## 2015-08-18 SURGERY — Surgical Case
Anesthesia: Regional | Laterality: Bilateral

## 2015-08-19 ENCOUNTER — Ambulatory Visit: Payer: Self-pay

## 2015-08-19 NOTE — Lactation Note (Signed)
This note was copied from a baby's chart. Lactation Consultation Note  Patient Name: Tanya [name] WJXBJ'YToday's Date: 08/19/2015 Reason for consult: Follow-up assessment;NICU baby;Infant < 6lbs   Mom spoke with me in NICU. She is concerned that she is feeling a burning/tingling to her breasts a few times a day. She reports some mild nipple tenderness and pinkness to nipples. She reports that the feeling was not during a pumping session. Mom reports she is sanitizing her pump parts daily. Denies thrush in infant. Discussed thrush vs let down vs fullness to breast. [facility].   Infant is not BF currently and is getting close to going home. Mom reports she is taking Fenugreek and her supply has doubled.    Maternal Data Formula Feeding for Exclusion: No  Feeding Feeding Type: Breast Milk Nipple Type: Slow - flow Length of feed: 30 min  LATCH Score/Interventions                      Lactation Tools Discussed/Used     Consult Status Consult Status: PRN Follow-up type: Call as needed    Ed BlalockSharon S Lyris Hitchman 08/19/2015, 2:03 PM

## 2015-08-29 ENCOUNTER — Inpatient Hospital Stay (HOSPITAL_COMMUNITY)
Admission: AD | Admit: 2015-08-29 | Discharge: 2015-08-29 | Disposition: A | Discharge status: Home / Self Care | Payer: BLUE CROSS/BLUE SHIELD | Source: Ambulatory Visit | Attending: Obstetrics and Gynecology | Admitting: Obstetrics and Gynecology

## 2015-08-29 ENCOUNTER — Inpatient Hospital Stay (HOSPITAL_COMMUNITY): Payer: BLUE CROSS/BLUE SHIELD

## 2015-08-29 DIAGNOSIS — R110 Nausea: Secondary | ICD-10-CM | POA: Diagnosis present

## 2015-08-29 DIAGNOSIS — O99345 Other mental disorders complicating the puerperium: Secondary | ICD-10-CM | POA: Diagnosis not present

## 2015-08-29 DIAGNOSIS — F329 Major depressive disorder, single episode, unspecified: Secondary | ICD-10-CM

## 2015-08-29 DIAGNOSIS — K567 Ileus, unspecified: Secondary | ICD-10-CM

## 2015-08-29 DIAGNOSIS — F419 Anxiety disorder, unspecified: Secondary | ICD-10-CM

## 2015-08-29 DIAGNOSIS — K219 Gastro-esophageal reflux disease without esophagitis: Secondary | ICD-10-CM | POA: Diagnosis not present

## 2015-08-29 DIAGNOSIS — K589 Irritable bowel syndrome without diarrhea: Secondary | ICD-10-CM | POA: Insufficient documentation

## 2015-08-29 DIAGNOSIS — F418 Other specified anxiety disorders: Secondary | ICD-10-CM | POA: Diagnosis not present

## 2015-08-29 DIAGNOSIS — F909 Attention-deficit hyperactivity disorder, unspecified type: Secondary | ICD-10-CM | POA: Diagnosis not present

## 2015-08-29 DIAGNOSIS — F53 Puerperal psychosis: Secondary | ICD-10-CM | POA: Diagnosis not present

## 2015-08-29 DIAGNOSIS — K9189 Other postprocedural complications and disorders of digestive system: Secondary | ICD-10-CM

## 2015-08-29 DIAGNOSIS — F32A Depression, unspecified: Secondary | ICD-10-CM

## 2015-08-29 LAB — CBC
HCT: 39.5 % (ref 36.0–46.0)
Hemoglobin: 12.6 g/dL (ref 12.0–15.0)
MCH: 28.7 pg (ref 26.0–34.0)
MCHC: 31.9 g/dL (ref 30.0–36.0)
MCV: 90 fL (ref 78.0–100.0)
PLATELETS: 357 10*3/uL (ref 150–400)
RBC: 4.39 MIL/uL (ref 3.87–5.11)
RDW: 14.7 % (ref 11.5–15.5)
WBC: 6.5 10*3/uL (ref 4.0–10.5)

## 2015-08-29 LAB — URINALYSIS, ROUTINE W REFLEX MICROSCOPIC
BILIRUBIN URINE: NEGATIVE
GLUCOSE, UA: NEGATIVE mg/dL
KETONES UR: NEGATIVE mg/dL
NITRITE: NEGATIVE
PH: 6 (ref 5.0–8.0)
PROTEIN: NEGATIVE mg/dL
Specific Gravity, Urine: 1.01 (ref 1.005–1.030)

## 2015-08-29 LAB — URINE MICROSCOPIC-ADD ON

## 2015-08-29 MED ORDER — ONDANSETRON HCL 4 MG PO TABS: 4.0000 mg | ORAL_TABLET | Freq: Three times a day (TID) | ORAL | Status: DC | PRN

## 2015-08-29 MED ORDER — METOCLOPRAMIDE HCL 10 MG PO TABS: 10.0000 mg | ORAL_TABLET | Freq: Four times a day (QID) | ORAL | Status: DC | PRN

## 2015-08-29 MED ORDER — METOCLOPRAMIDE HCL 10 MG PO TABS
10.0000 mg | ORAL_TABLET | Freq: Once | ORAL | Status: AC
  Administered 2015-08-29: 10 mg via ORAL
  Filled 2015-08-29: qty 1

## 2015-08-29 MED ORDER — SERTRALINE HCL 50 MG PO TABS: 100.0000 mg | ORAL_TABLET | Freq: Every day | ORAL | Status: DC

## 2015-08-29 MED ORDER — ONDANSETRON 8 MG PO TBDP
8.0000 mg | ORAL_TABLET | Freq: Once | ORAL | Status: AC
  Administered 2015-08-29: 8 mg via ORAL
  Filled 2015-08-29: qty 1

## 2015-08-29 MED ORDER — CLONAZEPAM 0.5 MG PO TABS: 0.2500 mg | ORAL_TABLET | Freq: Two times a day (BID) | ORAL | Status: DC | PRN

## 2015-08-29 NOTE — MAU Provider Note (Signed)
History     CSN: [id number]  Arrival date and time: 08/29/15 [id number]   First Provider Initiated Contact with Patient 08/29/15 1003      Chief Complaint  Patient presents with  . Nausea   HPI   Tanya [name] is a 35 y.o. female [id number] post partum, status post primary cesarean section on 6/25 here with persistent nausea.  The nausea started on Tuesday, the nausea occurs everyday all day. She has been taking Zofran daily for the symptoms. " I haven't eaten anything since lunch time yesterday because I feel so nauseous".   Last BM was yesterday and it was normal. She did have trouble with constipation post partum and did have a visit her in MAU for severe constipation. Since that visit her bowels have been normal, however at times she has episodes of diarrhea.   Patient feeling like to needs to increase dose of Zoloft. She feels some detachment issues with the baby due to all the problems she had with the pregnancy. She is bonding with the baby and does not feel thoughts of harm, but does have episodes of worrying and sadness.   She denies abdominal pain She denies vomiting.   OB History    Gravida Para Term Preterm AB TAB SAB Ectopic Multiple Living   5 3 2 1 2  2   0 3      Past Medical History  Diagnosis Date  . No pertinent past medical history   . Tachycardia   . History of chicken pox   . Tilted uterus   . Anemia     postpartum  . IBS (irritable bowel syndrome)   . History of pyelonephritis     as child  . ADHD (attention deficit hyperactivity disorder)   . Seasonal allergies   . GERD (gastroesophageal reflux disease)   . Infection     UTI    Past Surgical History  Procedure Laterality Date  . No past surgeries    . Dilate and curettage  2016  . Cesarean section N/A 08/07/2015    Procedure: CESAREAN SECTION;  Surgeon: Marcelle OverlieMichelle Grewal, MD;  [facility];  Service: Obstetrics;  Laterality: N/A;    Family History  Problem Relation Age of Onset   . Hypertension Mother   . Hypothyroidism Mother   . Thyroid disease Mother   . Cancer Maternal Grandfather     breast  . Lupus Paternal Grandmother   . Diabetes Paternal Grandfather     Social History  Substance Use Topics  . Smoking status: Never Smoker   . Smokeless tobacco: Never Used  . Alcohol Use: No    Allergies: No Known Allergies  Prescriptions prior to admission  Medication Sig Dispense Refill Last Dose  . ibuprofen (ADVIL,MOTRIN) 600 MG tablet Take 1 tablet (600 mg total) by mouth every 6 (six) hours. 30 tablet 1 Past Week at Unknown time  . ondansetron (ZOFRAN) 4 MG tablet Take 4 mg by mouth every 8 (eight) hours as needed for nausea or vomiting.   08/28/2015 at Unknown time  . oxyCODONE-acetaminophen (PERCOCET/ROXICET) 5-325 MG tablet Take 2 tablets by mouth every 4 (four) hours as needed (pain scale > 7). 30 tablet 0 Past Week at Unknown time  . pantoprazole (PROTONIX) 40 MG tablet Take 40 mg by mouth daily.   08/28/2015 at Unknown time  . Prenatal Vit-Fe Fumarate-FA (PRENATAL MULTIVITAMIN) TABS tablet Take 1 tablet by mouth daily at 12 noon.    Past Week at Unknown  time  . sertraline (ZOLOFT) 50 MG tablet Take 50 mg by mouth daily.   08/28/2015 at Unknown time   Results for orders placed or performed during the hospital encounter of 08/29/15 (from the past 48 hour(s))  Urinalysis, Routine w reflex microscopic (not at [facility])     Status: Abnormal   Collection Time: 08/29/15  9:46 AM  Result Value Ref Range   Color, Urine YELLOW YELLOW   APPearance CLEAR CLEAR   Specific Gravity, Urine 1.010 1.005 - 1.030   pH 6.0 5.0 - 8.0   Glucose, UA NEGATIVE NEGATIVE mg/dL   Hgb urine dipstick MODERATE (A) NEGATIVE   Bilirubin Urine NEGATIVE NEGATIVE   Ketones, ur NEGATIVE NEGATIVE mg/dL   Protein, ur NEGATIVE NEGATIVE mg/dL   Nitrite NEGATIVE NEGATIVE   Leukocytes, UA SMALL (A) NEGATIVE  Urine microscopic-add on     Status: Abnormal   Collection Time: 08/29/15  9:46 AM   Result Value Ref Range   Squamous Epithelial / LPF 0-5 (A) NONE SEEN   WBC, UA 0-5 0 - 5 WBC/hpf   RBC / HPF 0-5 0 - 5 RBC/hpf   Bacteria, UA RARE (A) NONE SEEN  CBC     Status: None   Collection Time: 08/29/15 10:19 AM  Result Value Ref Range   WBC 6.5 4.0 - 10.5 K/uL   RBC 4.39 3.87 - 5.11 MIL/uL   Hemoglobin 12.6 12.0 - 15.0 g/dL   HCT 16.1 09.6 - 04.5 %   MCV 90.0 78.0 - 100.0 fL   MCH 28.7 26.0 - 34.0 pg   MCHC 31.9 30.0 - 36.0 g/dL   RDW 40.9 81.1 - 91.4 %   Platelets 357 150 - 400 K/uL   Dg Abd 2 Views  08/29/2015  CLINICAL DATA:  Nausea.  Recent Cesarean delivery. EXAM: ABDOMEN - 2 VIEW COMPARISON:  04/23/2013 CT abdomen/pelvis. FINDINGS: No disproportionately dilated small bowel loops or significant air-fluid levels. Mild gaseous distention of the colon. Mild rectal stool. No evidence of pneumatosis or pneumoperitoneum. No pathologic soft tissue calcifications. Clear lung bases. IMPRESSION: Nonobstructive bowel gas pattern. Mild gaseous distention of the colon suggests mild colonic ileus. Electronically Signed   By: Delbert Phenix M.D.   On: 08/29/2015 10:57   Review of Systems  Constitutional: Positive for chills. Negative for fever.  Gastrointestinal: Positive for nausea. Negative for vomiting and abdominal pain.  Psychiatric/Behavioral: Positive for depression. Negative for suicidal ideas, hallucinations, memory loss and substance abuse. The patient is nervous/anxious. The patient does not have insomnia.    Physical Exam   Blood pressure 126/66, pulse 64, temperature 98.3 F (36.8 C), temperature source Oral, resp. rate 16, SpO2 100 %, currently breastfeeding.  Physical Exam  Constitutional: She is oriented to person, place, and time. She appears well-developed and well-nourished.  Non-toxic appearance. She does not have a sickly appearance. She does not appear ill. No distress.  HENT:  Head: Normocephalic.  Eyes: Pupils are equal, round, and reactive to light.  GI:  Soft. Normal appearance. Bowel sounds are increased. There is no tenderness. There is no rigidity, no rebound and no guarding.  Musculoskeletal: Normal range of motion.  Neurological: She is alert and oriented to person, place, and time.  Skin: Skin is warm. She is not diaphoretic. There is pallor.  Psychiatric: Her behavior is normal.    MAU Course  Procedures  None  MDM  CBC Xray of abdomen  Discussed patient with Dr. Marcelle Overlie reviewed abdominal xray with Dr. Marcelle Overlie.  Reglan  and Zofran given prior to Dc home.    Assessment and Plan   A:  1. Depression   2. Nausea   3. Anxiousness   4. Ileus, postoperative      P:  Discharge home in stable condition Return precautions discussed in detail Call the office and schedule a follow up appointment for this visit.  Rx: Klonopin, Increase zoloft, reglan, zofran.  Use Zofran sparingly  Discussed normalcy of periods of sadness vs. Post partum depression.    Duane Lope, NP 08/29/2015 1:25 PM

## 2015-08-29 NOTE — Discharge Instructions (Signed)
Ileus ° Ileus is a condition in which the intestines, also called the bowels, stop working and moving correctly. If the intestines stop working, food cannot pass through to get digested. The intestines are hollow organs that digest food after the food leaves the stomach. These organs are long, muscular tubes that connect the stomach to the rectum. When ileus occurs, the muscular contractions that cause food to move through the intestines stop happening as they normally would. °Ileus can occur for various reasons. This condition is a serious problem that usually requires hospitalization. It can cause symptoms such as nausea, abdominal pain, and bloating. Ileus can last from a few hours to a few days. If the intestines stop working because of a blockage, that is a different condition that is called a bowel obstruction. °CAUSES °This condition may be caused by: °· Surgery on the abdomen. °· An infection or inflammation in the abdomen. This includes inflammation of the lining of the abdomen (peritonitis). °· Infection or inflammation in other parts of the body, such as pneumonia or pancreatitis. °· Passage of gallstones or kidney stones. °· Damage to the nerves or blood vessels that go to the intestines. °· A collection of blood within the abdominal cavity. °· Imbalance in the salts in the blood (electrolytes). °· Injury to the brain or spinal cord. °· Medicines. Many medicines, including strong pain medicines, can cause ileus or make it worse. °SYMPTOMS °Symptoms of this condition include: °· Bloating of the abdomen. °· Pain or discomfort in the abdomen. °· Poor appetite. °· Nausea and vomiting. °· Lack of normal bowel sounds, such as "growling" in the stomach. °DIAGNOSIS °This condition may be diagnosed with: °· A physical exam and medical history. °· X-rays or a CT scan of the abdomen. °You may also have other tests to help find the cause of the condition. °TREATMENT °Treatment for this condition may  include: °· Resting the intestines until they start to work again. This is often done by: °¨ Stopping oral intake of food and drink. You will be given fluid through an IV tube to prevent dehydration. °¨ Placing a small tube (nasogastric tube or NG tube) that is passed through your nose and into your stomach. The tube is attached to a suction device and keeps the stomach emptied out. This allows the bowels to rest and also helps to reduce nausea and vomiting. °· Correcting any electrolyte imbalance by giving supplements in the IV fluid. °· Stopping any medicines that might make ileus worse. °· Treating any condition that may have caused ileus. °HOME CARE INSTRUCTIONS °· Follow instructions from your health care provider about diet and fluid intake. Usually, you will be told to: °¨ Drink plenty of clear fluids. °¨ Avoid alcohol. °¨ Avoid caffeine. °¨ Eat a bland diet. °· Get plenty of rest. Return to your normal activities as told by your health care provider. °· Take over-the-counter and prescription medicines only as told by your health care provider. °· Keep all follow-up visits as told by your health care provider. This is important. °SEEK MEDICAL CARE IF: °· You have nausea, vomiting, or abdominal discomfort. °· You have a fever. °SEEK IMMEDIATE MEDICAL CARE IF: °· You have severe abdominal pain or bloating. °· You cannot eat or drink without vomiting. °  °This information is not intended to replace advice given to you by your health care provider. Make sure you discuss any questions you have with your health care provider. °  °Document Released: 02/01/2003 Document Revised: 10/20/2014 Document   Reviewed: 03/25/2014 °[employer]vier Interactive Patient Education ©2016 [employer] ° °

## 2015-08-29 NOTE — MAU Note (Signed)
C/S 6/25 due to previa. States was doing fine when she went home. Since Tuesday she states she was nauseated off and on, but constant since Thursday (4 days). States has not been vomiting, just severe nausea and lack of appetite. States she feels weak and feels hot and cold at times. States baby was in NICU, but home now. Was breastfeeding, but has discontinued. Denies any pain or redness in breasts.

## 2016-11-12 LAB — HM PAP SMEAR: HM PAP: NEGATIVE

## 2016-12-28 DIAGNOSIS — R399 Unspecified symptoms and signs involving the genitourinary system: Secondary | ICD-10-CM | POA: Diagnosis not present

## 2016-12-29 ENCOUNTER — Encounter: Payer: Self-pay | Admitting: Emergency Medicine

## 2016-12-29 ENCOUNTER — Ambulatory Visit
Admission: EM | Admit: 2016-12-29 | Discharge: 2016-12-29 | Disposition: A | Discharge status: Home / Self Care | Payer: Managed Care, Other (non HMO) | Attending: Emergency Medicine | Admitting: Emergency Medicine

## 2016-12-29 DIAGNOSIS — R350 Frequency of micturition: Secondary | ICD-10-CM | POA: Diagnosis not present

## 2016-12-29 DIAGNOSIS — N390 Urinary tract infection, site not specified: Secondary | ICD-10-CM | POA: Diagnosis not present

## 2016-12-29 DIAGNOSIS — R300 Dysuria: Secondary | ICD-10-CM

## 2016-12-29 LAB — URINALYSIS, COMPLETE (UACMP) WITH MICROSCOPIC

## 2016-12-29 MED ORDER — FLUCONAZOLE 150 MG PO TABS: ORAL_TABLET | ORAL | 0 refills | Status: DC

## 2016-12-29 MED ORDER — CEPHALEXIN 500 MG PO CAPS: 500.0000 mg | ORAL_CAPSULE | Freq: Two times a day (BID) | ORAL | 0 refills | Status: DC

## 2016-12-29 NOTE — ED Triage Notes (Signed)
Seen at [facility] on Monday and was started on Bactrim. Still having frequent urination, burning and pain.

## 2016-12-29 NOTE — ED Provider Notes (Signed)
[facility]    CSN: [id number] Arrival date & time: 12/29/16  0910     History   Chief Complaint Chief Complaint  Patient presents with  . Urinary Frequency    HPI Tanya [name] is a 36 y.o. female.   HPI   This a 36 year old female who presents with frequency dysuria urgency. [place] was initially see in [place]. Those records were reviewed in detail. At that time a urinalysis dipstick was performed as well as a urine culture that grew mixed flora. She had been started on Bactrim but has continued to have her symptoms. She then contacted her urologist by phone and he ordered another urine culture obtained but results will not be available until Monday.  States That she did not want to go through the weekend with her symptoms not improving. She has a history of renal reflux since childhood and has had rather frequent recurrent kidney infections. She had one episode of pyelonephritis in the past that required hospitalization. Present she denies any nausea vomiting. She has had fever up to 100 at one point but has not had any recently. She has had a vaginal discharge that is unusual for her. She has an appointment with her OB on Monday and will have that evaluated at that point time.      Past Medical History:  Diagnosis Date  . ADHD (attention deficit hyperactivity disorder)   . Anemia    postpartum  . GERD (gastroesophageal reflux disease)   . History of chicken pox   . History of pyelonephritis    as child  . IBS (irritable bowel syndrome)   . Infection    UTI  . No pertinent past medical history   . Seasonal allergies   . Tachycardia   . Tilted uterus     Patient Active Problem List   Diagnosis Date Noted  . S/P cesarean section 08/07/2015  . Placenta previa antepartum 08/03/2015  . TACHYCARDIA 06/07/2008  . DYSPNEA 06/07/2008    Past Surgical History:  Procedure Laterality Date  . CESAREAN SECTION    . CESAREAN SECTION N/A 08/07/2015   Performed by Marcelle OverlieGrewal, Michelle, MD at [facility]  . dilate and curettage  2016  . NO PAST SURGERIES    . Tubiligation      OB History    Gravida Para Term Preterm AB Living   5 3 2 1 2 3    SAB TAB Ectopic Multiple Live Births   2     0 3       Home Medications    Prior to Admission medications   Medication Sig Start Date End Date Taking? Authorizing Provider  diazepam (VALIUM) 5 MG tablet Take 5 mg every 6 (six) hours as needed by mouth for anxiety.   Yes [provider]  ibuprofen (ADVIL,MOTRIN) 600 MG tablet Take 1 tablet (600 mg total) by mouth every 6 (six) hours. 08/11/15  Yes Julio Sicksurtis, Carol, NP  sulfamethoxazole-trimethoprim (BACTRIM,SEPTRA) 400-80 MG tablet Take 1 tablet 2 (two) times daily by mouth.   Yes [provider]  zolpidem (AMBIEN) 5 MG tablet Take 5 mg at bedtime as needed by mouth for sleep.   Yes [provider]  cephALEXin (KEFLEX) 500 MG capsule Take 1 capsule (500 mg total) 2 (two) times daily by mouth. 12/29/16   Lutricia Feiloemer, William P, PA-C  clonazePAM (KLONOPIN) 0.5 MG tablet Take 0.5-1 tablets (0.25-0.5 mg total) by mouth 2 (two) times daily as needed for anxiety. 08/29/15  Rasch, Harolyn Rutherford, NP  fluconazole (DIFLUCAN) 150 MG tablet Take one tab for symptoms of yeast infection. Repeat x 1 in 72 hours. 12/29/16   Lutricia Feil, PA-C  metoCLOPramide (REGLAN) 10 MG tablet Take 1 tablet (10 mg total) by mouth every 6 (six) hours as needed for nausea or vomiting. 08/29/15   Rasch, Harolyn Rutherford, NP  ondansetron (ZOFRAN) 4 MG tablet Take 1 tablet (4 mg total) by mouth every 8 (eight) hours as needed for nausea or vomiting. 08/29/15   Rasch, Victorino Dike I, NP  pantoprazole (PROTONIX) 40 MG tablet Take 40 mg by mouth daily.    [provider]  Prenatal Vit-Fe Fumarate-FA (PRENATAL MULTIVITAMIN) TABS tablet Take 1 tablet by mouth daily at 12 noon.     [provider]  sertraline (ZOLOFT) 50 MG tablet Take 2 tablets (100 mg total)  by mouth daily. 08/29/15   Rasch, Harolyn Rutherford, NP    Family History Family History  Problem Relation Age of Onset  . Hypertension Mother   . Hypothyroidism Mother   . Thyroid disease Mother   . Cancer Maternal Grandfather        breast  . Lupus Paternal Grandmother   . Diabetes Paternal Grandfather     Social History Social History   Tobacco Use  . Smoking status: Never Smoker  . Smokeless tobacco: Never Used  Substance Use Topics  . Alcohol use: No  . Drug use: No     Allergies   Patient has no known allergies.   Review of Systems Review of Systems  Constitutional: Positive for activity change and fever. Negative for appetite change, chills and fatigue.  Gastrointestinal: Negative for abdominal pain, blood in stool, constipation, diarrhea, nausea and vomiting.  Genitourinary: Positive for dysuria, frequency, urgency and vaginal discharge.  All other systems reviewed and are negative.    Physical Exam Triage Vital Signs ED Triage Vitals  Enc Vitals Group     BP 12/29/16 0954 123/75     Pulse Rate 12/29/16 0954 (!) 112     Resp 12/29/16 0954 18     Temp 12/29/16 0954 99 F (37.2 C)     Temp Source 12/29/16 0954 Oral     SpO2 12/29/16 0954 99 %     Weight 12/29/16 0955 185 lb (83.9 kg)     Height 12/29/16 0955 5\' 7"  (1.702 m)     Head Circumference --      Peak Flow --      Pain Score 12/29/16 0956 6     Pain Loc --      Pain Edu? --      Excl. in GC? --    No data found.  Updated Vital Signs BP 123/75 (BP Location: Left Arm)   Pulse (!) 112   Temp 99 F (37.2 C) (Oral)   Resp 18   Ht 5\' 7"  (1.702 m)   Wt 185 lb (83.9 kg)   LMP 12/14/2016   SpO2 99%   BMI 28.98 kg/m   Visual Acuity Right Eye Distance:   Left Eye Distance:   Bilateral Distance:    Right Eye Near:   Left Eye Near:    Bilateral Near:     Physical Exam  Constitutional: She is oriented to person, place, and time. She appears well-developed and well-nourished. No distress.    HENT:  Head: Normocephalic.  Eyes: Pupils are equal, round, and reactive to light.  Neck: Normal range of motion.  Pulmonary/Chest: Effort normal and  breath sounds normal.  Abdominal: Soft. Bowel sounds are normal. She exhibits no distension and no mass. There is tenderness. There is no rebound and no guarding.  The patient has diffuse tenderness in the pelvic/suprapubic area. There is no rebound no guarding present. Is no CVA tenderness present  Genitourinary:  Genitourinary Comments: Deferred to her OB on Monday if applicable  Musculoskeletal: Normal range of motion.  Neurological: She is alert and oriented to person, place, and time.  Skin: Skin is warm and dry. She is not diaphoretic.  Psychiatric: She has a normal mood and affect. Her behavior is normal. Judgment and thought content normal.  Nursing note and vitals reviewed.    UC Treatments / Results  Labs (all labs ordered are listed, but only abnormal results are displayed) Labs Reviewed  URINALYSIS, COMPLETE (UACMP) WITH MICROSCOPIC - Abnormal; Notable for the following components:      Result Value   Color, Urine ORANGE (*)    APPearance HAZY (*)    Glucose, UA   (*)    Value: TEST NOT REPORTED DUE TO COLOR INTERFERENCE OF URINE PIGMENT   Hgb urine dipstick   (*)    Value: TEST NOT REPORTED DUE TO COLOR INTERFERENCE OF URINE PIGMENT   Bilirubin Urine   (*)    Value: TEST NOT REPORTED DUE TO COLOR INTERFERENCE OF URINE PIGMENT   Ketones, ur   (*)    Value: TEST NOT REPORTED DUE TO COLOR INTERFERENCE OF URINE PIGMENT   Protein, ur   (*)    Value: TEST NOT REPORTED DUE TO COLOR INTERFERENCE OF URINE PIGMENT   Nitrite   (*)    Value: TEST NOT REPORTED DUE TO COLOR INTERFERENCE OF URINE PIGMENT   Leukocytes, UA   (*)    Value: TEST NOT REPORTED DUE TO COLOR INTERFERENCE OF URINE PIGMENT   Squamous Epithelial / LPF 0-5 (*)    Bacteria, UA FEW (*)    All other components within normal limits    EKG  EKG  Interpretation None       Radiology No results found.  Procedures Procedures (including critical care time)  Medications Ordered in UC Medications - No data to display   Initial Impression / Assessment and Plan / UC Course  I have reviewed the triage vital signs and the nursing notes.  Pertinent labs & imaging results that were available during my care of the patient were reviewed by me and considered in my medical decision making (see chart for details).     Plan: 1. Test/x-ray results and diagnosis reviewed with patient 2. rx as per orders; risks, benefits, potential side effects reviewed with patient 3. Recommend supportive treatment with increased hydration. If develops high fever ,nausea, vomiting, back pain  go immediately to the emergency room. Recommend following up with OB on Monday. 4. F/u prn if symptoms worsen or don't improve   Final Clinical Impressions(s) / UC Diagnoses   Final diagnoses:  Dysuria  Lower urinary tract infectious disease    ED Discharge Orders        Ordered    cephALEXin (KEFLEX) 500 MG capsule  2 times daily     12/29/16 1036    fluconazole (DIFLUCAN) 150 MG tablet     12/29/16 1036       Controlled Substance Prescriptions [place] Controlled Substance Registry consulted? Not Applicable   Lutricia FeilRoemer, William P, PA-C 12/29/16 1053

## 2017-01-31 ENCOUNTER — Encounter: Payer: Self-pay | Admitting: *Deleted

## 2017-02-01 ENCOUNTER — Encounter: Payer: Self-pay | Admitting: Diagnostic Neuroimaging

## 2017-02-01 ENCOUNTER — Ambulatory Visit (INDEPENDENT_AMBULATORY_CARE_PROVIDER_SITE_OTHER): Payer: Managed Care, Other (non HMO) | Admitting: Diagnostic Neuroimaging

## 2017-02-01 VITALS — BP 112/69 | HR 72 | Ht 67.0 in | Wt 188.0 lb

## 2017-02-01 DIAGNOSIS — G43109 Migraine with aura, not intractable, without status migrainosus: Secondary | ICD-10-CM | POA: Diagnosis not present

## 2017-02-01 HISTORY — DX: Migraine with aura, not intractable, without status migrainosus: G43.109

## 2017-02-01 NOTE — Progress Notes (Signed)
[facility]  PATIENT: Tanya [name] DOB: 12-19-1980  REFERRING CLINICIAN: Rana Snare HISTORY FROM: patient  REASON FOR VISIT: new consult    HISTORICAL  CHIEF COMPLAINT:  Chief Complaint  Patient presents with  . NP Dr. Candice Camp  . Migraines    Has rare migraines, ? 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  Has taken only imitrex and aleve.  Has seen Dr. Elmer Picker (opthamologist)    HISTORY OF PRESENT ILLNESS:   36 year old female here for evaluation of headaches.  In her 60s patient had onset of headaches, right side of her head, with sharp throbbing pain, photophobia, phonophobia and nausea.  These will be preceded by left eye visual disturbance, squiggly lines, inability to see and streaming visual disturbances.  At the visual disturbances of occurred 3 times in her life, first in 2011.  She has had other episodes of headaches on the right side with similar symptoms 4-5 times per year.  No significant dizziness or headache.  Triggering factors in the past have included hormone changes and birth control pill.  Patient has family history of migraine in her mother.  Patient has been prescribed sumatriptan which seems to help.  She has seen eye doctor 2 years ago but not recently.   REVIEW OF SYSTEMS: Full 14 system review of systems performed and negative with exception of: Ringing in ears blurred vision easy bruising headache numbness weakness dizziness tremor anxiety joint pain decreased energy urination problems.  ALLERGIES: No Known Allergies  HOME MEDICATIONS: Outpatient Medications Prior to Visit  Medication Sig Dispense Refill  . diazepam (VALIUM) 5 MG tablet Take 5 mg every 6 (six) hours as needed by mouth for anxiety.    Marland Kitchen ibuprofen (ADVIL,MOTRIN) 600 MG tablet Take 1 tablet (600 mg total) by mouth every 6 (six) hours. 30 tablet 1  . pantoprazole (PROTONIX) 40 MG tablet Take 40 mg by mouth daily.    . SUMAtriptan (IMITREX) 50 MG tablet Take  1 tablet by mouth as needed.  0  . zolpidem (AMBIEN) 5 MG tablet Take 5 mg at bedtime as needed by mouth for sleep.    Marland Kitchen ondansetron (ZOFRAN) 4 MG tablet Take 1 tablet (4 mg total) by mouth every 8 (eight) hours as needed for nausea or vomiting. (Patient not taking: Reported on 02/01/2017) 20 tablet 0  . cephALEXin (KEFLEX) 500 MG capsule Take 1 capsule (500 mg total) 2 (two) times daily by mouth. 10 capsule 0  . clonazePAM (KLONOPIN) 0.5 MG tablet Take 0.5-1 tablets (0.25-0.5 mg total) by mouth 2 (two) times daily as needed for anxiety. 15 tablet 0  . fluconazole (DIFLUCAN) 150 MG tablet Take one tab for symptoms of yeast infection. Repeat x 1 in 72 hours. 2 tablet 0  . metoCLOPramide (REGLAN) 10 MG tablet Take 1 tablet (10 mg total) by mouth every 6 (six) hours as needed for nausea or vomiting. 30 tablet 0  . Prenatal Vit-Fe Fumarate-FA (PRENATAL MULTIVITAMIN) TABS tablet Take 1 tablet by mouth daily at 12 noon.     . sertraline (ZOLOFT) 50 MG tablet Take 2 tablets (100 mg total) by mouth daily. 30 tablet 1  . sulfamethoxazole-trimethoprim (BACTRIM,SEPTRA) 400-80 MG tablet Take 1 tablet 2 (two) times daily by mouth.     No facility-administered medications prior to visit.     PAST MEDICAL HISTORY: Past Medical History:  Diagnosis Date  . ADHD (attention deficit hyperactivity disorder)   . Anemia    postpartum  .  Anxiety   . GERD (gastroesophageal reflux disease)   . History of chicken pox   . History of pyelonephritis    as child  . IBS (irritable bowel syndrome)   . Infection    UTI  . Interstitial cystitis   . Migraines   . No pertinent past medical history   . Seasonal allergies   . Tachycardia   . Tilted uterus     PAST SURGICAL HISTORY: Past Surgical History:  Procedure Laterality Date  . CESAREAN SECTION N/A 08/07/2015   Procedure: CESAREAN SECTION;  Surgeon: Marcelle OverlieMichelle Grewal, MD;  [facility];  Service: Obstetrics;  Laterality: N/A;  . CESAREAN SECTION     . dilate and curettage  2016  . Tubiligation      FAMILY HISTORY: Family History  Problem Relation Age of Onset  . Hypertension Mother   . Hypothyroidism Mother   . Thyroid disease Mother   . Other Mother        benign brain tumor  . Cancer Maternal Grandfather        breast  . Lupus Paternal Grandmother   . Diabetes Paternal Grandfather     SOCIAL HISTORY:  Social History   Socioeconomic History  . Marital status: Married    Spouse name: Not on file  . Number of children: Not on file  . Years of education: Not on file  . Highest education level: Not on file  Social Needs  . Financial resource strain: Not on file  . Food insecurity - worry: Not on file  . Food insecurity - inability: Not on file  . Transportation needs - medical: Not on file  . Transportation needs - non-medical: Not on file  Occupational History  . Not on file  Tobacco Use  . Smoking status: Never Smoker  . Smokeless tobacco: Never Used  Substance and Sexual Activity  . Alcohol use: Yes    Comment: 1-2 per week  . Drug use: No  . Sexual activity: Yes  Other Topics Concern  . Not on file  Social History Narrative   Lives at home with spouse, August SaucerDean, and 3 kids.  Works at [employer].  Education: college.  Caffeine 1-2 per day.      PHYSICAL EXAM  GENERAL EXAM/CONSTITUTIONAL: Vitals:  Vitals:   02/01/17 0845  BP: 112/69  Pulse: 72  Weight: 188 lb (85.3 kg)  Height: 5\' 7"  (1.702 m)     Body mass index is 29.44 kg/m.  Visual Acuity Screening   Right eye Left eye Both eyes  Without correction: 20/100 20/30   With correction:        Patient is in no distress; well developed, nourished and groomed; neck is supple  CARDIOVASCULAR:  Examination of carotid arteries is normal; no carotid bruits  Regular rate and rhythm, no murmurs  Examination of peripheral vascular system by observation and palpation is normal  EYES:  Ophthalmoscopic exam of optic discs and  posterior segments is normal; no papilledema or hemorrhages  MUSCULOSKELETAL:  Gait, strength, tone, movements noted in Neurologic exam below  NEUROLOGIC: MENTAL STATUS:  No flowsheet data found.  awake, alert, oriented to person, place and time  recent and remote memory intact  normal attention and concentration  language fluent, comprehension intact, naming intact,   fund of knowledge appropriate  CRANIAL NERVE:   2nd - no papilledema on fundoscopic exam  2nd, 3rd, 4th, 6th - pupils equal and reactive to light, visual fields full to confrontation,  extraocular muscles intact, no nystagmus  5th - facial sensation symmetric  7th - facial strength symmetric  8th - hearing intact  9th - palate elevates symmetrically, uvula midline  11th - shoulder shrug symmetric  12th - tongue protrusion midline  MOTOR:   normal bulk and tone, full strength in the BUE, BLE  SENSORY:   normal and symmetric to light touch, temperature, vibration  COORDINATION:   finger-nose-finger, fine finger movements normal  REFLEXES:   deep tendon reflexes present and symmetric; EXCEPT SLIGHT DECR IN LEFT ANKLE; DOWN GOING TOES  GAIT/STATION:   narrow based gait; able to walk on toes, heels and tandem; romberg is negative    DIAGNOSTIC DATA (LABS, IMAGING, TESTING) - I reviewed patient records, labs, notes, testing and imaging myself where available.  Lab Results  Component Value Date   WBC 6.5 08/29/2015   HGB 12.6 08/29/2015   HCT 39.5 08/29/2015   MCV 90.0 08/29/2015   PLT 357 08/29/2015      Component Value Date/Time   NA 137 04/23/2013 0916   K 4.4 04/23/2013 0916   CL 104 04/23/2013 0916   CO2 27 04/23/2013 0916   GLUCOSE 78 04/23/2013 0916   BUN 16 04/23/2013 0916   CREATININE 0.78 04/23/2013 0916   CALCIUM 9.2 04/23/2013 0916   PROT 7.1 04/23/2013 0916   ALBUMIN 4.5 04/23/2013 0916   AST 14 04/23/2013 0916   ALT 12 04/23/2013 0916   ALKPHOS 54 04/23/2013  0916   BILITOT 0.4 04/23/2013 0916   No results found for: CHOL, HDL, LDLCALC, LDLDIRECT, TRIG, CHOLHDL No results found for: [id number] No results found for: VITAMINB12 Lab Results  Component Value Date   TSH 1.45 06/08/2008         ASSESSMENT AND PLAN  36 y.o. year old female here with typical migraine with aura since age 36 years old. Neuro exam unremarkable. Patient doing well on sumatriptan as needed.  Headaches occur 4-5 times per year and therefore we will hold off on migraine prevention medication for now.   Dx:  1. Migraine with aura and without status migrainosus, not intractable     PLAN: - continue sumatriptan as needed - monitor migraine headaches and triggers  Return in about 6 months (around 08/02/2017).    Suanne MarkerVIKRAM R. Micheil Klaus, MD 02/01/2017, 9:20 AM Certified in Neurology, Neurophysiology and Neuroimaging  [facility] [address], [address] [place], [place] [address] [phone]

## 2017-02-01 NOTE — Patient Instructions (Signed)
Thank you for coming to see [place] at [facility]. I hope we have been able to provide you high quality care today.  You may receive a patient satisfaction survey over the next few weeks. We would appreciate your feedback and comments so that we may continue to improve ourselves and the health of our patients.  To prevent or relieve headaches, try the following:   Cool Compress. Lie down and place a cool compress on your head.   Avoid headache triggers. If certain foods or odors seem to have triggered your migraines in the past, avoid them. A headache diary might help you identify triggers.   Include physical activity in your daily routine.   Manage stress. Find healthy ways to cope with the stressors, such as delegating tasks on your to-do list.   Practice relaxation techniques. Try deep breathing, yoga, massage and visualization.   Eat regularly. Eating regularly scheduled meals and maintaining a healthy diet might help prevent headaches. Also, drink plenty of fluids.   Follow a regular sleep schedule. Sleep deprivation might contribute to headaches  Consider biofeedback. With this mind-body technique, you learn to control certain bodily functions - such as muscle tension, heart rate and blood pressure - to prevent headaches or reduce headache pain.   ~~~~~~~~~~~~~~~~~~~~~~~~~~~~~~~~~~~~~~~~~~~~~~~~~~~~~~~~~~~~~~~~~  DR. Bassam Dresch'S GUIDE TO HAPPY AND HEALTHY LIVING These are some of my general health and wellness recommendations. Some of them may apply to you better than others. Please use common sense as you try these suggestions and feel free to ask me any questions.   ACTIVITY/FITNESS Mental, social, emotional and physical stimulation are very important for brain and body health. Try learning a new activity (arts, music, language, sports, games).  Keep moving your body to the best of your abilities. You can do this at home, inside or outside, the park,  community center, gym or anywhere you like. Consider a physical therapist or personal trainer to get started. Consider the app Sworkit. Fitness trackers such as smart-watches, smart-phones or Fitbits can help as well.   NUTRITION Eat more plants: colorful vegetables, nuts, seeds and berries.  Eat less sugar, salt, preservatives and processed foods.  Avoid toxins such as cigarettes and alcohol.  Drink water when you are thirsty. Warm water with a slice of lemon is an excellent morning drink to start the day.  Consider these websites for more information The Nutrition Source (https://www.henry-hernandez.biz/) Precision Nutrition (WindowBlog.ch)   RELAXATION Consider practicing mindfulness meditation or other relaxation techniques such as deep breathing, prayer, yoga, tai chi, massage. See website mindful.org or the apps Headspace or Calm to help get started.   SLEEP Try to get at least 7-8+ hours sleep per day. Regular exercise and reduced caffeine will help you sleep better. Practice good sleep hygeine techniques. See website sleep.org for more information.   PLANNING Prepare estate planning, living will, healthcare POA documents. Sometimes this is best planned with the help of an attorney. Theconversationproject.org and agingwithdignity.org are excellent resources.

## 2017-02-21 DIAGNOSIS — Z6829 Body mass index (BMI) 29.0-29.9, adult: Secondary | ICD-10-CM | POA: Diagnosis not present

## 2017-02-21 DIAGNOSIS — B349 Viral infection, unspecified: Secondary | ICD-10-CM | POA: Diagnosis not present

## 2017-02-21 DIAGNOSIS — F900 Attention-deficit hyperactivity disorder, predominantly inattentive type: Secondary | ICD-10-CM | POA: Insufficient documentation

## 2017-03-24 DIAGNOSIS — R05 Cough: Secondary | ICD-10-CM | POA: Diagnosis not present

## 2017-03-24 DIAGNOSIS — R0981 Nasal congestion: Secondary | ICD-10-CM | POA: Diagnosis not present

## 2017-03-24 DIAGNOSIS — J101 Influenza due to other identified influenza virus with other respiratory manifestations: Secondary | ICD-10-CM | POA: Diagnosis not present

## 2017-03-24 DIAGNOSIS — R6883 Chills (without fever): Secondary | ICD-10-CM | POA: Diagnosis not present

## 2017-06-17 DIAGNOSIS — R51 Headache: Secondary | ICD-10-CM | POA: Diagnosis not present

## 2017-06-17 DIAGNOSIS — N939 Abnormal uterine and vaginal bleeding, unspecified: Secondary | ICD-10-CM | POA: Diagnosis not present

## 2017-06-17 DIAGNOSIS — N390 Urinary tract infection, site not specified: Secondary | ICD-10-CM | POA: Diagnosis not present

## 2017-06-17 DIAGNOSIS — R635 Abnormal weight gain: Secondary | ICD-10-CM | POA: Diagnosis not present

## 2017-06-17 DIAGNOSIS — R5383 Other fatigue: Secondary | ICD-10-CM | POA: Diagnosis not present

## 2017-06-17 DIAGNOSIS — N926 Irregular menstruation, unspecified: Secondary | ICD-10-CM | POA: Diagnosis not present

## 2017-06-17 DIAGNOSIS — N951 Menopausal and female climacteric states: Secondary | ICD-10-CM | POA: Diagnosis not present

## 2017-06-18 LAB — TSH: TSH: 2.23

## 2017-08-05 ENCOUNTER — Ambulatory Visit: Payer: Managed Care, Other (non HMO) | Admitting: Diagnostic Neuroimaging

## 2017-08-24 DIAGNOSIS — J00 Acute nasopharyngitis [common cold]: Secondary | ICD-10-CM | POA: Diagnosis not present

## 2017-08-24 DIAGNOSIS — H1032 Unspecified acute conjunctivitis, left eye: Secondary | ICD-10-CM | POA: Diagnosis not present

## 2017-08-24 DIAGNOSIS — Z6830 Body mass index (BMI) 30.0-30.9, adult: Secondary | ICD-10-CM | POA: Diagnosis not present

## 2017-08-25 DIAGNOSIS — H109 Unspecified conjunctivitis: Secondary | ICD-10-CM | POA: Diagnosis not present

## 2017-08-25 DIAGNOSIS — J069 Acute upper respiratory infection, unspecified: Secondary | ICD-10-CM | POA: Diagnosis not present

## 2017-08-29 DIAGNOSIS — H1012 Acute atopic conjunctivitis, left eye: Secondary | ICD-10-CM | POA: Diagnosis not present

## 2017-09-03 ENCOUNTER — Encounter: Payer: Self-pay | Admitting: Diagnostic Neuroimaging

## 2017-09-03 ENCOUNTER — Ambulatory Visit: Payer: BLUE CROSS/BLUE SHIELD | Admitting: Diagnostic Neuroimaging

## 2017-09-03 VITALS — BP 111/70 | HR 75 | Ht 67.0 in | Wt 195.2 lb

## 2017-09-03 DIAGNOSIS — G43109 Migraine with aura, not intractable, without status migrainosus: Secondary | ICD-10-CM | POA: Diagnosis not present

## 2017-09-03 MED ORDER — FROVATRIPTAN SUCCINATE 2.5 MG PO TABS: 2.5000 mg | ORAL_TABLET | ORAL | 6 refills | Status: DC | PRN

## 2017-09-03 NOTE — Patient Instructions (Signed)
MIGRAINE WITH AURA (worsening) - premedicate with ibuprofen and tylenol (5-7 days before) leading up to menstrual cycle - change sumatriptan to frovatriptan as needed

## 2017-09-03 NOTE — Progress Notes (Signed)
[facility]  PATIENT: Tanya [name] DOB: 1980/09/10  REFERRING CLINICIAN: Rana Snare HISTORY FROM: patient  REASON FOR VISIT: follow up   HISTORICAL  CHIEF COMPLAINT:  Chief Complaint  Patient presents with  . Follow-up  . Migraine    monthly migraines using sumatriptan prn, last month took sumatriptan 3-4 times (had rebound).  Try something diifferent?    HISTORY OF PRESENT ILLNESS:   UPDATE (09/03/17, VRP): Since last visit, doing worse with HA. Symptoms are 3-4 per month, leading up to menstrual cycle. Severity is moderate. No other alleviating or aggravating factors. Tolerating meds, but sumatriptan not that effective.    PRIOR HPI (02/01/17, VRP): 37 year old female here for evaluation of headaches.  In her 10s patient had onset of headaches, right side of her head, with sharp throbbing pain, photophobia, phonophobia and nausea.  These will be preceded by left eye visual disturbance, squiggly lines, inability to see and streaming visual disturbances.  At the visual disturbances of occurred 3 times in her life, first in 2011.  She has had other episodes of headaches on the right side with similar symptoms 4-5 times per year.  No significant dizziness or headache.  Triggering factors in the past have included hormone changes and birth control pill.  Patient has family history of migraine in her mother.  Patient has been prescribed sumatriptan which seems to help.  She has seen eye doctor 2 years ago but not recently.   REVIEW OF SYSTEMS: Full 14 system review of systems performed and negative with exception of: numbness neck pain ringing in ears.    ALLERGIES: No Known Allergies  HOME MEDICATIONS: Outpatient Medications Prior to Visit  Medication Sig Dispense Refill  . diazepam (VALIUM) 5 MG tablet Take 5 mg every 6 (six) hours as needed by mouth for anxiety.    Marland Kitchen ibuprofen (ADVIL,MOTRIN) 600 MG tablet Take 1 tablet (600 mg total) by mouth every 6 (six)  hours. 30 tablet 1  . SUMAtriptan (IMITREX) 50 MG tablet Take 1 tablet by mouth as needed.  0  . zolpidem (AMBIEN) 5 MG tablet Take 5 mg at bedtime as needed by mouth for sleep.    Marland Kitchen ondansetron (ZOFRAN) 4 MG tablet Take 1 tablet (4 mg total) by mouth every 8 (eight) hours as needed for nausea or vomiting. (Patient not taking: Reported on 09/03/2017) 20 tablet 0  . pantoprazole (PROTONIX) 40 MG tablet Take 40 mg by mouth daily.     No facility-administered medications prior to visit.     PAST MEDICAL HISTORY: Past Medical History:  Diagnosis Date  . ADHD (attention deficit hyperactivity disorder)   . Anemia    postpartum  . Anxiety   . GERD (gastroesophageal reflux disease)   . History of chicken pox   . History of pyelonephritis    as child  . IBS (irritable bowel syndrome)   . Infection    UTI  . Interstitial cystitis   . Migraines   . No pertinent past medical history   . Seasonal allergies   . Tachycardia   . Tilted uterus     PAST SURGICAL HISTORY: Past Surgical History:  Procedure Laterality Date  . CESAREAN SECTION N/A 08/07/2015   Procedure: CESAREAN SECTION;  Surgeon: Marcelle Overlie, MD;  Location: [facility]IRTHING SUIT[facility];  Service: Obstetrics;  Laterality: N/A;  . CESAREAN SECTION    . dilate and curettage  2016  . Tubiligation      FAMILY HISTORY: Family History  Problem Relation  Age of Onset  . Hypertension Mother   . Hypothyroidism Mother   . Thyroid disease Mother   . Other Mother        benign brain tumor  . Cancer Maternal Grandfather        breast  . Lupus Paternal Grandmother   . Diabetes Paternal Grandfather     SOCIAL HISTORY:  Social History   Socioeconomic History  . Marital status: Married    Spouse name: Not on file  . Number of children: Not on file  . Years of education: Not on file  . Highest education level: Not on file  Occupational History  . Not on file  Social Needs  . Financial resource strain: Not on file  . Food  insecurity:    Worry: Not on file    Inability: Not on file  . Transportation needs:    Medical: Not on file    Non-medical: Not on file  Tobacco Use  . Smoking status: Never Smoker  . Smokeless tobacco: Never Used  Substance and Sexual Activity  . Alcohol use: Yes    Comment: 1-2 per week  . Drug use: No  . Sexual activity: Yes  Lifestyle  . Physical activity:    Days per week: Not on file    Minutes per session: Not on file  . Stress: Not on file  Relationships  . Social connections:    Talks on phone: Not on file    Gets together: Not on file    Attends religious service: Not on file    Active member of club or organization: Not on file    Attends meetings of clubs or organizations: Not on file    Relationship status: Not on file  . Intimate partner violence:    Fear of current or ex partner: Not on file    Emotionally abused: Not on file    Physically abused: Not on file    Forced sexual activity: Not on file  Other Topics Concern  . Not on file  Social History Narrative   Lives at home with spouse, August SaucerDean, and 3 kids.  Works at [employer].  Education: college.  Caffeine 1-2 per day.      PHYSICAL EXAM  GENERAL EXAM/CONSTITUTIONAL: Vitals:  Vitals:   09/03/17 1129  BP: 111/70  Pulse: 75  Weight: 195 lb 3.2 oz (88.5 kg)  Height: 5\' 7"  (1.702 m)     Body mass index is 30.57 kg/m. Wt Readings from Last 3 Encounters:  09/03/17 195 lb 3.2 oz (88.5 kg)  02/01/17 188 lb (85.3 kg)  12/29/16 185 lb (83.9 kg)     Patient is in no distress; well developed, nourished and groomed; neck is supple  CARDIOVASCULAR:  Examination of carotid arteries is normal; no carotid bruits  Regular rate and rhythm, no murmurs  Examination of peripheral vascular system by observation and palpation is normal  EYES:  Ophthalmoscopic exam of optic discs and posterior segments is normal; no papilledema or hemorrhages  No exam data  present  MUSCULOSKELETAL:  Gait, strength, tone, movements noted in Neurologic exam below  NEUROLOGIC: MENTAL STATUS:  No flowsheet data found.  awake, alert, oriented to person, place and time  recent and remote memory intact  normal attention and concentration  language fluent, comprehension intact, naming intact  fund of knowledge appropriate  CRANIAL NERVE:   2nd - no papilledema on fundoscopic exam  2nd, 3rd, 4th, 6th - pupils equal and reactive to light,  visual fields full to confrontation, extraocular muscles intact, no nystagmus  5th - facial sensation symmetric  7th - facial strength symmetric  8th - hearing intact  9th - palate elevates symmetrically, uvula midline  11th - shoulder shrug symmetric  12th - tongue protrusion midline  MOTOR:   normal bulk and tone, full strength in the BUE, BLE  SENSORY:   normal and symmetric to light touch, temperature, vibration  COORDINATION:   finger-nose-finger, fine finger movements normal  REFLEXES:   deep tendon reflexes TRACE and symmetric  GAIT/STATION:   narrow based gait     DIAGNOSTIC DATA (LABS, IMAGING, TESTING) - I reviewed patient records, labs, notes, testing and imaging myself where available.  Lab Results  Component Value Date   WBC 6.5 08/29/2015   HGB 12.6 08/29/2015   HCT 39.5 08/29/2015   MCV 90.0 08/29/2015   PLT 357 08/29/2015      Component Value Date/Time   NA 137 04/23/2013 0916   K 4.4 04/23/2013 0916   CL 104 04/23/2013 0916   CO2 27 04/23/2013 0916   GLUCOSE 78 04/23/2013 0916   BUN 16 04/23/2013 0916   CREATININE 0.78 04/23/2013 0916   CALCIUM 9.2 04/23/2013 0916   PROT 7.1 04/23/2013 0916   ALBUMIN 4.5 04/23/2013 0916   AST 14 04/23/2013 0916   ALT 12 04/23/2013 0916   ALKPHOS 54 04/23/2013 0916   BILITOT 0.4 04/23/2013 0916   No results found for: CHOL, HDL, LDLCALC, LDLDIRECT, TRIG, CHOLHDL No results found for: VWUJ8J No results found for:  VITAMINB12 Lab Results  Component Value Date   TSH 1.45 06/08/2008       ASSESSMENT AND PLAN  37 y.o. year old female here with typical migraine with aura since age 3 years old. Neuro exam unremarkable. Patient doing well on sumatriptan as needed.  Headaches were 4-5 times per year, but now 3-4 per month (typically leading up to menstrual cycle)   Dx:  1. Migraine with aura and without status migrainosus, not intractable      PLAN:  I spent 15 minutes of face to face time with patient. Greater than 50% of time was spent in counseling and coordination of care with patient. This is necessary because patient's symptoms are not optimally controlled. In summary we discussed: - Diagnostic results, impressions, or recommended diagnostic studies: migraine with aura - Prognosis: good - Risks and benefits of management (treatment) options: change meds  MIGRAINE WITH AURA (worsening) - premedicate with ibuprofen and tylenol leading up to menstrual cycle - change sumatriptan to frovatriptan as needed  Meds ordered this encounter  Medications  . frovatriptan (FROVA) 2.5 MG tablet    Sig: Take 1 tablet (2.5 mg total) by mouth as needed for migraine. If recurs, may repeat after 2 hours. Max of 2 tabs in 24 hours.    Dispense:  8 tablet    Refill:  6   Return in about 6 months (around 03/06/2018) for with NP Ethelene Browns).    Suanne Marker, MD 09/03/2017, 12:30 PM Certified in Neurology, Neurophysiology and Neuroimaging  [facility] [address], [address] [place], [place] [address] [phone]

## 2017-09-04 ENCOUNTER — Encounter: Payer: Self-pay | Admitting: Diagnostic Neuroimaging

## 2017-09-04 NOTE — Telephone Encounter (Signed)
GoodRx was this price as well for generic.  What else do you want to try? Rizatriptan?

## 2017-09-04 NOTE — Telephone Encounter (Signed)
I submitted PA on covermymeds to see if insurance will approve. Key:  [id number]. Waiting on determination.  "Your information has [facility]. [employer] [place] will review the request and fax you a determination directly, typically within 3 business days of your submission once all necessary information is received. If [facility] [place] has not responded in 3 business days or if you have any questions about your submission, contact [facility] [place] at [phone]."

## 2017-09-19 ENCOUNTER — Telehealth: Payer: Self-pay | Admitting: *Deleted

## 2017-09-19 MED ORDER — SUMATRIPTAN 20 MG/ACT NA SOLN: 20.0000 mg | NASAL | 6 refills | Status: DC | PRN

## 2017-09-19 NOTE — Telephone Encounter (Signed)
The frovatriptan PA denied.  They recommend trying generic naratriptan or rizatriptan.  Are any of these ok to try first?  Pt asked about imitrex nasal spray too?

## 2017-09-19 NOTE — Telephone Encounter (Signed)
Sending in sumatriptan nasal. -VRP

## 2017-09-19 NOTE — Telephone Encounter (Signed)
I called pt and let her know that Dr. Marjory LiesPenumalli did place order for the sumatriptan spray,  I gave her the instructions.  She is ok today.  Hopefully they have passed.  Will let us know if not effective for her.

## 2017-10-16 ENCOUNTER — Encounter (HOSPITAL_COMMUNITY): Payer: Self-pay | Admitting: Emergency Medicine

## 2017-10-16 ENCOUNTER — Ambulatory Visit (HOSPITAL_COMMUNITY)
Admission: EM | Admit: 2017-10-16 | Discharge: 2017-10-16 | Disposition: A | Discharge status: Home / Self Care | Payer: BLUE CROSS/BLUE SHIELD | Attending: Family Medicine | Admitting: Family Medicine

## 2017-10-16 DIAGNOSIS — F419 Anxiety disorder, unspecified: Secondary | ICD-10-CM | POA: Diagnosis not present

## 2017-10-16 DIAGNOSIS — R1030 Lower abdominal pain, unspecified: Secondary | ICD-10-CM | POA: Diagnosis not present

## 2017-10-16 DIAGNOSIS — R300 Dysuria: Secondary | ICD-10-CM | POA: Diagnosis not present

## 2017-10-16 DIAGNOSIS — Z87440 Personal history of urinary (tract) infections: Secondary | ICD-10-CM | POA: Insufficient documentation

## 2017-10-16 DIAGNOSIS — Z3202 Encounter for pregnancy test, result negative: Secondary | ICD-10-CM | POA: Diagnosis not present

## 2017-10-16 DIAGNOSIS — Z8249 Family history of ischemic heart disease and other diseases of the circulatory system: Secondary | ICD-10-CM | POA: Insufficient documentation

## 2017-10-16 DIAGNOSIS — R350 Frequency of micturition: Secondary | ICD-10-CM

## 2017-10-16 DIAGNOSIS — F909 Attention-deficit hyperactivity disorder, unspecified type: Secondary | ICD-10-CM | POA: Diagnosis not present

## 2017-10-16 DIAGNOSIS — Z8349 Family history of other endocrine, nutritional and metabolic diseases: Secondary | ICD-10-CM | POA: Diagnosis not present

## 2017-10-16 DIAGNOSIS — K589 Irritable bowel syndrome without diarrhea: Secondary | ICD-10-CM | POA: Insufficient documentation

## 2017-10-16 DIAGNOSIS — R51 Headache: Secondary | ICD-10-CM | POA: Insufficient documentation

## 2017-10-16 DIAGNOSIS — Z79899 Other long term (current) drug therapy: Secondary | ICD-10-CM | POA: Diagnosis not present

## 2017-10-16 DIAGNOSIS — K219 Gastro-esophageal reflux disease without esophagitis: Secondary | ICD-10-CM | POA: Insufficient documentation

## 2017-10-16 LAB — POCT URINALYSIS DIP (DEVICE)
BILIRUBIN URINE: NEGATIVE
Glucose, UA: NEGATIVE mg/dL
HGB URINE DIPSTICK: NEGATIVE
KETONES UR: NEGATIVE mg/dL
Nitrite: NEGATIVE
PH: 7 (ref 5.0–8.0)
Protein, ur: NEGATIVE mg/dL
SPECIFIC GRAVITY, URINE: 1.015 (ref 1.005–1.030)
Urobilinogen, UA: 0.2 mg/dL (ref 0.0–1.0)

## 2017-10-16 LAB — POCT PREGNANCY, URINE: Preg Test, Ur: NEGATIVE

## 2017-10-16 MED ORDER — NITROFURANTOIN MONOHYD MACRO 100 MG PO CAPS: 100.0000 mg | ORAL_CAPSULE | Freq: Two times a day (BID) | ORAL | 0 refills | Status: AC

## 2017-10-16 NOTE — ED Triage Notes (Signed)
Pt here with UTI sx x 3 days

## 2017-10-16 NOTE — ED Provider Notes (Signed)
[facility]    CSN: [id number] Arrival date & time: 10/16/17  1250     History   Chief Complaint Chief Complaint  Patient presents with  . Urinary Tract Infection    HPI Tanya [name] is a 37 y.o. female.   Patient is a 37 year old female that presents with 3 days of dysuria, urinary frequency, suprapubic tenderness and pressure.  The symptoms have been constant and worsening. She has been taking AZO for her symptoms with minimal relief.  Patient does have a history of UTIs.  She denies any vaginitis type symptoms or vaginal bleeding. She denies any lower back pain, fever, chills, body aches. The discomfort is affecting her sleep. She does have a headache.   She does not smoke Family hx of HTN, thyroid, cancer, lupus.   ROS per HPI      Past Medical History:  Diagnosis Date  . ADHD (attention deficit hyperactivity disorder)   . Anemia    postpartum  . Anxiety   . GERD (gastroesophageal reflux disease)   . History of chicken pox   . History of pyelonephritis    as child  . IBS (irritable bowel syndrome)   . Infection    UTI  . Interstitial cystitis   . Migraines   . No pertinent past medical history   . Seasonal allergies   . Tachycardia   . Tilted uterus     Patient Active Problem List   Diagnosis Date Noted  . Migraine with aura and without status migrainosus, not intractable 02/01/2017  . S/P cesarean section 08/07/2015  . Placenta previa antepartum 08/03/2015  . TACHYCARDIA 06/07/2008  . DYSPNEA 06/07/2008    Past Surgical History:  Procedure Laterality Date  . CESAREAN SECTION N/A 08/07/2015   Procedure: CESAREAN SECTION;  Surgeon: Marcelle Overlie, MD;  Location: [facility];  Service: Obstetrics;  Laterality: N/A;  . CESAREAN SECTION    . dilate and curettage  2016  . Tubiligation      OB History    Gravida  5   Para  3   Term  2   Preterm  1   AB  2   Living  3     SAB  2   TAB      Ectopic      Multiple    0   Live Births  3            Home Medications    Prior to Admission medications   Medication Sig Start Date End Date Taking? Authorizing Provider  diazepam (VALIUM) 5 MG tablet Take 5 mg every 6 (six) hours as needed by mouth for anxiety.    [provider]  ibuprofen (ADVIL,MOTRIN) 600 MG tablet Take 1 tablet (600 mg total) by mouth every 6 (six) hours. 08/11/15   Julio Sicks, NP  nitrofurantoin, macrocrystal-monohydrate, (MACROBID) 100 MG capsule Take 1 capsule (100 mg total) by mouth 2 (two) times daily for 7 days. 10/16/17 10/23/17  Janace Aris, NP  SUMAtriptan (IMITREX) 20 MG/ACT nasal spray Place 1 spray (20 mg total) into the nose as needed for migraine or headache. May repeat x 1 after 2 hours. Max 2 sprays per day. 09/19/17   Penumalli, Glenford Bayley, MD  zolpidem (AMBIEN) 5 MG tablet Take 5 mg at bedtime as needed by mouth for sleep.    [provider]    Family History Family History  Problem Relation Age of Onset  . Hypertension Mother   .  Hypothyroidism Mother   . Thyroid disease Mother   . Other Mother        benign brain tumor  . Cancer Maternal Grandfather        breast  . Lupus Paternal Grandmother   . Diabetes Paternal Grandfather     Social History Social History   Tobacco Use  . Smoking status: Never Smoker  . Smokeless tobacco: Never Used  Substance Use Topics  . Alcohol use: Yes    Comment: 1-2 per week  . Drug use: No     Allergies   Patient has no known allergies.   Review of Systems Review of Systems   Physical Exam Triage Vital Signs ED Triage Vitals [10/16/17 1320]  Enc Vitals Group     BP 117/71     Pulse Rate 77     Resp 16     Temp 98.2 F (36.8 C)     Temp Source Oral     SpO2 100 %     Weight      Height      Head Circumference      Peak Flow      Pain Score      Pain Loc      Pain Edu?      Excl. in GC?    No data found.  Updated Vital Signs BP 117/71 (BP Location: Left Arm)   Pulse 77    Temp 98.2 F (36.8 C) (Oral)   Resp 16   SpO2 100%   Visual Acuity Right Eye Distance:   Left Eye Distance:   Bilateral Distance:    Right Eye Near:   Left Eye Near:    Bilateral Near:     Physical Exam  Constitutional: She is oriented to person, place, and time. No distress.  Very pleasant. Non toxic or ill appearing.     HENT:  Head: Normocephalic and atraumatic.  Eyes: Conjunctivae are normal.  Neck: Normal range of motion.  Pulmonary/Chest: Effort normal.  Abdominal: Soft. Bowel sounds are normal.  Very tender to suprapubic area.  No CVA tenderness  Musculoskeletal: Normal range of motion.  Neurological: She is alert and oriented to person, place, and time.  Skin: Skin is warm and dry. She is not diaphoretic.  Psychiatric: She has a normal mood and affect.  Nursing note and vitals reviewed.    UC Treatments / Results  Labs (all labs ordered are listed, but only abnormal results are displayed) Labs Reviewed  POCT URINALYSIS DIP (DEVICE) - Abnormal; Notable for the following components:      Result Value   Leukocytes, UA SMALL (*)    All other components within normal limits  URINE CULTURE  POCT PREGNANCY, URINE    EKG None  Radiology No results found.  Procedures Procedures (including critical care time)  Medications Ordered in UC Medications - No data to display  Initial Impression / Assessment and Plan / UC Course  I have reviewed the triage vital signs and the nursing notes.  Pertinent labs & imaging results that were available during my care of the patient were reviewed by me and considered in my medical decision making (see chart for details).     Urinalysis showed trace leuks without nitrites or hemoglobin.  Negative for pregnancy We will send urine for culture Based on patient presentation, discomfort, symptoms and history of UTIs we will go ahead and treat with Macrobid per patient request awaiting culture results. Patient understanding  and agreeable to  plan  Final Clinical Impressions(s) / UC Diagnoses   Final diagnoses:  Dysuria     Discharge Instructions     It was nice meeting you!!  Your urine had some bacteria.  We will send for culture We will go ahead and treat with Macrobid x7 days based on history and symptoms and tenderness on exam Make sure you are staying hydrated. Pregnancy test was negative Follow up as needed for continued or worsening symptoms     ED Prescriptions    Medication Sig Dispense Auth. Provider   nitrofurantoin, macrocrystal-monohydrate, (MACROBID) 100 MG capsule Take 1 capsule (100 mg total) by mouth 2 (two) times daily for 7 days. 14 capsule Dahlia Byes A, NP     Controlled Substance Prescriptions [place] Controlled Substance Registry consulted? Not Applicable   Janace Aris, NP 10/16/17 1500

## 2017-10-16 NOTE — Discharge Instructions (Signed)
It was nice meeting you!!  Your urine had some bacteria.  We will send for culture We will go ahead and treat with Macrobid x7 days based on history and symptoms and tenderness on exam Make sure you are staying hydrated. Pregnancy test was negative Follow up as needed for continued or worsening symptoms

## 2017-10-18 LAB — URINE CULTURE

## 2017-10-21 ENCOUNTER — Telehealth (HOSPITAL_COMMUNITY): Payer: Self-pay

## 2017-10-21 NOTE — Telephone Encounter (Signed)
Urine culture positive for Enterococcus. thsi was treated with macrobid. Attempted to reach patient. No answer at this time.

## 2017-11-03 DIAGNOSIS — M542 Cervicalgia: Secondary | ICD-10-CM | POA: Diagnosis not present

## 2017-11-15 ENCOUNTER — Ambulatory Visit
Admission: EM | Admit: 2017-11-15 | Discharge: 2017-11-15 | Disposition: A | Discharge status: Home / Self Care | Payer: BLUE CROSS/BLUE SHIELD | Attending: Family Medicine | Admitting: Family Medicine

## 2017-11-15 ENCOUNTER — Encounter: Payer: Self-pay | Admitting: Emergency Medicine

## 2017-11-15 ENCOUNTER — Other Ambulatory Visit: Payer: Self-pay

## 2017-11-15 DIAGNOSIS — R51 Headache: Secondary | ICD-10-CM | POA: Insufficient documentation

## 2017-11-15 DIAGNOSIS — R519 Headache, unspecified: Secondary | ICD-10-CM

## 2017-11-15 DIAGNOSIS — R42 Dizziness and giddiness: Secondary | ICD-10-CM | POA: Diagnosis not present

## 2017-11-15 LAB — COMPREHENSIVE METABOLIC PANEL
ALBUMIN: 4.3 g/dL (ref 3.5–5.0)
ALT: 14 U/L (ref 0–44)
AST: 24 U/L (ref 15–41)
Alkaline Phosphatase: 53 U/L (ref 38–126)
Anion gap: 9 (ref 5–15)
BUN: 22 mg/dL — AB (ref 6–20)
CHLORIDE: 108 mmol/L (ref 98–111)
CO2: 24 mmol/L (ref 22–32)
CREATININE: 0.78 mg/dL (ref 0.44–1.00)
Calcium: 9.6 mg/dL (ref 8.9–10.3)
GFR calc non Af Amer: >60 mL/min (ref >60)
Glucose, Bld: 122 mg/dL — ABNORMAL HIGH (ref 70–99)
Potassium: 3.8 mmol/L (ref 3.5–5.1)
SODIUM: 141 mmol/L (ref 135–145)
Total Bilirubin: 0.4 mg/dL (ref 0.3–1.2)
Total Protein: 7.2 g/dL (ref 6.5–8.1)

## 2017-11-15 LAB — CBC WITH DIFFERENTIAL/PLATELET
BASOS ABS: 0 10*3/uL (ref 0–0.1)
BASOS PCT: 1 %
EOS ABS: 0.1 10*3/uL (ref 0–0.7)
EOS PCT: 1 %
HCT: 43.3 % (ref 35.0–47.0)
Hemoglobin: 14.4 g/dL (ref 12.0–16.0)
Lymphocytes Relative: 25 %
Lymphs Abs: 1.5 10*3/uL (ref 1.0–3.6)
MCH: 29.3 pg (ref 26.0–34.0)
MCHC: 33.2 g/dL (ref 32.0–36.0)
MCV: 88.3 fL (ref 80.0–100.0)
Monocytes Absolute: 0.4 10*3/uL (ref 0.2–0.9)
Monocytes Relative: 7 %
Neutro Abs: 4 10*3/uL (ref 1.4–6.5)
Neutrophils Relative %: 66 %
PLATELETS: 200 10*3/uL (ref 150–440)
RBC: 4.91 MIL/uL (ref 3.80–5.20)
RDW: 12.8 % (ref 11.5–14.5)
WBC: 6 10*3/uL (ref 3.6–11.0)

## 2017-11-15 NOTE — ED Triage Notes (Signed)
Patient c/o HA yesterday and dizziness that started this morning.  Patient denies N/V.

## 2017-11-15 NOTE — Discharge Instructions (Signed)
Increase fluids and potassium rich foods Follow up if symptoms worsen or don't improve

## 2017-11-16 ENCOUNTER — Emergency Department (HOSPITAL_COMMUNITY): Payer: BLUE CROSS/BLUE SHIELD

## 2017-11-16 ENCOUNTER — Emergency Department (HOSPITAL_COMMUNITY)
Admission: EM | Admit: 2017-11-16 | Discharge: 2017-11-16 | Disposition: A | Discharge status: Home / Self Care | Payer: BLUE CROSS/BLUE SHIELD | Attending: Emergency Medicine | Admitting: Emergency Medicine

## 2017-11-16 ENCOUNTER — Other Ambulatory Visit: Payer: Self-pay

## 2017-11-16 DIAGNOSIS — R519 Headache, unspecified: Secondary | ICD-10-CM

## 2017-11-16 DIAGNOSIS — Z79899 Other long term (current) drug therapy: Secondary | ICD-10-CM | POA: Insufficient documentation

## 2017-11-16 DIAGNOSIS — R51 Headache: Secondary | ICD-10-CM | POA: Diagnosis not present

## 2017-11-16 DIAGNOSIS — G43909 Migraine, unspecified, not intractable, without status migrainosus: Secondary | ICD-10-CM | POA: Diagnosis not present

## 2017-11-16 LAB — POC URINE PREG, ED: PREG TEST UR: NEGATIVE

## 2017-11-16 MED ORDER — KETOROLAC TROMETHAMINE 30 MG/ML IJ SOLN
15.0000 mg | Freq: Once | INTRAMUSCULAR | Status: AC
  Administered 2017-11-16: 15 mg via INTRAVENOUS
  Filled 2017-11-16: qty 1

## 2017-11-16 MED ORDER — PROCHLORPERAZINE EDISYLATE 10 MG/2ML IJ SOLN
10.0000 mg | Freq: Once | INTRAMUSCULAR | Status: AC
  Administered 2017-11-16: 10 mg via INTRAVENOUS
  Filled 2017-11-16: qty 2

## 2017-11-16 MED ORDER — SODIUM CHLORIDE 0.9 % IV BOLUS
1000.0000 mL | Freq: Once | INTRAVENOUS | Status: AC
  Administered 2017-11-16: 1000 mL via INTRAVENOUS

## 2017-11-16 NOTE — ED Provider Notes (Signed)
[facility]-EMERGENCY DEPT Provider Note   CSN: [id number] Arrival date & time: 11/16/17  1643     History   Chief Complaint Chief Complaint  Patient presents with  . Headache    HPI Tanya [name] is a 37 y.o. female.  HPI With a history of migraine headaches, presents with atypical headache. Patient is that she typically has sore sensation throughout her head, notes that she now presents with a headache that is more throbbing, atypical, diffuse across the anterior skull Headache is been present for 2 days, not improved with diet changes and use of her typical medication She also complains of new blurry vision intermittently in the left lateral eye. No focal extremity weakness, no confusion, disorientation.  Past Medical History:  Diagnosis Date  . ADHD (attention deficit hyperactivity disorder)   . Anemia    postpartum  . Anxiety   . GERD (gastroesophageal reflux disease)   . History of chicken pox   . History of pyelonephritis    as child  . IBS (irritable bowel syndrome)   . Infection    UTI  . Interstitial cystitis   . Migraines   . No pertinent past medical history   . Seasonal allergies   . Tachycardia   . Tilted uterus     Patient Active Problem List   Diagnosis Date Noted  . Migraine with aura and without status migrainosus, not intractable 02/01/2017  . S/P cesarean section 08/07/2015  . Placenta previa antepartum 08/03/2015  . TACHYCARDIA 06/07/2008  . DYSPNEA 06/07/2008    Past Surgical History:  Procedure Laterality Date  . CESAREAN SECTION N/A 08/07/2015   Procedure: CESAREAN SECTION;  Surgeon: Marcelle Overlie, MD;  Location: [facility] BIRTHING SUITES;  Service: Obstetrics;  Laterality: N/A;  . CESAREAN SECTION    . dilate and curettage  2016  . Tubiligation       OB History    Gravida  5   Para  3   Term  2   Preterm  1   AB  2   Living  3     SAB  2   TAB      Ectopic      Multiple  0   Live Births  3              Home Medications    Prior to Admission medications   Medication Sig Start Date End Date Taking? Authorizing Provider  cyclobenzaprine (FLEXERIL) 5 MG tablet TK 1 T PO QHS PRF SPASM 11/03/17   [provider]  diazepam (VALIUM) 5 MG tablet Take 5 mg every 6 (six) hours as needed by mouth for anxiety.    [provider]  ibuprofen (ADVIL,MOTRIN) 600 MG tablet Take 1 tablet (600 mg total) by mouth every 6 (six) hours. 08/11/15   Julio Sicks, NP  SUMAtriptan (IMITREX) 20 MG/ACT nasal spray Place 1 spray (20 mg total) into the nose as needed for migraine or headache. May repeat x 1 after 2 hours. Max 2 sprays per day. 09/19/17   Penumalli, Glenford Bayley, MD  zolpidem (AMBIEN) 5 MG tablet Take 5 mg at bedtime as needed by mouth for sleep.    [provider]    Family History Family History  Problem Relation Age of Onset  . Hypertension Mother   . Hypothyroidism Mother   . Thyroid disease Mother   . Other Mother        benign brain tumor  . Cancer Maternal  Grandfather        breast  . Lupus Paternal Grandmother   . Diabetes Paternal Grandfather     Social History Social History   Tobacco Use  . Smoking status: Never Smoker  . Smokeless tobacco: Never Used  Substance Use Topics  . Alcohol use: Yes    Comment: 1-2 per week  . Drug use: No     Allergies   Patient has no known allergies.   Review of Systems Review of Systems  Constitutional:       Per HPI, otherwise negative  HENT:       Per HPI, otherwise negative  Respiratory:       Per HPI, otherwise negative  Cardiovascular:       Per HPI, otherwise negative  Gastrointestinal: Negative for vomiting.  Endocrine:       Negative aside from HPI  Genitourinary:       Neg aside from HPI   Musculoskeletal:       Per HPI, otherwise negative  Skin: Negative.   Neurological: Positive for headaches. Negative for syncope.     Physical Exam Updated Vital Signs BP 124/81 (BP Location:  Right Arm)   Pulse 90   Temp 99.2 F (37.3 C) (Oral)   Resp 19   Ht 5\' 7"  (1.702 m)   Wt 88.5 kg   LMP 10/25/2017 (Approximate)   SpO2 100%   BMI 30.54 kg/m   Physical Exam  Constitutional: She is oriented to person, place, and time. She appears well-developed and well-nourished. No distress.  HENT:  Head: Normocephalic and atraumatic.  Eyes: Conjunctivae and EOM are normal.  Cardiovascular: Normal rate and regular rhythm.  Pulmonary/Chest: Effort normal and breath sounds normal. No stridor. No respiratory distress.  Abdominal: She exhibits no distension.  Musculoskeletal: She exhibits no edema.  Neurological: She is alert and oriented to person, place, and time. She has normal strength. No cranial nerve deficit. She displays a negative Romberg sign. Coordination and gait normal.  Skin: Skin is warm and dry.  Psychiatric: She has a normal mood and affect.  Nursing note and vitals reviewed.    ED Treatments / Results  Labs (all labs ordered are listed, but only abnormal results are displayed) Labs Reviewed  POC URINE PREG, ED    EKG None  Radiology Ct Head Wo Contrast  Result Date: 11/16/2017 CLINICAL DATA:  Headache for 3 days. EXAM: CT HEAD WITHOUT CONTRAST TECHNIQUE: Contiguous axial images were obtained from the base of the skull through the vertex without intravenous contrast. COMPARISON:  None. FINDINGS: Brain: No evidence of acute infarction, hemorrhage, hydrocephalus, extra-axial collection or mass lesion/mass effect. Vascular: No hyperdense vessel or unexpected calcification. Skull: Normal. Negative for fracture or focal lesion. Sinuses/Orbits: Visualized globes and orbits are unremarkable. The visualized sinuses and mastoid air cells are clear. Other: None. IMPRESSION: Normal enhanced CT scan of the brain. Electronically Signed   By: Amie Portland M.D.   On: 11/16/2017 20:58    Procedures Procedures (including critical care time)  Medications Ordered in  ED Medications  sodium chloride 0.9 % bolus 1,000 mL (1,000 mLs Intravenous New Bag/Given 11/16/17 2013)  ketorolac (TORADOL) 30 MG/ML injection 15 mg (15 mg Intravenous Given 11/16/17 2017)  prochlorperazine (COMPAZINE) injection 10 mg (10 mg Intravenous Given 11/16/17 2020)     Initial Impression / Assessment and Plan / ED Course  I have reviewed the triage vital signs and the nursing notes.  Pertinent labs & imaging results that were available  during my care of the patient were reviewed by me and considered in my medical decision making (see chart for details).  9:26 PM Patient feels better, though she has not had complete resolution, she feels well enough to go home, will follow up with her neurologist. Absent focal neuro deficits, with reassuring CT scan, and improvement here, this is reasonable.   Final Clinical Impressions(s) / ED Diagnoses   Final diagnoses:  Bad headache      Gerhard Munch, MD 11/16/17 2129

## 2017-11-16 NOTE — Discharge Instructions (Signed)
As discussed, your evaluation today has been largely reassuring.  But, it is important that you monitor your condition carefully, and do not hesitate to return to the ED if you develop new, or concerning changes in your condition. ? ?Otherwise, please follow-up with your physician for appropriate ongoing care. ? ?

## 2017-11-16 NOTE — ED Triage Notes (Signed)
Pt to ED with c/o of headache since Thursday. Pt was seen at [facility] center for the same. Blood work and EKG was done yesterday with potassium slightly low, pt was told to eat some avocados and to come back if H/A didn't go away. Pt is here today because pain is on top of the head and complains of blurry vision spells and "vibrations" spells in her head as well. Pt has hx of migraines but doesn't feel like this is her usual. Pt denies any SOB, Chest Pain, or numbness or tingling.

## 2017-11-20 DIAGNOSIS — Z23 Encounter for immunization: Secondary | ICD-10-CM | POA: Diagnosis not present

## 2017-12-03 ENCOUNTER — Encounter: Payer: Self-pay | Admitting: Emergency Medicine

## 2017-12-03 ENCOUNTER — Ambulatory Visit
Admission: EM | Admit: 2017-12-03 | Discharge: 2017-12-03 | Disposition: A | Discharge status: Home / Self Care | Payer: BLUE CROSS/BLUE SHIELD | Attending: Family Medicine | Admitting: Family Medicine

## 2017-12-03 ENCOUNTER — Other Ambulatory Visit: Payer: Self-pay

## 2017-12-03 DIAGNOSIS — R110 Nausea: Secondary | ICD-10-CM

## 2017-12-03 LAB — URINALYSIS, COMPLETE (UACMP) WITH MICROSCOPIC
BACTERIA UA: NONE SEEN
Bilirubin Urine: NEGATIVE
GLUCOSE, UA: NEGATIVE mg/dL
Hgb urine dipstick: NEGATIVE
Ketones, ur: NEGATIVE mg/dL
Nitrite: NEGATIVE
PROTEIN: NEGATIVE mg/dL
RBC / HPF: NONE SEEN RBC/hpf (ref 0–5)
SQUAMOUS EPITHELIAL / LPF: NONE SEEN (ref 0–5)
Specific Gravity, Urine: 1.01 (ref 1.005–1.030)
pH: 7 (ref 5.0–8.0)

## 2017-12-03 MED ORDER — ONDANSETRON 4 MG PO TBDP: 4.0000 mg | ORAL_TABLET | Freq: Three times a day (TID) | ORAL | 0 refills | Status: DC | PRN

## 2017-12-03 MED ORDER — ONDANSETRON 4 MG PO TBDP
4.0000 mg | ORAL_TABLET | Freq: Once | ORAL | Status: AC
  Administered 2017-12-03: 4 mg via ORAL

## 2017-12-03 MED ORDER — PROMETHAZINE HCL 25 MG PO TABS: 25.0000 mg | ORAL_TABLET | Freq: Four times a day (QID) | ORAL | 0 refills | Status: DC | PRN

## 2017-12-03 NOTE — ED Provider Notes (Signed)
[facility]    CSN: [id number] Arrival date & time: 12/03/17  1518  History   Chief Complaint Chief Complaint  Patient presents with  . Nausea   HPI  37 year old female presents with nausea.  Patient reports that she has not been feeling well since Saturday night.  She states that she is had hot flashes and chills.  No documented fever.  She reports associated diarrhea and nausea.  Diarrhea has now improved.  Patient states that she ate well today until approximately 1 hour ago when she developed severe nausea.  No vomiting.  No reports of abdominal pain.  Patient states that she has some urinary urgency but no other urinary symptoms.  Has a history of frequent UTI.  No known exacerbating factors.  No other associated symptoms.  No other complaints.  PMH, Surgical Hx, Family Hx, Social History reviewed and updated as below.  Past Medical History:  Diagnosis Date  . ADHD (attention deficit hyperactivity disorder)   . Anemia    postpartum  . Anxiety   . GERD (gastroesophageal reflux disease)   . History of chicken pox   . History of pyelonephritis    as child  . IBS (irritable bowel syndrome)   . Infection    UTI  . Interstitial cystitis   . Migraines   . Seasonal allergies   . Tachycardia   . Tilted uterus    Patient Active Problem List   Diagnosis Date Noted  . Migraine with aura and without status migrainosus, not intractable 02/01/2017  . S/P cesarean section 08/07/2015  . Placenta previa antepartum 08/03/2015  . TACHYCARDIA 06/07/2008  . DYSPNEA 06/07/2008   Past Surgical History:  Procedure Laterality Date  . CESAREAN SECTION N/A 08/07/2015   Procedure: CESAREAN SECTION;  Surgeon: Marcelle Overlie, MD;  Location: [facility] BIRTHING SUITES;  Service: Obstetrics;  Laterality: N/A;  . CESAREAN SECTION    . dilate and curettage  2016  . Tubiligation     OB History    Gravida  5   Para  3   Term  2   Preterm  1   AB  2   Living  3     SAB  2   TAB      Ectopic      Multiple  0   Live Births  3          Home Medications    Prior to Admission medications   Medication Sig Start Date End Date Taking? Authorizing Provider  cyclobenzaprine (FLEXERIL) 5 MG tablet TK 1 T PO QHS PRF SPASM 11/03/17  Yes [provider]  ibuprofen (ADVIL,MOTRIN) 600 MG tablet Take 1 tablet (600 mg total) by mouth every 6 (six) hours. 08/11/15  Yes Julio Sicks, NP  SUMAtriptan (IMITREX) 20 MG/ACT nasal spray Place 1 spray (20 mg total) into the nose as needed for migraine or headache. May repeat x 1 after 2 hours. Max 2 sprays per day. 09/19/17  Yes Penumalli, Glenford Bayley, MD  zolpidem (AMBIEN) 5 MG tablet Take 5 mg at bedtime as needed by mouth for sleep.   Yes [provider]  ondansetron (ZOFRAN-ODT) 4 MG disintegrating tablet Take 1 tablet (4 mg total) by mouth every 8 (eight) hours as needed for nausea or vomiting. 12/03/17   Tommie Sams, DO  promethazine (PHENERGAN) 25 MG tablet Take 1 tablet (25 mg total) by mouth every 6 (six) hours as needed for nausea or vomiting. 12/03/17   Everlene Other  G, DO   Family History Family History  Problem Relation Age of Onset  . Hypertension Mother   . Hypothyroidism Mother   . Thyroid disease Mother   . Other Mother        benign brain tumor  . Cancer Maternal Grandfather        breast  . Lupus Paternal Grandmother   . Diabetes Paternal Grandfather    Social History Social History   Tobacco Use  . Smoking status: Never Smoker  . Smokeless tobacco: Never Used  Substance Use Topics  . Alcohol use: Yes    Comment: 1-2 per week  . Drug use: No   Allergies   Patient has no known allergies.  Review of Systems Review of Systems  Constitutional: Positive for chills. Negative for fever.  Gastrointestinal: Positive for diarrhea and nausea.   Physical Exam Triage Vital Signs ED Triage Vitals  Enc Vitals Group     BP 12/03/17 1529 133/86     Pulse Rate 12/03/17 1529 96     Resp  12/03/17 1529 18     Temp 12/03/17 1529 98.8 F (37.1 C)     Temp Source 12/03/17 1529 Oral     SpO2 12/03/17 1529 98 %     Weight 12/03/17 1527 195 lb (88.5 kg)     Height 12/03/17 1527 5\' 7"  (1.702 m)     Head Circumference --      Peak Flow --      Pain Score 12/03/17 1527 0     Pain Loc --      Pain Edu? --      Excl. in GC? --    Updated Vital Signs BP 133/86 (BP Location: Left Arm)   Pulse 96   Temp 98.8 F (37.1 C) (Oral)   Resp 18   Ht 5\' 7"  (1.702 m)   Wt 88.5 kg   LMP 11/19/2017 (Approximate)   SpO2 98%   BMI 30.54 kg/m   Visual Acuity Right Eye Distance:   Left Eye Distance:   Bilateral Distance:    Right Eye Near:   Left Eye Near:    Bilateral Near:     Physical Exam  Constitutional: She is oriented to person, place, and time. She appears well-developed.  Appears fatigued.  HENT:  Head: Normocephalic and atraumatic.  Eyes: Conjunctivae are normal. Right eye exhibits no discharge. Left eye exhibits no discharge.  Cardiovascular: Normal rate and regular rhythm.  Pulmonary/Chest: Effort normal and breath sounds normal. She has no wheezes. She has no rales.  Abdominal: Soft. She exhibits no distension.  Left lower quadrant tenderness as well as suprapubic tenderness.  Neurological: She is alert and oriented to person, place, and time.  Psychiatric: She has a normal mood and affect. Her behavior is normal.  Nursing note and vitals reviewed.  UC Treatments / Results  Labs (all labs ordered are listed, but only abnormal results are displayed) Labs Reviewed  URINALYSIS, COMPLETE (UACMP) WITH MICROSCOPIC - Abnormal; Notable for the following components:      Result Value   Color, Urine STRAW (*)    Leukocytes, UA TRACE (*)    All other components within normal limits    EKG None  Radiology No results found.  Procedures Procedures (including critical care time)  Medications Ordered in UC Medications  ondansetron (ZOFRAN-ODT) disintegrating  tablet 4 mg (4 mg Oral Given 12/03/17 1545)    Initial Impression / Assessment and Plan / UC Course  I have reviewed  the triage vital signs and the nursing notes.  Pertinent labs & imaging results that were available during my care of the patient were reviewed by me and considered in my medical decision making (see chart for details).    37 year old female presents with a suspected viral illness.  Her primary complaint currently is nausea.  She has been eating well today.  She has been drinking well.  Push fluids.  Supportive care.  Zofran and Phenergan as needed.  Work note given.  Final Clinical Impressions(s) / UC Diagnoses   Final diagnoses:  Nausea     Discharge Instructions     Rest, fluids.  Medication as prescribed.  Take care  Dr. Adriana Simas     ED Prescriptions    Medication Sig Dispense Auth. Provider   ondansetron (ZOFRAN-ODT) 4 MG disintegrating tablet Take 1 tablet (4 mg total) by mouth every 8 (eight) hours as needed for nausea or vomiting. 20 tablet Brithney Bensen G, DO   promethazine (PHENERGAN) 25 MG tablet Take 1 tablet (25 mg total) by mouth every 6 (six) hours as needed for nausea or vomiting. 30 tablet Tommie Sams, DO     Controlled Substance Prescriptions [place] Controlled Substance Registry consulted? Not Applicable   Tommie Sams, [place] 12/03/17 1632

## 2017-12-03 NOTE — Discharge Instructions (Signed)
Rest, fluids. ° °Medication as prescribed. ° °Take care ° °Dr. Jakyla Reza  °

## 2017-12-03 NOTE — ED Triage Notes (Signed)
Pt c/o hot flashes/cold chills, nausea, and diarrhea. Started 3 days ago. No abdominal pain.

## 2017-12-04 ENCOUNTER — Telehealth: Payer: Self-pay | Admitting: Diagnostic Neuroimaging

## 2017-12-04 DIAGNOSIS — R232 Flushing: Secondary | ICD-10-CM | POA: Diagnosis not present

## 2017-12-04 DIAGNOSIS — N926 Irregular menstruation, unspecified: Secondary | ICD-10-CM | POA: Diagnosis not present

## 2017-12-04 DIAGNOSIS — N951 Menopausal and female climacteric states: Secondary | ICD-10-CM | POA: Diagnosis not present

## 2017-12-04 DIAGNOSIS — F419 Anxiety disorder, unspecified: Secondary | ICD-10-CM | POA: Diagnosis not present

## 2017-12-04 DIAGNOSIS — Z30430 Encounter for insertion of intrauterine contraceptive device: Secondary | ICD-10-CM | POA: Diagnosis not present

## 2017-12-04 DIAGNOSIS — L680 Hirsutism: Secondary | ICD-10-CM | POA: Diagnosis not present

## 2017-12-04 NOTE — Telephone Encounter (Signed)
Pt requesting RN call to discuss working her in per [employer]

## 2017-12-04 NOTE — Telephone Encounter (Signed)
Per Dr Marjory Lies, had discussed via my chart for patient to come in sooner to see him or NP to discuss other migraine medications. Sumatriptan nasal spray is no longer effective.  Scheduled her to be seen by NP next Tues, and advised she arrive 30 minutes early. She verbalized understanding, appreciation.

## 2017-12-05 LAB — TSH: TSH: 1.23 (ref 0.41–5.90)

## 2017-12-10 ENCOUNTER — Ambulatory Visit: Payer: BLUE CROSS/BLUE SHIELD | Admitting: Adult Health

## 2017-12-10 ENCOUNTER — Encounter: Payer: Self-pay | Admitting: Adult Health

## 2017-12-10 VITALS — BP 112/75 | HR 97 | Ht 67.0 in | Wt 195.2 lb

## 2017-12-10 DIAGNOSIS — G43109 Migraine with aura, not intractable, without status migrainosus: Secondary | ICD-10-CM

## 2017-12-10 LAB — T4, FREE: Free T4: 3.4

## 2017-12-10 LAB — FOLLICLE STIMULATING HORMONE: FSH: 9.3

## 2017-12-10 MED ORDER — RIZATRIPTAN BENZOATE 10 MG PO TBDP: ORAL_TABLET | ORAL | 11 refills | Status: DC

## 2017-12-10 NOTE — Patient Instructions (Signed)
Start Maxalt- take 1 tab at the onset of migraine- can repeat in 2 hours if needed. Not to exceed 2tabs/24 hours. If your symptoms worsen or you develop new symptoms please let us know.    Please be aware thatMaxalt and Lexaprotogether can sometimes cause a rare and potentially dangerous adverse reaction, called SEROTONIN SYNDROME: Symptoms of this condition include (but are not limited to):  Agitation or restlessness, confusion, rapid heart rate and high blood pressure, dilated pupils, loss of muscle coordination or twitching muscles, muscle rigidity/stiffness, sweating and/or flushing, diarrhea, headache, shivering, goose bumps. If you have any of these symptoms you may have to stop the medication. Call your health care provider immediately.  Severe serotonin syndrome can be life-threatening emergency. Signs and symptoms of a severe reaction may include: high fever, seizures, irregular heartbeat, unconsciousness or altered level of awareness or personality changes.  If you have any of these new symptoms, call 911 or have someone take you to the emergency room.

## 2017-12-10 NOTE — Progress Notes (Signed)
PATIENT: Tanya [name] DOB: 1980-12-13  REASON FOR VISIT: follow up HISTORY FROM: patient  HISTORY OF PRESENT ILLNESS: Today 12/10/17:  Tanya [name] is a 37 year old female with a history of migraines.  He states that she has approximately 1 migraine a month.  It normally occurs the day before or the day of her menstrual cycle.  She states that her headache typically is precipitated by dizziness 1 to 2 days before the headache starts.  She states in the past she did take Imitrex nasal spray and it works well.  She states now is not giving her much benefit.  She states occasionally she has she will take the first dose and go to bed.  She recently had an IUD placed in hopes that that helps with her headaches as well.  In the past she has tried oral Imitrex with little benefit.  She returns today for evaluation.  HISTORY UPDATE (09/03/17, VRP): Since last visit, doing worse with HA. Symptoms are 3-4 per month, leading up to menstrual cycle. Severity is moderate. No other alleviating or aggravating factors. Tolerating meds, but sumatriptan not that effective.    PRIOR HPI (02/01/17, VRP): 37 year old female here for evaluation of headaches.  In her 31s patient had onset of headaches, right side of her head, with sharp throbbing pain, photophobia, phonophobia and nausea.  These will be preceded by left eye visual disturbance, squiggly lines, inability to see and streaming visual disturbances.  At the visual disturbances of occurred 3 times in her life, first in 2011.  She has had other episodes of headaches on the right side with similar symptoms 4-5 times per year.  No significant dizziness or headache.  Triggering factors in the past have included hormone changes and birth control pill.  Patient has family history of migraine in her mother.  Patient has been prescribed sumatriptan which seems to help.  She has seen eye doctor 2 years ago but not recently.   REVIEW OF SYSTEMS: Out of a complete  14 system review of symptoms, the patient complains only of the following symptoms, and all other reviewed systems are negative.  Flushing, dizziness, headache, tremors, nervous  ALLERGIES: No Known Allergies  HOME MEDICATIONS: Outpatient Medications Prior to Visit  Medication Sig Dispense Refill  . ALPRAZolam (XANAX) 0.25 MG tablet Take 25 mg by mouth as needed.  0  . cyclobenzaprine (FLEXERIL) 5 MG tablet TK 1 T PO QHS PRF SPASM  0  . escitalopram (LEXAPRO) 10 MG tablet Take 10 mg by mouth daily.  12  . ibuprofen (ADVIL,MOTRIN) 600 MG tablet Take 1 tablet (600 mg total) by mouth every 6 (six) hours. 30 tablet 1  . levonorgestrel (MIRENA, 52 MG,) 20 MCG/24HR IUD 1 each by Intrauterine route once.    . ondansetron (ZOFRAN-ODT) 4 MG disintegrating tablet Take 1 tablet (4 mg total) by mouth every 8 (eight) hours as needed for nausea or vomiting. 20 tablet 0  . promethazine (PHENERGAN) 25 MG tablet Take 1 tablet (25 mg total) by mouth every 6 (six) hours as needed for nausea or vomiting. 30 tablet 0  . SUMAtriptan (IMITREX) 20 MG/ACT nasal spray Place 1 spray (20 mg total) into the nose as needed for migraine or headache. May repeat x 1 after 2 hours. Max 2 sprays per day. 4 Inhaler 6  . zolpidem (AMBIEN) 5 MG tablet Take 5 mg at bedtime as needed by mouth for sleep.     No facility-administered medications prior to visit.  PAST MEDICAL HISTORY: Past Medical History:  Diagnosis Date  . ADHD (attention deficit hyperactivity disorder)   . Anemia    postpartum  . Anxiety   . GERD (gastroesophageal reflux disease)   . History of chicken pox   . History of pyelonephritis    as child  . IBS (irritable bowel syndrome)   . Infection    UTI  . Interstitial cystitis   . Migraines   . Seasonal allergies   . Tachycardia   . Tilted uterus     PAST SURGICAL HISTORY: Past Surgical History:  Procedure Laterality Date  . CESAREAN SECTION N/A 08/07/2015   Procedure: CESAREAN SECTION;   Surgeon: Marcelle Overlie, MD;  Location: [facility]ING SU[facility];  Service: Obstetrics;  Laterality: N/A;  . CESAREAN SECTION    . dilate and curettage  2016  . Tubiligation      FAMILY HISTORY: Family History  Problem Relation Age of Onset  . Hypertension Mother   . Hypothyroidism Mother   . Thyroid disease Mother   . Other Mother        benign brain tumor  . Cancer Maternal Grandfather        breast  . Lupus Paternal Grandmother   . Diabetes Paternal Grandfather     SOCIAL HISTORY: Social History   Socioeconomic History  . Marital status: Married    Spouse name: Not on file  . Number of children: Not on file  . Years of education: Not on file  . Highest education level: Not on file  Occupational History  . Not on file  Social Needs  . Financial resource strain: Not on file  . Food insecurity:    Worry: Not on file    Inability: Not on file  . Transportation needs:    Medical: Not on file    Non-medical: Not on file  Tobacco Use  . Smoking status: Never Smoker  . Smokeless tobacco: Never Used  Substance and Sexual Activity  . Alcohol use: Yes    Comment: 1-2 per week  . Drug use: No  . Sexual activity: Yes  Lifestyle  . Physical activity:    Days per week: Not on file    Minutes per session: Not on file  . Stress: Not on file  Relationships  . Social connections:    Talks on phone: Not on file    Gets together: Not on file    Attends religious service: Not on file    Active member of club or organization: Not on file    Attends meetings of clubs or organizations: Not on file    Relationship status: Not on file  . Intimate partner violence:    Fear of current or ex partner: Not on file    Emotionally abused: Not on file    Physically abused: Not on file    Forced sexual activity: Not on file  Other Topics Concern  . Not on file  Social History Narrative   Lives at home with spouse, Tanya [name], and 3 kids.  Works at [employer].  Education:  college.  Caffeine 1-2 per day.       PHYSICAL EXAM  Vitals:   12/10/17 1418  BP: 112/75  Pulse: 97  Weight: 195 lb 3.2 oz (88.5 kg)  Height: 5\' 7"  (1.702 m)   Body mass index is 30.57 kg/m.  Generalized: Well developed, in no acute distress   Neurological examination  Mentation: Alert oriented to time, place, history taking. Follows  all commands speech and language fluent Cranial nerve II-XII: Pupils were equal round reactive to light. Extraocular movements were full, visual field were full on confrontational test. Facial sensation and strength were normal. Uvula tongue midline. Head turning and shoulder shrug  were normal and symmetric. Motor: The motor testing reveals 5 over 5 strength of all 4 extremities. Good symmetric motor tone is noted throughout.  Sensory: Sensory testing is intact to soft touch on all 4 extremities. No evidence of extinction is noted.  Coordination: Cerebellar testing reveals good finger-nose-finger and heel-to-shin bilaterally.  Gait and station: Gait is normal. Tandem gait is normal. Romberg is negative. No drift is seen.  Reflexes: Deep tendon reflexes are symmetric and normal bilaterally.   DIAGNOSTIC DATA (LABS, IMAGING, TESTING) - I reviewed patient records, labs, notes, testing and imaging myself where available.  Lab Results  Component Value Date   WBC 6.0 11/15/2017   HGB 14.4 11/15/2017   HCT 43.3 11/15/2017   MCV 88.3 11/15/2017   PLT 200 11/15/2017      Component Value Date/Time   NA 141 11/15/2017 0903   K 3.8 11/15/2017 0903   CL 108 11/15/2017 0903   CO2 24 11/15/2017 0903   GLUCOSE 122 (H) 11/15/2017 0903   BUN 22 (H) 11/15/2017 0903   CREATININE 0.78 11/15/2017 0903   CREATININE 0.78 04/23/2013 0916   CALCIUM 9.6 11/15/2017 0903   PROT 7.2 11/15/2017 0903   ALBUMIN 4.3 11/15/2017 0903   AST 24 11/15/2017 0903   ALT 14 11/15/2017 0903   ALKPHOS 53 11/15/2017 0903   BILITOT 0.4 11/15/2017 0903   GFRNONAA >60 11/15/2017  0903   GFRAA >60 11/15/2017 0903    Lab Results  Component Value Date   TSH 1.45 06/08/2008      ASSESSMENT AND PLAN 36 y.o. year old female  has a past medical history of ADHD (attention deficit hyperactivity disorder), Anemia, Anxiety, GERD (gastroesophageal reflux disease), History of chicken pox, History of pyelonephritis, IBS (irritable bowel syndrome), Infection, Interstitial cystitis, Migraines, Seasonal allergies, Tachycardia, and Tilted uterus. here with:  1.  Migraine headaches  The patient will be switched to Maxalt.  She was advised that she should take this medication as soon as the headache starts and then repeat in 2 hours if not resolved.  She should not exceed 2 tablets in 24 hours.  If this medication is not beneficial we will switch her to frovatriptan.  Her insurance required her to try Maxalt or naratriptan before it would cover frovatriptan.  Patient is advised that if her symptoms worsen or she develops new symptoms she should let us know.   The patient was recently started on Lexapro.  I advised that the potential side effects of serotonin syndrome when taking Lexapro and Maxalt.  Patient was given a handout.  She will follow-up in 6 months or sooner if needed.   I spent 15 minutes with the patient. 50% of this time was spent reviewing plan of care   Butch Penny, MSN, NP-C 12/10/2017, 2:30 PM [facility] [address], [address] [place], [place] [address] [phone]

## 2017-12-24 ENCOUNTER — Ambulatory Visit: Payer: BLUE CROSS/BLUE SHIELD | Admitting: Adult Health

## 2017-12-24 ENCOUNTER — Encounter: Payer: Self-pay | Admitting: Adult Health

## 2017-12-24 VITALS — BP 92/60 | HR 68 | Temp 98.7°F | Ht 67.0 in | Wt 193.3 lb

## 2017-12-24 DIAGNOSIS — Z0000 Encounter for general adult medical examination without abnormal findings: Secondary | ICD-10-CM | POA: Diagnosis not present

## 2017-12-24 DIAGNOSIS — G43109 Migraine with aura, not intractable, without status migrainosus: Secondary | ICD-10-CM

## 2017-12-24 DIAGNOSIS — F411 Generalized anxiety disorder: Secondary | ICD-10-CM

## 2017-12-24 DIAGNOSIS — Z833 Family history of diabetes mellitus: Secondary | ICD-10-CM

## 2017-12-24 DIAGNOSIS — K219 Gastro-esophageal reflux disease without esophagitis: Secondary | ICD-10-CM | POA: Diagnosis not present

## 2017-12-24 DIAGNOSIS — F410 Panic disorder [episodic paroxysmal anxiety] without agoraphobia: Secondary | ICD-10-CM | POA: Insufficient documentation

## 2017-12-24 DIAGNOSIS — D649 Anemia, unspecified: Secondary | ICD-10-CM | POA: Insufficient documentation

## 2017-12-24 HISTORY — DX: Encounter for general adult medical examination without abnormal findings: Z00.00

## 2017-12-24 MED ORDER — OMEPRAZOLE 20 MG PO CPDR: 20.0000 mg | DELAYED_RELEASE_CAPSULE | Freq: Every day | ORAL | 3 refills | Status: DC

## 2017-12-24 MED ORDER — ALPRAZOLAM 0.25 MG PO TABS: 0.2500 mg | ORAL_TABLET | Freq: Three times a day (TID) | ORAL | 0 refills | Status: DC | PRN

## 2017-12-24 NOTE — Assessment & Plan Note (Signed)
Continue all medications as directed. One final refill on Alprazolam (Xanax) provided. Referral to Behavioral Health placed. Increase water intake, strive for at least 85 ounces/day.   Follow Mediterraenean diet Increase regular exercise.  Recommend at least 30 minutes daily, 5 days per week of walking, jogging, biking, swimming, YouTube/Pinterest workout videos. Please schedule complete physical in 4 weeks, fasting lab appt the week prior. Continue follow-up with Neurology as directed.

## 2017-12-24 NOTE — Patient Instructions (Signed)
Mediterranean Diet A Mediterranean diet refers to food and lifestyle choices that are based on the traditions of countries located on the [place]. This way of eating has been shown to help prevent certain conditions and improve outcomes for people who have chronic diseases, like kidney disease and heart disease. What are tips for following this plan? Lifestyle  Cook and eat meals together with your family, when possible.  Drink enough fluid to keep your urine clear or pale yellow.  Be physically active every day. This includes: ? Aerobic exercise like running or swimming. ? Leisure activities like gardening, walking, or housework.  Get 7-8 hours of sleep each night.  If recommended by your health care provider, drink red wine in moderation. This means 1 glass a day for nonpregnant women and 2 glasses a day for men. A glass of wine equals 5 oz (150 mL). Reading food labels  Check the serving size of packaged foods. For foods such as rice and pasta, the serving size refers to the amount of cooked product, not dry.  Check the total fat in packaged foods. Avoid foods that have saturated fat or trans fats.  Check the ingredients list for added sugars, such as corn syrup. Shopping  At the grocery store, buy most of your food from the areas near the walls of the store. This includes: ? Fresh fruits and vegetables (produce). ? Grains, beans, nuts, and seeds. Some of these may be available in unpackaged forms or large amounts (in bulk). ? Fresh seafood. ? Poultry and eggs. ? Low-fat dairy products.  Buy whole ingredients instead of prepackaged foods.  Buy fresh fruits and vegetables in-season from local farmers markets.  Buy frozen fruits and vegetables in resealable bags.  If you do not have access to quality fresh seafood, buy precooked frozen shrimp or canned fish, such as tuna, salmon, or sardines.  Buy small amounts of raw or cooked vegetables, salads, or olives from the  deli or salad bar at your store.  Stock your pantry so you always have certain foods on hand, such as olive oil, canned tuna, canned tomatoes, rice, pasta, and beans. Cooking  Cook foods with extra-virgin olive oil instead of using butter or other vegetable oils.  Have meat as a side dish, and have vegetables or grains as your main dish. This means having meat in small portions or adding small amounts of meat to foods like pasta or stew.  Use beans or vegetables instead of meat in common dishes like chili or lasagna.  Experiment with different cooking methods. Try roasting or broiling vegetables instead of steaming or sauteing them.  Add frozen vegetables to soups, stews, pasta, or rice.  Add nuts or seeds for added healthy fat at each meal. You can add these to yogurt, salads, or vegetable dishes.  Marinate fish or vegetables using olive oil, lemon juice, garlic, and fresh herbs. Meal planning  Plan to eat 1 vegetarian meal one day each week. Try to work up to 2 vegetarian meals, if possible.  Eat seafood 2 or more times a week.  Have healthy snacks readily available, such as: ? Vegetable sticks with hummus. ? Greek yogurt. ? Fruit and nut trail mix.  Eat balanced meals throughout the week. This includes: ? Fruit: 2-3 servings a day ? Vegetables: 4-5 servings a day ? Low-fat dairy: 2 servings a day ? Fish, poultry, or lean meat: 1 serving a day ? Beans and legumes: 2 or more servings a week ? Nuts   and seeds: 1-2 servings a day ? Whole grains: 6-8 servings a day ? Extra-virgin olive oil: 3-4 servings a day  Limit red meat and sweets to only a few servings a month What are my food choices?  Mediterranean diet ? Recommended ? Grains: Whole-grain pasta. Brown rice. Bulgar wheat. Polenta. Couscous. Whole-wheat bread. [place]. ? Vegetables: Artichokes. Beets. Broccoli. Cabbage. Carrots. Eggplant. Green beans. Chard. Kale. Spinach. Onions. Leeks. Peas. Squash.  Tomatoes. Peppers. Radishes. ? Fruits: Apples. Apricots. Avocado. Berries. Bananas. Cherries. Dates. Figs. Grapes. Lemons. Melon. Oranges. Peaches. Plums. Pomegranate. ? Meats and other protein foods: Beans. Almonds. Sunflower seeds. Pine nuts. Peanuts. [place]. Salmon. Scallops. Shrimp. [place]. Tilapia. Clams. Oysters. Eggs. ? Dairy: Low-fat milk. Cheese. Greek yogurt. ? Beverages: Water. Red wine. Herbal tea. ? Fats and oils: Extra virgin olive oil. Avocado oil. Grape seed oil. ? Sweets and desserts: [place] yogurt with honey. Baked apples. Poached pears. Trail mix. ? Seasoning and other foods: Basil. Cilantro. Coriander. Cumin. Mint. Parsley. Sage. Rosemary. Tarragon. Garlic. Oregano. Thyme. Pepper. Balsalmic vinegar. Tahini. Hummus. Tomato sauce. Olives. Mushrooms. ? Limit these ? Grains: Prepackaged pasta or rice dishes. Prepackaged cereal with added sugar. ? Vegetables: Deep fried potatoes (french fries). ? Fruits: Fruit canned in syrup. ? Meats and other protein foods: Beef. Pork. Lamb. Poultry with skin. Hot dogs. [place] Salines. ? Dairy: Ice cream. Sour cream. Whole milk. ? Beverages: Juice. Sugar-sweetened soft drinks. Beer. Liquor and spirits. ? Fats and oils: Butter. Canola oil. Vegetable oil. Beef fat (tallow). Lard. ? Sweets and desserts: Cookies. Cakes. Pies. Candy. ? Seasoning and other foods: Mayonnaise. Premade sauces and marinades. ? The items listed may not be a complete list. Talk with your dietitian about what dietary choices are right for you. Summary  The Mediterranean diet includes both food and lifestyle choices.  Eat a variety of fresh fruits and vegetables, beans, nuts, seeds, and whole grains.  Limit the amount of red meat and sweets that you eat.  Talk with your health care provider about whether it is safe for you to drink red wine in moderation. This means 1 glass a day for nonpregnant women and 2 glasses a day for men. A glass of wine equals 5 oz (150 mL). This information  is not intended to replace advice given to you by your health care provider. Make sure you discuss any questions you have with your health care provider. Document Released: 09/22/2015 Document Revised: 10/25/2015 Document Reviewed: 09/22/2015 Elsevier Interactive Patient Education  2018 [employer].  Insomnia Insomnia is a sleep disorder that makes it difficult to fall asleep or to stay asleep. Insomnia can cause tiredness (fatigue), low energy, difficulty concentrating, mood swings, and poor performance at work or school. There are three different ways to classify insomnia:  Difficulty falling asleep.  Difficulty staying asleep.  Waking up too early in the morning.  Any type of insomnia can be long-term (chronic) or short-term (acute). Both are common. Short-term insomnia usually lasts for three months or less. Chronic insomnia occurs at least three times a week for longer than three months. What are the causes? Insomnia may be caused by another condition, situation, or substance, such as:  Anxiety.  Certain medicines.  Gastroesophageal reflux disease (GERD) or other gastrointestinal conditions.  Asthma or other breathing conditions.  Restless legs syndrome, sleep apnea, or other sleep disorders.  Chronic pain.  Menopause. This may include hot flashes.  Stroke.  Abuse of alcohol, tobacco, or illegal drugs.  Depression.  Caffeine.  Neurological disorders,  such as Alzheimer disease.  An overactive thyroid (hyperthyroidism).  The cause of insomnia may not be known. What increases the risk? Risk factors for insomnia include:  Gender. Women are more commonly affected than men.  Age. Insomnia is more common as you get older.  Stress. This may involve your professional or personal life.  Income. Insomnia is more common in people with lower income.  Lack of exercise.  Irregular work schedule or night shifts.  Traveling between different time zones.  What are  the signs or symptoms? If you have insomnia, trouble falling asleep or trouble staying asleep is the main symptom. This may lead to other symptoms, such as:  Feeling fatigued.  Feeling nervous about going to sleep.  Not feeling rested in the morning.  Having trouble concentrating.  Feeling irritable, anxious, or depressed.  How is this treated? Treatment for insomnia depends on the cause. If your insomnia is caused by an underlying condition, treatment will focus on addressing the condition. Treatment may also include:  Medicines to help you sleep.  Counseling or therapy.  Lifestyle adjustments.  Follow these instructions at home:  Take medicines only as directed by your health care provider.  Keep regular sleeping and waking hours. Avoid naps.  Keep a sleep diary to help you and your health care provider figure out what could be causing your insomnia. Include: ? When you sleep. ? When you wake up during the night. ? How well you sleep. ? How rested you feel the next day. ? Any side effects of medicines you are taking. ? What you eat and drink.  Make your bedroom a comfortable place where it is easy to fall asleep: ? Put up shades or special blackout curtains to block light from outside. ? Use a white noise machine to block noise. ? Keep the temperature cool.  Exercise regularly as directed by your health care provider. Avoid exercising right before bedtime.  Use relaxation techniques to manage stress. Ask your health care provider to suggest some techniques that may work well for you. These may include: ? Breathing exercises. ? Routines to release muscle tension. ? Visualizing peaceful scenes.  Cut back on alcohol, caffeinated beverages, and cigarettes, especially close to bedtime. These can disrupt your sleep.  Do not overeat or eat spicy foods right before bedtime. This can lead to digestive discomfort that can make it hard for you to sleep.  Limit screen use  before bedtime. This includes: ? Watching TV. ? Using your smartphone, tablet, and computer.  Stick to a routine. This can help you fall asleep faster. Try to do a quiet activity, brush your teeth, and go to bed at the same time each night.  Get out of bed if you are still awake after 15 minutes of trying to sleep. Keep the lights down, but try reading or doing a quiet activity. When you feel sleepy, go back to bed.  Make sure that you drive carefully. Avoid driving if you feel very sleepy.  Keep all follow-up appointments as directed by your health care provider. This is important. Contact a health care provider if:  You are tired throughout the day or have trouble in your daily routine due to sleepiness.  You continue to have sleep problems or your sleep problems get worse. Get help right away if:  You have serious thoughts about hurting yourself or someone else. This information is not intended to replace advice given to you by your health care provider. Make sure you  discuss any questions you have with your health care provider. Document Released: 01/27/2000 Document Revised: 07/01/2015 Document Reviewed: 10/30/2013 Elsevier Interactive Patient Education  2018 [employer]   Generalized Anxiety Disorder, Adult Generalized anxiety disorder (GAD) is a mental health disorder. People with this condition constantly worry about everyday events. Unlike normal anxiety, worry related to GAD is not triggered by a specific event. These worries also do not fade or get better with time. GAD interferes with life functions, including relationships, work, and school. GAD can vary from mild to severe. People with severe GAD can have intense waves of anxiety with physical symptoms (panic attacks). What are the causes? The exact cause of GAD is not known. What increases the risk? This condition is more likely to develop in:  Women.  People who have a family history of anxiety  disorders.  People who are very shy.  People who experience very stressful life events, such as the death of a loved one.  People who have a very stressful family environment.  What are the signs or symptoms? People with GAD often worry excessively about many things in their lives, such as their health and family. They may also be overly concerned about:  Doing well at work.  Being on time.  Natural disasters.  Friendships.  Physical symptoms of GAD include:  Fatigue.  Muscle tension or having muscle twitches.  Trembling or feeling shaky.  Being easily startled.  Feeling like your heart is pounding or racing.  Feeling out of breath or like you cannot take a deep breath.  Having trouble falling asleep or staying asleep.  Sweating.  Nausea, diarrhea, or irritable bowel syndrome (IBS).  Headaches.  Trouble concentrating or remembering facts.  Restlessness.  Irritability.  How is this diagnosed? Your health care provider can diagnose GAD based on your symptoms and medical history. You will also have a physical exam. The health care provider will ask specific questions about your symptoms, including how severe they are, when they started, and if they come and go. Your health care provider may ask you about your use of alcohol or drugs, including prescription medicines. Your health care provider may refer you to a mental health specialist for further evaluation. Your health care provider will do a thorough examination and may perform additional tests to rule out other possible causes of your symptoms. To be diagnosed with GAD, a person must have anxiety that:  Is out of his or her control.  Affects several different aspects of his or her life, such as work and relationships.  Causes distress that makes him or her unable to take part in normal activities.  Includes at least three physical symptoms of GAD, such as restlessness, fatigue, trouble concentrating,  irritability, muscle tension, or sleep problems.  Before your health care provider can confirm a diagnosis of GAD, these symptoms must be present more days than they are not, and they must last for six months or longer. How is this treated? The following therapies are usually used to treat GAD:  Medicine. Antidepressant medicine is usually prescribed for long-term daily control. Antianxiety medicines may be added in severe cases, especially when panic attacks occur.  Talk therapy (psychotherapy). Certain types of talk therapy can be helpful in treating GAD by providing support, education, and guidance. Options include: ? Cognitive behavioral therapy (CBT). People learn coping skills and techniques to ease their anxiety. They learn to identify unrealistic or negative thoughts and behaviors and to replace them with positive ones. ?  Acceptance and commitment therapy (ACT). This treatment teaches people how to be mindful as a way to cope with unwanted thoughts and feelings. ? Biofeedback. This process trains you to manage your body's response (physiological response) through breathing techniques and relaxation methods. You will work with a therapist while machines are used to monitor your physical symptoms.  Stress management techniques. These include yoga, meditation, and exercise.  A mental health specialist can help determine which treatment is best for you. Some people see improvement with one type of therapy. However, other people require a combination of therapies. Follow these instructions at home:  Take over-the-counter and prescription medicines only as told by your health care provider.  Try to maintain a normal routine.  Try to anticipate stressful situations and allow extra time to manage them.  Practice any stress management or self-calming techniques as taught by your health care provider.  Do not punish yourself for setbacks or for not making progress.  Try to recognize your  accomplishments, even if they are small.  Keep all follow-up visits as told by your health care provider. This is important. Contact a health care provider if:  Your symptoms do not get better.  Your symptoms get worse.  You have signs of depression, such as: ? A persistently sad, cranky, or irritable mood. ? Loss of enjoyment in activities that used to bring you joy. ? Change in weight or eating. ? Changes in sleeping habits. ? Avoiding friends or family members. ? Loss of energy for normal tasks. ? Feelings of guilt or worthlessness. Get help right away if:  You have serious thoughts about hurting yourself or others. If you ever feel like you may hurt yourself or others, or have thoughts about taking your own life, get help right away. You can go to your nearest emergency department or call:  Your local emergency services (911 in the [place]  A suicide crisis helpline, such as the National Suicide Prevention Lifeline at 734-332-5357. This is open 24 hours a day.  Summary  Generalized anxiety disorder (GAD) is a mental health disorder that involves worry that is not triggered by a specific event.  People with GAD often worry excessively about many things in their lives, such as their health and family.  GAD may cause physical symptoms such as restlessness, trouble concentrating, sleep problems, frequent sweating, nausea, diarrhea, headaches, and trembling or muscle twitching.  A mental health specialist can help determine which treatment is best for you. Some people see improvement with one type of therapy. However, other people require a combination of therapies. This information is not intended to replace advice given to you by your health care provider. Make sure you discuss any questions you have with your health care provider. Document Released: 05/26/2012 Document Revised: 12/20/2015 Document Reviewed: 12/20/2015 Elsevier Interactive Patient Education  [employer].  Continue all medications as directed. One final refill on Alprazolam (Xanax) provided. Referral to Behavioral Health placed. Increase water intake, strive for at least 85 ounces/day.   Follow Mediterraenean diet Increase regular exercise.  Recommend at least 30 minutes daily, 5 days per week of walking, jogging, biking, swimming, YouTube/Pinterest workout videos. Please schedule complete physical in 4 weeks, fasting lab appt the week prior. Continue follow-up with Neurology as directed. WELCOME TO THE PRACTICE!

## 2017-12-24 NOTE — Assessment & Plan Note (Signed)
Re-started on Omeprazole 20mg  QD

## 2017-12-24 NOTE — Assessment & Plan Note (Signed)
Followed by Neurology Currently using Rizatriptan (Maxalt) 10mg  PRN

## 2017-12-24 NOTE — Progress Notes (Addendum)
Subjective:    Patient ID: Tanya [name], female    DOB: 1980/09/15, 37 y.o.   MRN: [id number]  HPI:  Tanya [name] is here to establish as a new pt.  She is a pleasant 37 year old. PMH: Insomnia, Migraine with aura- followed by Neurology, Tension HA, and GAD Recent "acute issues"- She was seen in ED 11/16/2017 for migraine, CT reassuring, treated with fluids/ketorolac/compazine She was seen in ED 12/03/17 for nausea/diarrhea treated "GI cocktail"  She was recently started on Escitalopram (Lexapro) 10mg  and Alprazolam 0.2925md PRN approx 2 weeks. She reports mood is stable, denies thoughts of harming herself She reports eating a diet rich in fruits/vegetables/protien, however does not exercise due 2 hr commute, working 40 hrs/week, married with children ages 2313,6,2 She estimates to drink >65 oz water/day She denies tobacco/vape use and seldom drinks ETOH She reports insomnia, has been using Zolpidem 10mg  nightly consistently the last 2 years, but would like to wean off soon She is also interested in referral to Behavioral Health to address GAD  Patient Care Team    Relationship Specialty Notifications Start End  Julaine Fusianford, Katy D, NP PCP - General Family Medicine  12/10/17     Patient Active Problem List   Diagnosis Date Noted  . Migraine with aura and without status migrainosus, not intractable 02/01/2017  . S/P cesarean section 08/07/2015  . Placenta previa antepartum 08/03/2015  . TACHYCARDIA 06/07/2008  . DYSPNEA 06/07/2008     Past Medical History:  Diagnosis Date  . ADHD (attention deficit hyperactivity disorder)   . Anemia    postpartum  . Anxiety   . GERD (gastroesophageal reflux disease)   . History of chicken pox   . History of pyelonephritis    as child  . IBS (irritable bowel syndrome)   . Infection    UTI  . Interstitial cystitis   . Migraines   . Seasonal allergies   . Tachycardia   . Tilted uterus      Past Surgical History:  Procedure Laterality Date  .  CESAREAN SECTION N/A 08/07/2015   Procedure: CESAREAN SECTION;  Surgeon: Marcelle OverlieMichelle Grewal, MD;  [facility];  Service: Obstetrics;  Laterality: N/A;  . CESAREAN SECTION    . dilate and curettage  2016  . TUBAL LIGATION    . Tubiligation       Family History  Problem Relation Age of Onset  . Hypertension Mother   . Hypothyroidism Mother   . Thyroid disease Mother   . Other Mother        benign brain tumor  . Depression Mother   . Hyperlipidemia Mother   . Cancer Maternal Grandfather        breast  . Heart attack Maternal Grandfather   . Hyperlipidemia Maternal Grandfather   . Hypertension Maternal Grandfather   . Lupus Paternal Grandmother   . Diabetes Paternal Grandfather      Social History   Substance and Sexual Activity  Drug Use No     Social History   Substance and Sexual Activity  Alcohol Use Yes  . Alcohol/week: 2.0 standard drinks  . Types: 2 Glasses of wine per week   Comment: 1-2 per week     Social History   Tobacco Use  Smoking Status Never Smoker  Smokeless Tobacco Never Used     Outpatient Encounter Medications as of 12/24/2017  Medication Sig  . ALPRAZolam (XANAX) 0.25 MG tablet Take 1 tablet (0.25 mg total) by mouth 3 (  three) times daily as needed. No additional refills permitted  . escitalopram (LEXAPRO) 10 MG tablet Take 10 mg by mouth daily.  Marland Kitchen ibuprofen (ADVIL,MOTRIN) 600 MG tablet Take 1 tablet (600 mg total) by mouth every 6 (six) hours.  Marland Kitchen levonorgestrel (MIRENA, 52 MG,) 20 MCG/24HR IUD 1 each by Intrauterine route once.  . rizatriptan (MAXALT-MLT) 10 MG disintegrating tablet Take 1 tab at the onset of migraine.May repeat in 2 hours if needed. Not to exceed 2 tabs/24 hours  . zolpidem (AMBIEN) 10 MG tablet Take 10 mg by mouth at bedtime as needed for sleep.  . [DISCONTINUED] ALPRAZolam (XANAX) 0.25 MG tablet Take 25 mg by mouth 3 (three) times daily as needed.   Marland Kitchen omeprazole (PRILOSEC) 20 MG capsule Take 1 capsule (20  mg total) by mouth daily.  . [DISCONTINUED] cyclobenzaprine (FLEXERIL) 5 MG tablet TK 1 T PO QHS PRF SPASM  . [DISCONTINUED] ondansetron (ZOFRAN-ODT) 4 MG disintegrating tablet Take 1 tablet (4 mg total) by mouth every 8 (eight) hours as needed for nausea or vomiting.  . [DISCONTINUED] promethazine (PHENERGAN) 25 MG tablet Take 1 tablet (25 mg total) by mouth every 6 (six) hours as needed for nausea or vomiting.  . [DISCONTINUED] zolpidem (AMBIEN) 5 MG tablet Take 5 mg at bedtime as needed by mouth for sleep.   No facility-administered encounter medications on file as of 12/24/2017.     Allergies: Compazine [prochlorperazine edisylate]  Body mass index is 30.28 kg/m.  Blood pressure 92/60, pulse 68, temperature 98.7 F (37.1 C), temperature source Oral, height 5\' 7"  (1.702 m), weight 193 lb 4.8 oz (87.7 kg), last menstrual period 12/18/2017, SpO2 97 %.  Review of Systems  Constitutional: Positive for fatigue. Negative for activity change, appetite change, chills, diaphoresis, fever and unexpected weight change.  Eyes: Negative for visual disturbance.  Respiratory: Negative for cough, chest tightness, shortness of breath, wheezing and stridor.   Cardiovascular: Negative for chest pain, palpitations and leg swelling.  Gastrointestinal: Negative for abdominal distention, anal bleeding, blood in stool, constipation, diarrhea, nausea and vomiting.  Endocrine: Negative for cold intolerance, heat intolerance, polydipsia, polyphagia and polyuria.  Genitourinary: Negative for difficulty urinating and flank pain.  Musculoskeletal: Negative for arthralgias and myalgias.  Neurological: Positive for headaches. Negative for dizziness and weakness.  Hematological: Does not bruise/bleed easily.  Psychiatric/Behavioral: Positive for sleep disturbance. Negative for behavioral problems, confusion, decreased concentration, dysphoric mood, hallucinations, self-injury and suicidal ideas. The patient is  nervous/anxious. The patient is not hyperactive.        Objective:   Physical Exam  Constitutional: She is oriented to person, place, and time. She appears well-developed and well-nourished. No distress.  HENT:  Head: Normocephalic and atraumatic.  Right Ear: External ear normal.  Left Ear: External ear normal.  Nose: Nose normal.  Mouth/Throat: Oropharynx is clear and moist.  Cardiovascular: Normal rate, regular rhythm, normal heart sounds and intact distal pulses.  No murmur heard. Pulmonary/Chest: Breath sounds normal. No stridor. No respiratory distress. She has no wheezes. She has no rales. She exhibits no tenderness.  Neurological: She is alert and oriented to person, place, and time.  Skin: Skin is warm and dry. Capillary refill takes less than 2 seconds. No rash noted. She is not diaphoretic. No erythema. No pallor.  Psychiatric: She has a normal mood and affect. Her behavior is normal. Judgment and thought content normal.  Nursing note and vitals reviewed.     Assessment & Plan:   1. Healthcare maintenance   2.  Family history of diabetes mellitus in mother   3. Migraine with aura and without status migrainosus, not intractable   4. Gastroesophageal reflux disease, esophagitis presence not specified   5. GAD (generalized anxiety disorder)     Healthcare maintenance Continue all medications as directed. One final refill on Alprazolam (Xanax) provided. Referral to Behavioral Health placed. Increase water intake, strive for at least 85 ounces/day.   Follow Mediterraenean diet Increase regular exercise.  Recommend at least 30 minutes daily, 5 days per week of walking, jogging, biking, swimming, YouTube/Pinterest workout videos. Please schedule complete physical in 4 weeks, fasting lab appt the week prior. Continue follow-up with Neurology as directed.  Migraine with aura and without status migrainosus, not intractable Followed by Neurology Currently using Rizatriptan  (Maxalt) 10mg  PRN  GERD (gastroesophageal reflux disease) Re-started on Omeprazole 20mg  QD  GAD (generalized anxiety disorder) [place] Controlled Substance Database reviewed- regular refills on Zolpidem Two recent refills on alprazolam One refill only Alprazolam 0.25mg  given today Continue Escitalopram 10mg  QD Referral to Behavioral Health placed  Pt was in the office today for 45+ minutes, I spent >75% of  time  in face to face counseling of various medical concerns and in coordination of care  FOLLOW-UP:  Return in about 4 weeks (around 01/21/2018) for CPE, Fasting Labs.

## 2017-12-24 NOTE — Assessment & Plan Note (Signed)
[place] Controlled Substance Database reviewed- regular refills on Zolpidem Two recent refills on alprazolam One refill only Alprazolam 0.25mg  given today Continue Escitalopram 10mg  QD Referral to Behavioral Health placed

## 2017-12-25 ENCOUNTER — Encounter: Payer: Self-pay | Admitting: Adult Health

## 2017-12-31 NOTE — Progress Notes (Signed)
I reviewed note and agree with plan.   VIKRAM R. PENUMALLI, MD  Certified in Neurology, Neurophysiology and Neuroimaging  [facility] [address], [address] [place], [place] [address] [phone]   

## 2018-01-08 ENCOUNTER — Other Ambulatory Visit: Payer: Self-pay

## 2018-01-08 ENCOUNTER — Ambulatory Visit (HOSPITAL_COMMUNITY)
Admission: EM | Admit: 2018-01-08 | Discharge: 2018-01-08 | Disposition: A | Discharge status: Home / Self Care | Payer: BLUE CROSS/BLUE SHIELD | Attending: Family Medicine | Admitting: Family Medicine

## 2018-01-08 ENCOUNTER — Encounter (HOSPITAL_COMMUNITY): Payer: Self-pay

## 2018-01-08 DIAGNOSIS — J029 Acute pharyngitis, unspecified: Secondary | ICD-10-CM | POA: Diagnosis not present

## 2018-01-08 DIAGNOSIS — Z791 Long term (current) use of non-steroidal anti-inflammatories (NSAID): Secondary | ICD-10-CM | POA: Insufficient documentation

## 2018-01-08 DIAGNOSIS — Z79899 Other long term (current) drug therapy: Secondary | ICD-10-CM | POA: Diagnosis not present

## 2018-01-08 DIAGNOSIS — Z793 Long term (current) use of hormonal contraceptives: Secondary | ICD-10-CM | POA: Insufficient documentation

## 2018-01-08 DIAGNOSIS — F411 Generalized anxiety disorder: Secondary | ICD-10-CM | POA: Diagnosis not present

## 2018-01-08 DIAGNOSIS — K219 Gastro-esophageal reflux disease without esophagitis: Secondary | ICD-10-CM | POA: Diagnosis not present

## 2018-01-08 DIAGNOSIS — Z888 Allergy status to other drugs, medicaments and biological substances status: Secondary | ICD-10-CM | POA: Insufficient documentation

## 2018-01-08 DIAGNOSIS — F909 Attention-deficit hyperactivity disorder, unspecified type: Secondary | ICD-10-CM | POA: Diagnosis not present

## 2018-01-08 DIAGNOSIS — Z818 Family history of other mental and behavioral disorders: Secondary | ICD-10-CM | POA: Insufficient documentation

## 2018-01-08 LAB — POCT RAPID STREP A: STREPTOCOCCUS, GROUP A SCREEN (DIRECT): NEGATIVE

## 2018-01-08 MED ORDER — IPRATROPIUM BROMIDE 0.06 % NA SOLN: 2.0000 | Freq: Four times a day (QID) | NASAL | 0 refills | Status: DC

## 2018-01-08 MED ORDER — LIDOCAINE VISCOUS HCL 2 % MT SOLN: OROMUCOSAL | 0 refills | Status: DC

## 2018-01-08 MED ORDER — FLUTICASONE PROPIONATE 50 MCG/ACT NA SUSP: 2.0000 | Freq: Every day | NASAL | 0 refills | Status: DC

## 2018-01-08 NOTE — ED Triage Notes (Signed)
Pt states she has a sore throat x 2 days. 

## 2018-01-08 NOTE — Discharge Instructions (Addendum)
Rapid strep negative. Symptoms are most likely due to viral illness/ drainage down your throat. Start lidocaine for sore throat, do not eat or drink for the next 40 mins after use as it can stunt your gag reflex. Flonase, atrovent for nasal congestion/drainage. You can use over the counter nasal saline rinse such as neti pot for nasal congestion. Monitor for any worsening of symptoms, swelling of the throat, trouble breathing, trouble swallowing, leaning forward to breath, drooling, go to the emergency department for further evaluation needed. ° °For sore throat/cough try using a honey-based tea. Use 3 teaspoons of honey with juice squeezed from half lemon. Place shaved pieces of ginger into 1/2-1 cup of water and warm over stove top. Then mix the ingredients and repeat every 4 hours as needed. °

## 2018-01-08 NOTE — ED Provider Notes (Signed)
[facility]    CSN: [id number] Arrival date & time: 01/08/18  1446     History   Chief Complaint Chief Complaint  Patient presents with  . Appointment    300  . Sore Throat    HPI Tanya [name] is a 37 y.o. female.   37 year old female comes in for evaluation of sore throat for the past 2 days.  No obvious cough, congestion, rhinorrhea.  Denies fever, chills, night sweats.  States T-max at home was 99.5.  Still eating and drinking though with painful swallowing.  Denies trismus, tripoding, drooling.  Positive sick contact at home. Has been taking ibuprofen.      Past Medical History:  Diagnosis Date  . ADHD (attention deficit hyperactivity disorder)   . Anemia    postpartum  . Anxiety   . GERD (gastroesophageal reflux disease)   . History of chicken pox   . History of pyelonephritis    as child  . IBS (irritable bowel syndrome)   . Infection    UTI  . Interstitial cystitis   . Migraines   . Seasonal allergies   . Tachycardia   . Tilted uterus     Patient Active Problem List   Diagnosis Date Noted  . Healthcare maintenance 12/24/2017  . GERD (gastroesophageal reflux disease) 12/24/2017  . Anemia 12/24/2017  . GAD (generalized anxiety disorder) 12/24/2017  . Migraine with aura and without status migrainosus, not intractable 02/01/2017  . S/P cesarean section 08/07/2015  . Placenta previa antepartum 08/03/2015  . TACHYCARDIA 06/07/2008  . DYSPNEA 06/07/2008    Past Surgical History:  Procedure Laterality Date  . CESAREAN SECTION N/A 08/07/2015   Procedure: CESAREAN SECTION;  Surgeon: Marcelle Overlie, MD;  Location: [facility];  Service: Obstetrics;  Laterality: N/A;  . CESAREAN SECTION    . dilate and curettage  2016  . TUBAL LIGATION    . Tubiligation      OB History    Gravida  5   Para  3   Term  2   Preterm  1   AB  2   Living  3     SAB  2   TAB      Ectopic      Multiple  0   Live Births  3             Home Medications    Prior to Admission medications   Medication Sig Start Date End Date Taking? Authorizing Provider  ALPRAZolam (XANAX) 0.25 MG tablet Take 1 tablet (0.25 mg total) by mouth 3 (three) times daily as needed. No additional refills permitted 12/24/17   Danford, Orpha Bur D, NP  escitalopram (LEXAPRO) 10 MG tablet Take 10 mg by mouth daily. 12/04/17   [provider]  fluticasone (FLONASE) 50 MCG/ACT nasal spray Place 2 sprays into both nostrils daily. 01/08/18   Cathie Hoops, Marthena Whitmyer V, PA-C  ibuprofen (ADVIL,MOTRIN) 600 MG tablet Take 1 tablet (600 mg total) by mouth every 6 (six) hours. 08/11/15   Julio Sicks, NP  ipratropium (ATROVENT) 0.06 % nasal spray Place 2 sprays into both nostrils 4 (four) times daily. 01/08/18   Cathie Hoops, Kristiane Morsch V, PA-C  levonorgestrel (MIRENA, 52 MG,) 20 MCG/24HR IUD 1 each by Intrauterine route once.    [provider]  lidocaine (XYLOCAINE) 2 % solution 5-15 mL gurgle as needed 01/08/18   Cathie Hoops, Craven Crean V, PA-C  omeprazole (PRILOSEC) 20 MG capsule Take 1 capsule (20 mg total) by mouth daily.  12/24/17   Danford, Orpha BurKaty D, NP  rizatriptan (MAXALT-MLT) 10 MG disintegrating tablet Take 1 tab at the onset of migraine.May repeat in 2 hours if needed. Not to exceed 2 tabs/24 hours 12/10/17   Butch PennyMillikan, Megan, NP  zolpidem (AMBIEN) 10 MG tablet Take 10 mg by mouth at bedtime as needed for sleep.    [provider]    Family History Family History  Problem Relation Age of Onset  . Hypertension Mother   . Hypothyroidism Mother   . Thyroid disease Mother   . Other Mother        benign brain tumor  . Depression Mother   . Hyperlipidemia Mother   . Cancer Maternal Grandfather        breast  . Heart attack Maternal Grandfather   . Hyperlipidemia Maternal Grandfather   . Hypertension Maternal Grandfather   . Lupus Paternal Grandmother   . Diabetes Paternal Grandfather     Social History Social History   Tobacco Use  . Smoking status: Never Smoker  .  Smokeless tobacco: Never Used  Substance Use Topics  . Alcohol use: Yes    Alcohol/week: 2.0 standard drinks    Types: 2 Glasses of wine per week    Comment: 1-2 per week  . Drug use: No     Allergies   Compazine [prochlorperazine edisylate]   Review of Systems Review of Systems  Reason unable to perform ROS: See HPI as above.     Physical Exam Triage Vital Signs ED Triage Vitals  Enc Vitals Group     BP 01/08/18 1517 119/78     Pulse Rate 01/08/18 1517 98     Resp 01/08/18 1517 18     Temp 01/08/18 1517 98.5 F (36.9 C)     Temp Source 01/08/18 1517 Oral     SpO2 01/08/18 1517 98 %     Weight 01/08/18 1515 190 lb (86.2 kg)     Height --      Head Circumference --      Peak Flow --      Pain Score 01/08/18 1515 5     Pain Loc --      Pain Edu? --      Excl. in GC? --    No data found.  Updated Vital Signs BP 119/78 (BP Location: Right Arm)   Pulse 98   Temp 98.5 F (36.9 C) (Oral)   Resp 18   Wt 190 lb (86.2 kg)   LMP 12/28/2017   SpO2 98%   BMI 29.76 kg/m    Physical Exam  Constitutional: She is oriented to person, place, and time. She appears well-developed and well-nourished. No distress.  HENT:  Head: Normocephalic and atraumatic.  Right Ear: Tympanic membrane, external ear and ear canal normal. Tympanic membrane is not erythematous and not bulging.  Left Ear: Tympanic membrane, external ear and ear canal normal. Tympanic membrane is not erythematous and not bulging.  Nose: Nose normal. Right sinus exhibits no maxillary sinus tenderness and no frontal sinus tenderness. Left sinus exhibits no maxillary sinus tenderness and no frontal sinus tenderness.  Mouth/Throat: Uvula is midline, oropharynx is clear and moist and mucous membranes are normal. Tonsils are 1+ on the right. Tonsils are 1+ on the left. No tonsillar exudate.  Eyes: Pupils are equal, round, and reactive to light. Conjunctivae are normal.  Neck: Normal range of motion. Neck supple.    Cardiovascular: Normal rate, regular rhythm and normal heart sounds. Exam reveals no  gallop and no friction rub.  No murmur heard. Pulmonary/Chest: Effort normal and breath sounds normal. She has no decreased breath sounds. She has no wheezes. She has no rhonchi. She has no rales.  Lymphadenopathy:    She has no cervical adenopathy.  Neurological: She is alert and oriented to person, place, and time.  Skin: Skin is warm and dry.  Psychiatric: She has a normal mood and affect. Her behavior is normal. Judgment normal.     UC Treatments / Results  Labs (all labs ordered are listed, but only abnormal results are displayed) Labs Reviewed  CULTURE, GROUP A STREP [facility])  POCT RAPID STREP A  POCT RAPID STREP A    EKG None  Radiology No results found.  Procedures Procedures (including critical care time)  Medications Ordered in UC Medications - No data to display  Initial Impression / Assessment and Plan / UC Course  I have reviewed the triage vital signs and the nursing notes.  Pertinent labs & imaging results that were available during my care of the patient were reviewed by me and considered in my medical decision making (see chart for details).    Rapid strep negative. Patient is nontoxic in appearance. Symptomatic treatment as needed. Return precautions given.   Final Clinical Impressions(s) / UC Diagnoses   Final diagnoses:  Pharyngitis, unspecified etiology    ED Prescriptions    Medication Sig Dispense Auth. Provider   lidocaine (XYLOCAINE) 2 % solution 5-15 mL gurgle as needed 150 mL Leeyah Heather V, PA-C   fluticasone (FLONASE) 50 MCG/ACT nasal spray Place 2 sprays into both nostrils daily. 1 g Kiera Hussey V, PA-C   ipratropium (ATROVENT) 0.06 % nasal spray Place 2 sprays into both nostrils 4 (four) times daily. 15 mL Threasa Alpha, [place] 01/08/18 1702

## 2018-01-11 LAB — CULTURE, GROUP A STREP (THRC)

## 2018-01-13 LAB — POCT RAPID STREP A: Streptococcus, Group A Screen (Direct): NEGATIVE

## 2018-01-16 ENCOUNTER — Other Ambulatory Visit (INDEPENDENT_AMBULATORY_CARE_PROVIDER_SITE_OTHER): Payer: BLUE CROSS/BLUE SHIELD

## 2018-01-16 DIAGNOSIS — Z0000 Encounter for general adult medical examination without abnormal findings: Secondary | ICD-10-CM | POA: Diagnosis not present

## 2018-01-16 DIAGNOSIS — Z01419 Encounter for gynecological examination (general) (routine) without abnormal findings: Secondary | ICD-10-CM | POA: Diagnosis not present

## 2018-01-16 DIAGNOSIS — Z833 Family history of diabetes mellitus: Secondary | ICD-10-CM

## 2018-01-16 DIAGNOSIS — Z1231 Encounter for screening mammogram for malignant neoplasm of breast: Secondary | ICD-10-CM | POA: Diagnosis not present

## 2018-01-16 DIAGNOSIS — Z6830 Body mass index (BMI) 30.0-30.9, adult: Secondary | ICD-10-CM | POA: Diagnosis not present

## 2018-01-17 LAB — CBC WITH DIFFERENTIAL/PLATELET
BASOS: 1 %
Basophils Absolute: 0 10*3/uL (ref 0.0–0.2)
EOS (ABSOLUTE): 0.1 10*3/uL (ref 0.0–0.4)
EOS: 1 %
HEMATOCRIT: 44 % (ref 34.0–46.6)
Hemoglobin: 14.4 g/dL (ref 11.1–15.9)
IMMATURE GRANULOCYTES: 0 %
Immature Grans (Abs): 0 10*3/uL (ref 0.0–0.1)
Lymphocytes Absolute: 1.6 10*3/uL (ref 0.7–3.1)
Lymphs: 25 %
MCH: 29 pg (ref 26.6–33.0)
MCHC: 32.7 g/dL (ref 31.5–35.7)
MCV: 89 fL (ref 79–97)
MONOCYTES: 7 %
MONOS ABS: 0.4 10*3/uL (ref 0.1–0.9)
NEUTROS PCT: 66 %
Neutrophils Absolute: 4.1 10*3/uL (ref 1.4–7.0)
Platelets: 254 10*3/uL (ref 150–450)
RBC: 4.97 x10E6/uL (ref 3.77–5.28)
RDW: 12 % — AB (ref 12.3–15.4)
WBC: 6.3 10*3/uL (ref 3.4–10.8)

## 2018-01-17 LAB — TSH: TSH: 2.52 u[IU]/mL (ref 0.450–4.500)

## 2018-01-17 LAB — HEMOGLOBIN A1C
ESTIMATED AVERAGE GLUCOSE: 100 mg/dL
HEMOGLOBIN A1C: 5.1 % (ref 4.8–5.6)

## 2018-01-17 LAB — COMPREHENSIVE METABOLIC PANEL
ALBUMIN: 4.4 g/dL (ref 3.5–5.5)
ALK PHOS: 65 IU/L (ref 39–117)
ALT: 8 IU/L (ref 0–32)
AST: 13 IU/L (ref 0–40)
Albumin/Globulin Ratio: 1.8 (ref 1.2–2.2)
BUN/Creatinine Ratio: 18 (ref 9–23)
BUN: 16 mg/dL (ref 6–20)
Bilirubin Total: 0.4 mg/dL (ref 0.0–1.2)
CALCIUM: 9.2 mg/dL (ref 8.7–10.2)
CO2: 24 mmol/L (ref 20–29)
CREATININE: 0.89 mg/dL (ref 0.57–1.00)
Chloride: 101 mmol/L (ref 96–106)
GFR calc Af Amer: 95 mL/min/{1.73_m2} (ref >59)
GFR, EST NON AFRICAN AMERICAN: 82 mL/min/{1.73_m2} (ref >59)
GLOBULIN, TOTAL: 2.5 g/dL (ref 1.5–4.5)
GLUCOSE: 84 mg/dL (ref 65–99)
Potassium: 4.4 mmol/L (ref 3.5–5.2)
SODIUM: 138 mmol/L (ref 134–144)
Total Protein: 6.9 g/dL (ref 6.0–8.5)

## 2018-01-17 LAB — LIPID PANEL
CHOL/HDL RATIO: 3.2 ratio (ref 0.0–4.4)
Cholesterol, Total: 181 mg/dL (ref 100–199)
HDL: 57 mg/dL (ref >39)
LDL Calculated: 109 mg/dL — ABNORMAL HIGH (ref 0–99)
Triglycerides: 73 mg/dL (ref 0–149)
VLDL Cholesterol Cal: 15 mg/dL (ref 5–40)

## 2018-01-20 ENCOUNTER — Ambulatory Visit: Payer: BLUE CROSS/BLUE SHIELD | Admitting: Adult Health

## 2018-01-20 ENCOUNTER — Other Ambulatory Visit: Payer: Self-pay | Admitting: Obstetrics and Gynecology

## 2018-01-20 DIAGNOSIS — R928 Other abnormal and inconclusive findings on diagnostic imaging of breast: Secondary | ICD-10-CM

## 2018-01-21 NOTE — Progress Notes (Signed)
Subjective:    Patient ID: Tanya [name], female    DOB: 1980/07/23, 37 y.o.   MRN: [id number]  HPI:  Tanya [name] presents for CPE She has increased water intake, estimates to drink >80 oz day and has slowly been incorporating regular exercise into her work week-great! She reports needing less PRN Alprazolam and feels that the daily escitalopram 10mg  is helping manage GAD She recently had abnormal Mammogram but [facility]- [facility], advised to continue with annual mammograms She denies acute issues/complaints currently   Reviewed recent labs LDL-109 A1c-5.1  Healthcare Maintenance: PAP-UTD, last  Immunizations-UTD  Patient Care Team    Relationship Specialty Notifications Start End  Julaine Fusi, NP PCP - General Family Medicine  12/10/17   Suanne Marker, MD Consulting Physician Neurology  12/24/17   Candice Camp, MD Consulting Physician Obstetrics and Gynecology  12/24/17   Orlinda Blalock, MD Referring Physician Urology  12/24/17     Patient Active Problem List   Diagnosis Date Noted  . BMI 31.0-31.9,adult 01/22/2018  . Healthcare maintenance 12/24/2017  . GERD (gastroesophageal reflux disease) 12/24/2017  . Anemia 12/24/2017  . GAD (generalized anxiety disorder) 12/24/2017  . Migraine with aura and without status migrainosus, not intractable 02/01/2017  . S/P cesarean section 08/07/2015  . Placenta previa antepartum 08/03/2015  . TACHYCARDIA 06/07/2008  . DYSPNEA 06/07/2008     Past Medical History:  Diagnosis Date  . ADHD (attention deficit hyperactivity disorder)   . Anemia    postpartum  . Anxiety   . GERD (gastroesophageal reflux disease)   . History of chicken pox   . History of pyelonephritis    as child  . IBS (irritable bowel syndrome)   . Infection    UTI  . Interstitial cystitis   . Migraines   . Seasonal allergies   . Tachycardia   . Tilted uterus      Past Surgical History:  Procedure Laterality Date  . CESAREAN  SECTION N/A 08/07/2015   Procedure: CESAREAN SECTION;  Surgeon: Marcelle Overlie, MD;  Location: [facility];  Service: Obstetrics;  Laterality: N/A;  . CESAREAN SECTION    . dilate and curettage  2016  . TUBAL LIGATION    . Tubiligation       Family History  Problem Relation Age of Onset  . Hypertension Mother   . Hypothyroidism Mother   . Thyroid disease Mother   . Other Mother        benign brain tumor  . Depression Mother   . Hyperlipidemia Mother   . Cancer Maternal Grandfather        breast  . Heart attack Maternal Grandfather   . Hyperlipidemia Maternal Grandfather   . Hypertension Maternal Grandfather   . Breast cancer Maternal Grandfather   . Lupus Paternal Grandmother   . Diabetes Paternal Grandfather      Social History   Substance and Sexual Activity  Drug Use No     Social History   Substance and Sexual Activity  Alcohol Use Yes  . Alcohol/week: 2.0 standard drinks  . Types: 2 Glasses of wine per week   Comment: 1-2 per week     Social History   Tobacco Use  Smoking Status Never Smoker  Smokeless Tobacco Never Used     Outpatient Encounter Medications as of 01/22/2018  Medication Sig  . ALPRAZolam (XANAX) 0.25 MG tablet Take 1 tablet (0.25 mg total) by mouth 3 (three) times daily as  needed. No additional refills permitted  . escitalopram (LEXAPRO) 10 MG tablet Take 10 mg by mouth daily.  . fluticasone (FLONASE) 50 MCG/ACT nasal spray Place 2 sprays into both nostrils daily.  Marland Kitchen. ibuprofen (ADVIL,MOTRIN) 600 MG tablet Take 1 tablet (600 mg total) by mouth every 6 (six) hours.  Marland Kitchen. levonorgestrel (MIRENA, 52 MG,) 20 MCG/24HR IUD 1 each by Intrauterine route once.  Marland Kitchen. omeprazole (PRILOSEC) 20 MG capsule Take 1 capsule (20 mg total) by mouth daily.  . rizatriptan (MAXALT-MLT) 10 MG disintegrating tablet Take 1 tab at the onset of migraine.May repeat in 2 hours if needed. Not to exceed 2 tabs/24 hours  . zolpidem (AMBIEN) 10 MG tablet Take 10 mg  by mouth at bedtime as needed for sleep.  . [DISCONTINUED] ipratropium (ATROVENT) 0.06 % nasal spray Place 2 sprays into both nostrils 4 (four) times daily.  . [DISCONTINUED] lidocaine (XYLOCAINE) 2 % solution 5-15 mL gurgle as needed   No facility-administered encounter medications on file as of 01/22/2018.    Allergies: Compazine [prochlorperazine edisylate]  Body mass index is 31.01 kg/m.  Blood pressure 95/62, pulse 82, temperature 98.5 F (36.9 C), temperature source Oral, height 5\' 7"  (1.702 m), weight 198 lb (89.8 kg), last menstrual period 12/28/2017, SpO2 97 %.  Review of Systems  Constitutional: Positive for fatigue. Negative for activity change, appetite change, chills, diaphoresis, fever and unexpected weight change.  HENT: Negative for congestion.   Eyes: Negative for visual disturbance.  Respiratory: Negative for cough, chest tightness, shortness of breath, wheezing and stridor.   Cardiovascular: Negative for chest pain, palpitations and leg swelling.  Gastrointestinal: Negative for abdominal distention, abdominal pain, blood in stool, constipation, diarrhea, nausea and vomiting.  Endocrine: Negative for cold intolerance, heat intolerance, polydipsia, polyphagia and polyuria.  Genitourinary: Negative for difficulty urinating, dysuria and flank pain.  Musculoskeletal: Negative for arthralgias, back pain, gait problem, joint swelling, myalgias, neck pain and neck stiffness.  Skin: Negative for color change, pallor, rash and wound.  Neurological: Positive for headaches. Negative for dizziness.  Hematological: Bruises/bleeds easily.  Psychiatric/Behavioral: Negative for behavioral problems, confusion, decreased concentration, dysphoric mood, hallucinations, self-injury, sleep disturbance and suicidal ideas. The patient is nervous/anxious. The patient is not hyperactive.        Objective:   Physical Exam  Constitutional: She is oriented to person, place, and time. She  appears well-developed and well-nourished. No distress.  HENT:  Head: Normocephalic and atraumatic.  Right Ear: External ear normal. Tympanic membrane is not erythematous and not bulging. No decreased hearing is noted.  Left Ear: External ear normal. Tympanic membrane is not erythematous and not bulging. No decreased hearing is noted.  Nose: Nose normal. No mucosal edema or rhinorrhea. Right sinus exhibits no maxillary sinus tenderness and no frontal sinus tenderness. Left sinus exhibits no maxillary sinus tenderness and no frontal sinus tenderness.  Mouth/Throat: Uvula is midline, oropharynx is clear and moist and mucous membranes are normal. No posterior oropharyngeal edema or posterior oropharyngeal erythema. Tonsils are 0 on the right. Tonsils are 0 on the left.  Eyes: Pupils are equal, round, and reactive to light. Conjunctivae and EOM are normal.  Neck: Normal range of motion. Neck supple.  Cardiovascular: Normal rate, regular rhythm, normal heart sounds and intact distal pulses.  No murmur heard. Pulmonary/Chest: Effort normal and breath sounds normal. No stridor. No respiratory distress. She has no wheezes. She has no rales. She exhibits no tenderness.  Abdominal: Soft. Bowel sounds are normal. She exhibits no distension and no  mass. There is no tenderness. There is no rebound and no guarding. No hernia.  Musculoskeletal: Normal range of motion. She exhibits no edema or tenderness.  Lymphadenopathy:    She has no cervical adenopathy.  Neurological: She is alert and oriented to person, place, and time. Coordination normal.  Skin: Skin is warm and dry. Capillary refill takes less than 2 seconds. Bruising noted. No rash noted. She is not diaphoretic. No erythema. No pallor.  Scattered bruising noted on upper/lower extremities from recent lab draw and her dogs jumping on her   Psychiatric: She has a normal mood and affect. Her behavior is normal. Judgment and thought content normal.       Assessment & Plan:   1. BMI 31.0-31.9,adult   2. Healthcare maintenance   3. GAD (generalized anxiety disorder)     Healthcare maintenance Overall your labs look very good, just reduce saturated fat intake to normalize LDL cholesterol. Continue all medications as directed and contact pharmacy when you need refills. Continue to drink plenty of water, follow Mediterranean diet, and increase regular exercise. Follow-up in 6 months, sooner if needed.  GAD (generalized anxiety disorder) Mood stable- escitalopram 10mg  QD She reports needing less PRN Alprazolam 0.25mg   BMI 31.0-31.9,adult Body mass index is 31.01 kg/m.  Current wt - 198 Encouraged to reduce saturated fat and increase regular exercise     FOLLOW-UP:  Return in about 6 months (around 07/24/2018) for Regular Follow Up, Anxiety, Insomnia, GERD.

## 2018-01-22 ENCOUNTER — Ambulatory Visit
Admission: RE | Admit: 2018-01-22 | Discharge: 2018-01-22 | Disposition: A | Discharge status: Home / Self Care | Payer: BLUE CROSS/BLUE SHIELD | Source: Ambulatory Visit | Attending: Obstetrics and Gynecology | Admitting: Obstetrics and Gynecology

## 2018-01-22 ENCOUNTER — Encounter: Payer: Self-pay | Admitting: Adult Health

## 2018-01-22 ENCOUNTER — Ambulatory Visit (INDEPENDENT_AMBULATORY_CARE_PROVIDER_SITE_OTHER): Payer: BLUE CROSS/BLUE SHIELD | Admitting: Adult Health

## 2018-01-22 VITALS — BP 95/62 | HR 82 | Temp 98.5°F | Ht 67.0 in | Wt 198.0 lb

## 2018-01-22 DIAGNOSIS — F411 Generalized anxiety disorder: Secondary | ICD-10-CM | POA: Diagnosis not present

## 2018-01-22 DIAGNOSIS — Z0000 Encounter for general adult medical examination without abnormal findings: Secondary | ICD-10-CM | POA: Diagnosis not present

## 2018-01-22 DIAGNOSIS — N6011 Diffuse cystic mastopathy of right breast: Secondary | ICD-10-CM | POA: Diagnosis not present

## 2018-01-22 DIAGNOSIS — R928 Other abnormal and inconclusive findings on diagnostic imaging of breast: Secondary | ICD-10-CM

## 2018-01-22 DIAGNOSIS — R922 Inconclusive mammogram: Secondary | ICD-10-CM | POA: Diagnosis not present

## 2018-01-22 DIAGNOSIS — Z6831 Body mass index (BMI) 31.0-31.9, adult: Secondary | ICD-10-CM | POA: Diagnosis not present

## 2018-01-22 NOTE — Assessment & Plan Note (Signed)
Mood stable- escitalopram 10mg  QD She reports needing less PRN Alprazolam 0.25mg 

## 2018-01-22 NOTE — Assessment & Plan Note (Signed)
Body mass index is 31.01 kg/m.  Current wt - 198 Encouraged to reduce saturated fat and increase regular exercise

## 2018-01-22 NOTE — Assessment & Plan Note (Signed)
Overall your labs look very good, just reduce saturated fat intake to normalize LDL cholesterol. Continue all medications as directed and contact pharmacy when you need refills. Continue to drink plenty of water, follow Mediterranean diet, and increase regular exercise. Follow-up in 6 months, sooner if needed.

## 2018-01-22 NOTE — Patient Instructions (Signed)
Preventive Care for Adults, Female  A healthy lifestyle and preventive care can promote health and wellness. Preventive health guidelines for women include the following key practices.   A routine yearly physical is a good way to check with your health care provider about your health and preventive screening. It is a chance to share any concerns and updates on your health and to receive a thorough exam.   Visit your dentist for a routine exam and preventive care every 6 months. Brush your teeth twice a day and floss once a day. Good oral hygiene prevents tooth decay and gum disease.   The frequency of eye exams is based on your age, health, family medical history, use of contact lenses, and other factors. Follow your health care provider's recommendations for frequency of eye exams.   Eat a healthy diet. Foods like vegetables, fruits, whole grains, low-fat dairy products, and lean protein foods contain the nutrients you need without too many calories. Decrease your intake of foods high in solid fats, added sugars, and salt. Eat the right amount of calories for you.Get information about a proper diet from your health care provider, if necessary.   Regular physical exercise is one of the most important things you can do for your health. Most adults should get at least 150 minutes of moderate-intensity exercise (any activity that increases your heart rate and causes you to sweat) each week. In addition, most adults need muscle-strengthening exercises on 2 or more days a week.   Maintain a healthy weight. The body mass index (BMI) is a screening tool to identify possible weight problems. It provides an estimate of body fat based on height and weight. Your health care provider can find your BMI, and can help you achieve or maintain a healthy weight.For adults 20 years and older:   - A BMI below 18.5 is considered underweight.   - A BMI of 18.5 to 24.9 is normal.   - A BMI of 25 to 29.9 is  considered overweight.   - A BMI of 30 and above is considered obese.   Maintain normal blood lipids and cholesterol levels by exercising and minimizing your intake of trans and saturated fats.  Eat a balanced diet with plenty of fruit and vegetables. Blood tests for lipids and cholesterol should begin at age 20 and be repeated every 5 years minimum.  If your lipid or cholesterol levels are high, you are over 40, or you are at high risk for heart disease, you may need your cholesterol levels checked more frequently.Ongoing high lipid and cholesterol levels should be treated with medicines if diet and exercise are not working.   If you smoke, find out from your health care provider how to quit. If you do not use tobacco, do not start.   Lung cancer screening is recommended for adults aged 55-80 years who are at high risk for developing lung cancer because of a history of smoking. A yearly low-dose CT scan of the lungs is recommended for people who have at least a 30-pack-year history of smoking and are a current smoker or have quit within the past 15 years. A pack year of smoking is smoking an average of 1 pack of cigarettes a day for 1 year (for example: 1 pack a day for 30 years or 2 packs a day for 15 years). Yearly screening should continue until the smoker has stopped smoking for at least 15 years. Yearly screening should be stopped for people who develop a   health problem that would prevent them from having lung cancer treatment.   If you are pregnant, do not drink alcohol. If you are breastfeeding, be very cautious about drinking alcohol. If you are not pregnant and choose to drink alcohol, do not have more than 1 drink per day. One drink is considered to be 12 ounces (355 mL) of beer, 5 ounces (148 mL) of wine, or 1.5 ounces (44 mL) of liquor.   Avoid use of street drugs. Do not share needles with anyone. Ask for help if you need support or instructions about stopping the use of  drugs.   High blood pressure causes heart disease and increases the risk of stroke. Your blood pressure should be checked at least yearly.  Ongoing high blood pressure should be treated with medicines if weight loss and exercise do not work.   If you are 69-55 years old, ask your health care provider if you should take aspirin to prevent strokes.   Diabetes screening involves taking a blood sample to check your fasting blood sugar level. This should be done once every 3 years, after age 38, if you are within normal weight and without risk factors for diabetes. Testing should be considered at a younger age or be carried out more frequently if you are overweight and have at least 1 risk factor for diabetes.   Breast cancer screening is essential preventive care for women. You should practice "breast self-awareness."  This means understanding the normal appearance and feel of your breasts and may include breast self-examination.  Any changes detected, no matter how small, should be reported to a health care provider.  Women in their 80s and 30s should have a clinical breast exam (CBE) by a health care provider as part of a regular health exam every 1 to 3 years.  After age 66, women should have a CBE every year.  Starting at age 1, women should consider having a mammogram (breast X-ray test) every year.  Women who have a family history of breast cancer should talk to their health care provider about genetic screening.  Women at a high risk of breast cancer should talk to their health care providers about having an MRI and a mammogram every year.   -Breast cancer gene (BRCA)-related cancer risk assessment is recommended for women who have family members with BRCA-related cancers. BRCA-related cancers include breast, ovarian, tubal, and peritoneal cancers. Having family members with these cancers may be associated with an increased risk for harmful changes (mutations) in the breast cancer genes BRCA1 and  BRCA2. Results of the assessment will determine the need for genetic counseling and BRCA1 and BRCA2 testing.   The Pap test is a screening test for cervical cancer. A Pap test can show cell changes on the cervix that might become cervical cancer if left untreated. A Pap test is a procedure in which cells are obtained and examined from the lower end of the uterus (cervix).   - Women should have a Pap test starting at age 57.   - Between ages 90 and 70, Pap tests should be repeated every 2 years.   - Beginning at age 63, you should have a Pap test every 3 years as long as the past 3 Pap tests have been normal.   - Some women have medical problems that increase the chance of getting cervical cancer. Talk to your health care provider about these problems. It is especially important to talk to your health care provider if a  new problem develops soon after your last Pap test. In these cases, your health care provider may recommend more frequent screening and Pap tests.   - The above recommendations are the same for women who have or have not gotten the vaccine for human papillomavirus (HPV).   - If you had a hysterectomy for a problem that was not cancer or a condition that could lead to cancer, then you no longer need Pap tests. Even if you no longer need a Pap test, a regular exam is a good idea to make sure no other problems are starting.   - If you are between ages 36 and 66 years, and you have had normal Pap tests going back 10 years, you no longer need Pap tests. Even if you no longer need a Pap test, a regular exam is a good idea to make sure no other problems are starting.   - If you have had past treatment for cervical cancer or a condition that could lead to cancer, you need Pap tests and screening for cancer for at least 20 years after your treatment.   - If Pap tests have been discontinued, risk factors (such as a new sexual partner) need to be reassessed to determine if screening should  be resumed.   - The HPV test is an additional test that may be used for cervical cancer screening. The HPV test looks for the virus that can cause the cell changes on the cervix. The cells collected during the Pap test can be tested for HPV. The HPV test could be used to screen women aged 70 years and older, and should be used in women of any age who have unclear Pap test results. After the age of 67, women should have HPV testing at the same frequency as a Pap test.   Colorectal cancer can be detected and often prevented. Most routine colorectal cancer screening begins at the age of 57 years and continues through age 26 years. However, your health care provider may recommend screening at an earlier age if you have risk factors for colon cancer. On a yearly basis, your health care provider may provide home test kits to check for hidden blood in the stool.  Use of a small camera at the end of a tube, to directly examine the colon (sigmoidoscopy or colonoscopy), can detect the earliest forms of colorectal cancer. Talk to your health care provider about this at age 23, when routine screening begins. Direct exam of the colon should be repeated every 5 -10 years through age 49 years, unless early forms of pre-cancerous polyps or small growths are found.   People who are at an increased risk for hepatitis B should be screened for this virus. You are considered at high risk for hepatitis B if:  -You were born in a country where hepatitis B occurs often. Talk with your health care provider about which countries are considered high risk.  - Your parents were born in a high-risk country and you have not received a shot to protect against hepatitis B (hepatitis B vaccine).  - You have HIV or AIDS.  - You use needles to inject street drugs.  - You live with, or have sex with, someone who has Hepatitis B.  - You get hemodialysis treatment.  - You take certain medicines for conditions like cancer, organ  transplantation, and autoimmune conditions.   Hepatitis C blood testing is recommended for all people born from [birth year] through 1965 and any individual  with known risks for hepatitis C.   Practice safe sex. Use condoms and avoid high-risk sexual practices to reduce the spread of sexually transmitted infections (STIs). STIs include gonorrhea, chlamydia, syphilis, trichomonas, herpes, HPV, and human immunodeficiency virus (HIV). Herpes, HIV, and HPV are viral illnesses that have no cure. They can result in disability, cancer, and death. Sexually active women aged 25 years and younger should be checked for chlamydia. Older women with new or multiple partners should also be tested for chlamydia. Testing for other STIs is recommended if you are sexually active and at increased risk.   Osteoporosis is a disease in which the bones lose minerals and strength with aging. This can result in serious bone fractures or breaks. The risk of osteoporosis can be identified using a bone density scan. Women ages 65 years and over and women at risk for fractures or osteoporosis should discuss screening with their health care providers. Ask your health care provider whether you should take a calcium supplement or vitamin D to There are also several preventive steps women can take to avoid osteoporosis and resulting fractures or to keep osteoporosis from worsening. -->Recommendations include:  Eat a balanced diet high in fruits, vegetables, calcium, and vitamins.  Get enough calcium. The recommended total intake of is 1,200 mg daily; for best absorption, if taking supplements, divide doses into 250-500 mg doses throughout the day. Of the two types of calcium, calcium carbonate is best absorbed when taken with food but calcium citrate can be taken on an empty stomach.  Get enough vitamin D. NAMS and the National Osteoporosis Foundation recommend at least 1,000 IU per day for women age 50 and over who are at risk of vitamin D  deficiency. Vitamin D deficiency can be caused by inadequate sun exposure (for example, those who live in northern latitudes).  Avoid alcohol and smoking. Heavy alcohol intake (more than 7 drinks per week) increases the risk of falls and hip fracture and women smokers tend to lose bone more rapidly and have lower bone mass than nonsmokers. Stopping smoking is one of the most important changes women can make to improve their health and decrease risk for disease.  Be physically active every day. Weight-bearing exercise (for example, fast walking, hiking, jogging, and weight training) may strengthen bones or slow the rate of bone loss that comes with aging. Balancing and muscle-strengthening exercises can reduce the risk of falling and fracture.  Consider therapeutic medications. Currently, several types of effective drugs are available. Healthcare providers can recommend the type most appropriate for each woman.  Eliminate environmental factors that may contribute to accidents. Falls cause nearly 90% of all osteoporotic fractures, so reducing this risk is an important bone-health strategy. Measures include ample lighting, removing obstructions to walking, using nonskid rugs on floors, and placing mats and/or grab bars in showers.  Be aware of medication side effects. Some common medicines make bones weaker. These include a type of steroid drug called glucocorticoids used for arthritis and asthma, some antiseizure drugs, certain sleeping pills, treatments for endometriosis, and some cancer drugs. An overactive thyroid gland or using too much thyroid hormone for an underactive thyroid can also be a problem. If you are taking these medicines, talk to your doctor about what you can do to help protect your bones.reduce the rate of osteoporosis.    Menopause can be associated with physical symptoms and risks. Hormone replacement therapy is available to decrease symptoms and risks. You should talk to your  health care provider   about whether hormone replacement therapy is right for you.   Use sunscreen. Apply sunscreen liberally and repeatedly throughout the day. You should seek shade when your shadow is shorter than you. Protect yourself by wearing long sleeves, pants, a wide-brimmed hat, and sunglasses year round, whenever you are outdoors.   Once a month, do a whole body skin exam, using a mirror to look at the skin on your back. Tell your health care provider of new moles, moles that have irregular borders, moles that are larger than a pencil eraser, or moles that have changed in shape or color.   -Stay current with required vaccines (immunizations).   Influenza vaccine. All adults should be immunized every year.  Tetanus, diphtheria, and acellular pertussis (Td, Tdap) vaccine. Pregnant women should receive 1 dose of Tdap vaccine during each pregnancy. The dose should be obtained regardless of the length of time since the last dose. Immunization is preferred during the 27th 36th week of gestation. An adult who has not previously received Tdap or who does not know her vaccine status should receive 1 dose of Tdap. This initial dose should be followed by tetanus and diphtheria toxoids (Td) booster doses every 10 years. Adults with an unknown or incomplete history of completing a 3-dose immunization series with Td-containing vaccines should begin or complete a primary immunization series including a Tdap dose. Adults should receive a Td booster every 10 years.  Varicella vaccine. An adult without evidence of immunity to varicella should receive 2 doses or a second dose if she has previously received 1 dose. Pregnant females who do not have evidence of immunity should receive the first dose after pregnancy. This first dose should be obtained before leaving the health care facility. The second dose should be obtained 4 8 weeks after the first dose.  Human papillomavirus (HPV) vaccine. Females aged 13 26  years who have not received the vaccine previously should obtain the 3-dose series. The vaccine is not recommended for use in pregnant females. However, pregnancy testing is not needed before receiving a dose. If a female is found to be pregnant after receiving a dose, no treatment is needed. In that case, the remaining doses should be delayed until after the pregnancy. Immunization is recommended for any person with an immunocompromised condition through the age of 26 years if she did not get any or all doses earlier. During the 3-dose series, the second dose should be obtained 4 8 weeks after the first dose. The third dose should be obtained 24 weeks after the first dose and 16 weeks after the second dose.  Zoster vaccine. One dose is recommended for adults aged 60 years or older unless certain conditions are present.  Measles, mumps, and rubella (MMR) vaccine. Adults born before 1957 generally are considered immune to measles and mumps. Adults born in 1957 or later should have 1 or more doses of MMR vaccine unless there is a contraindication to the vaccine or there is laboratory evidence of immunity to each of the three diseases. A routine second dose of MMR vaccine should be obtained at least 28 days after the first dose for students attending postsecondary schools, health care workers, or international travelers. People who received inactivated measles vaccine or an unknown type of measles vaccine during 1963 1967 should receive 2 doses of MMR vaccine. People who received inactivated mumps vaccine or an unknown type of mumps vaccine before 1979 and are at high risk for mumps infection should consider immunization with 2 doses of   MMR vaccine. For females of childbearing age, rubella immunity should be determined. If there is no evidence of immunity, females who are not pregnant should be vaccinated. If there is no evidence of immunity, females who are pregnant should delay immunization until after pregnancy.  Unvaccinated health care workers born before [birth year] who lack laboratory evidence of measles, mumps, or rubella immunity or laboratory confirmation of disease should consider measles and mumps immunization with 2 doses of MMR vaccine or rubella immunization with 1 dose of MMR vaccine.  Pneumococcal 13-valent conjugate (PCV13) vaccine. When indicated, a person who is uncertain of her immunization history and has no record of immunization should receive the PCV13 vaccine. An adult aged 54 years or older who has certain medical conditions and has not been previously immunized should receive 1 dose of PCV13 vaccine. This PCV13 should be followed with a dose of pneumococcal polysaccharide (PPSV23) vaccine. The PPSV23 vaccine dose should be obtained at least 8 weeks after the dose of PCV13 vaccine. An adult aged 58 years or older who has certain medical conditions and previously received 1 or more doses of PPSV23 vaccine should receive 1 dose of PCV13. The PCV13 vaccine dose should be obtained 1 or more years after the last PPSV23 vaccine dose.  Pneumococcal polysaccharide (PPSV23) vaccine. When PCV13 is also indicated, PCV13 should be obtained first. All adults aged 58 years and older should be immunized. An adult younger than age 65 years who has certain medical conditions should be immunized. Any person who resides in a nursing home or long-term care facility should be immunized. An adult smoker should be immunized. People with an immunocompromised condition and certain other conditions should receive both PCV13 and PPSV23 vaccines. People with human immunodeficiency virus (HIV) infection should be immunized as soon as possible after diagnosis. Immunization during chemotherapy or radiation therapy should be avoided. Routine use of PPSV23 vaccine is not recommended for American Indians, [place] Natives, or people younger than 65 years unless there are medical conditions that require PPSV23 vaccine. When indicated,  people who have unknown immunization and have no record of immunization should receive PPSV23 vaccine. One-time revaccination 5 years after the first dose of PPSV23 is recommended for people aged 70 64 years who have chronic kidney failure, nephrotic syndrome, asplenia, or immunocompromised conditions. People who received 1 2 doses of PPSV23 before age 32 years should receive another dose of PPSV23 vaccine at age 96 years or later if at least 5 years have passed since the previous dose. Doses of PPSV23 are not needed for people immunized with PPSV23 at or after age 55 years.  Meningococcal vaccine. Adults with asplenia or persistent complement component deficiencies should receive 2 doses of quadrivalent meningococcal conjugate (MenACWY-D) vaccine. The doses should be obtained at least 2 months apart. Microbiologists working with certain meningococcal bacteria, [employer] recruits, people at risk during an outbreak, and people who travel to or live in countries with a high rate of meningitis should be immunized. A first-year college student up through age 58 years who is living in a residence hall should receive a dose if she did not receive a dose on or after her 16th birthday. Adults who have certain high-risk conditions should receive one or more doses of vaccine.  Hepatitis A vaccine. Adults who wish to be protected from this disease, have certain high-risk conditions, work with hepatitis A-infected animals, work in hepatitis A research labs, or travel to or work in countries with a high rate of hepatitis A should be  immunized. Adults who were previously unvaccinated and who anticipate close contact with an international adoptee during the first 60 days after arrival in the [place] from a country with a high rate of hepatitis A should be immunized.  Hepatitis B vaccine.  Adults who wish to be protected from this disease, have certain high-risk conditions, may be exposed to blood or other infectious  body fluids, are household contacts or sex partners of hepatitis B positive people, are clients or workers in certain care facilities, or travel to or work in countries with a high rate of hepatitis B should be immunized.  Haemophilus influenzae type b (Hib) vaccine. A previously unvaccinated person with asplenia or sickle cell disease or having a scheduled splenectomy should receive 1 dose of Hib vaccine. Regardless of previous immunization, a recipient of a hematopoietic stem cell transplant should receive a 3-dose series 6 12 months after her successful transplant. Hib vaccine is not recommended for adults with HIV infection.  Preventive Services / Frequency Ages 6 to 39years  Blood pressure check.** / Every 1 to 2 years.  Lipid and cholesterol check.** / Every 5 years beginning at age 39.  Clinical breast exam.** / Every 3 years for women in their 61s and 62s.  BRCA-related cancer risk assessment.** / For women who have family members with a BRCA-related cancer (breast, ovarian, tubal, or peritoneal cancers).  Pap test.** / Every 2 years from ages 47 through 85. Every 3 years starting at age 34 through age 12 or 74 with a history of 3 consecutive normal Pap tests.  HPV screening.** / Every 3 years from ages 46 through ages 43 to 54 with a history of 3 consecutive normal Pap tests.  Hepatitis C blood test.** / For any individual with known risks for hepatitis C.  Skin self-exam. / Monthly.  Influenza vaccine. / Every year.  Tetanus, diphtheria, and acellular pertussis (Tdap, Td) vaccine.** / Consult your health care provider. Pregnant women should receive 1 dose of Tdap vaccine during each pregnancy. 1 dose of Td every 10 years.  Varicella vaccine.** / Consult your health care provider. Pregnant females who do not have evidence of immunity should receive the first dose after pregnancy.  HPV vaccine. / 3 doses over 6 months, if 64 and younger. The vaccine is not recommended for use in  pregnant females. However, pregnancy testing is not needed before receiving a dose.  Measles, mumps, rubella (MMR) vaccine.** / You need at least 1 dose of MMR if you were born in 1957 or later. You may also need a 2nd dose. For females of childbearing age, rubella immunity should be determined. If there is no evidence of immunity, females who are not pregnant should be vaccinated. If there is no evidence of immunity, females who are pregnant should delay immunization until after pregnancy.  Pneumococcal 13-valent conjugate (PCV13) vaccine.** / Consult your health care provider.  Pneumococcal polysaccharide (PPSV23) vaccine.** / 1 to 2 doses if you smoke cigarettes or if you have certain conditions.  Meningococcal vaccine.** / 1 dose if you are age 71 to 36 years and a Market researcher living in a residence hall, or have one of several medical conditions, you need to get vaccinated against meningococcal disease. You may also need additional booster doses.  Hepatitis A vaccine.** / Consult your health care provider.  Hepatitis B vaccine.** / Consult your health care provider.  Haemophilus influenzae type b (Hib) vaccine.** / Consult your health care provider.  Ages 55 to 64years  Blood pressure check.** / Every 1 to 2 years.  Lipid and cholesterol check.** / Every 5 years beginning at age 20 years.  Lung cancer screening. / Every year if you are aged 55 80 years and have a 30-pack-year history of smoking and currently smoke or have quit within the past 15 years. Yearly screening is stopped once you have quit smoking for at least 15 years or develop a health problem that would prevent you from having lung cancer treatment.  Clinical breast exam.** / Every year after age 40 years.  BRCA-related cancer risk assessment.** / For women who have family members with a BRCA-related cancer (breast, ovarian, tubal, or peritoneal cancers).  Mammogram.** / Every year beginning at age 40  years and continuing for as long as you are in good health. Consult with your health care provider.  Pap test.** / Every 3 years starting at age 30 years through age 65 or 70 years with a history of 3 consecutive normal Pap tests.  HPV screening.** / Every 3 years from ages 30 years through ages 65 to 70 years with a history of 3 consecutive normal Pap tests.  Fecal occult blood test (FOBT) of stool. / Every year beginning at age 50 years and continuing until age 75 years. You may not need to do this test if you get a colonoscopy every 10 years.  Flexible sigmoidoscopy or colonoscopy.** / Every 5 years for a flexible sigmoidoscopy or every 10 years for a colonoscopy beginning at age 50 years and continuing until age 75 years.  Hepatitis C blood test.** / For all people born from 1945 through 1965 and any individual with known risks for hepatitis C.  Skin self-exam. / Monthly.  Influenza vaccine. / Every year.  Tetanus, diphtheria, and acellular pertussis (Tdap/Td) vaccine.** / Consult your health care provider. Pregnant women should receive 1 dose of Tdap vaccine during each pregnancy. 1 dose of Td every 10 years.  Varicella vaccine.** / Consult your health care provider. Pregnant females who do not have evidence of immunity should receive the first dose after pregnancy.  Zoster vaccine.** / 1 dose for adults aged 60 years or older.  Measles, mumps, rubella (MMR) vaccine.** / You need at least 1 dose of MMR if you were born in 1957 or later. You may also need a 2nd dose. For females of childbearing age, rubella immunity should be determined. If there is no evidence of immunity, females who are not pregnant should be vaccinated. If there is no evidence of immunity, females who are pregnant should delay immunization until after pregnancy.  Pneumococcal 13-valent conjugate (PCV13) vaccine.** / Consult your health care provider.  Pneumococcal polysaccharide (PPSV23) vaccine.** / 1 to 2 doses if  you smoke cigarettes or if you have certain conditions.  Meningococcal vaccine.** / Consult your health care provider.  Hepatitis A vaccine.** / Consult your health care provider.  Hepatitis B vaccine.** / Consult your health care provider.  Haemophilus influenzae type b (Hib) vaccine.** / Consult your health care provider.  Ages 65 years and over  Blood pressure check.** / Every 1 to 2 years.  Lipid and cholesterol check.** / Every 5 years beginning at age 20 years.  Lung cancer screening. / Every year if you are aged 55 80 years and have a 30-pack-year history of smoking and currently smoke or have quit within the past 15 years. Yearly screening is stopped once you have quit smoking for at least 15 years or develop a health problem that   would prevent you from having lung cancer treatment.  Clinical breast exam.** / Every year after age 103 years.  BRCA-related cancer risk assessment.** / For women who have family members with a BRCA-related cancer (breast, ovarian, tubal, or peritoneal cancers).  Mammogram.** / Every year beginning at age 36 years and continuing for as long as you are in good health. Consult with your health care provider.  Pap test.** / Every 3 years starting at age 5 years through age 85 or 10 years with 3 consecutive normal Pap tests. Testing can be stopped between 65 and 70 years with 3 consecutive normal Pap tests and no abnormal Pap or HPV tests in the past 10 years.  HPV screening.** / Every 3 years from ages 93 years through ages 70 or 45 years with a history of 3 consecutive normal Pap tests. Testing can be stopped between 65 and 70 years with 3 consecutive normal Pap tests and no abnormal Pap or HPV tests in the past 10 years.  Fecal occult blood test (FOBT) of stool. / Every year beginning at age 8 years and continuing until age 45 years. You may not need to do this test if you get a colonoscopy every 10 years.  Flexible sigmoidoscopy or colonoscopy.** /  Every 5 years for a flexible sigmoidoscopy or every 10 years for a colonoscopy beginning at age 69 years and continuing until age 68 years.  Hepatitis C blood test.** / For all people born from [birth year] through 1966 and any individual with known risks for hepatitis C.  Osteoporosis screening.** / A one-time screening for women ages 7 years and over and women at risk for fractures or osteoporosis.  Skin self-exam. / Monthly.  Influenza vaccine. / Every year.  Tetanus, diphtheria, and acellular pertussis (Tdap/Td) vaccine.** / 1 dose of Td every 10 years.  Varicella vaccine.** / Consult your health care provider.  Zoster vaccine.** / 1 dose for adults aged 5 years or older.  Pneumococcal 13-valent conjugate (PCV13) vaccine.** / Consult your health care provider.  Pneumococcal polysaccharide (PPSV23) vaccine.** / 1 dose for all adults aged 74 years and older.  Meningococcal vaccine.** / Consult your health care provider.  Hepatitis A vaccine.** / Consult your health care provider.  Hepatitis B vaccine.** / Consult your health care provider.  Haemophilus influenzae type b (Hib) vaccine.** / Consult your health care provider. ** Family history and personal history of risk and conditions may change your health care provider's recommendations. Document Released: 03/27/2001 Document Revised: 11/19/2012  [facility] Patient Information 2014 [place], [place].   EXERCISE AND DIET:  We recommended that you start or continue a regular exercise program for good health. Regular exercise means any activity that makes your heart beat faster and makes you sweat.  We recommend exercising at least 30 minutes per day at least 3 days a week, preferably 5.  We also recommend a diet low in fat and sugar / carbohydrates.  Inactivity, poor dietary choices and obesity can cause diabetes, heart attack, stroke, and kidney damage, among others.     ALCOHOL AND SMOKING:  Women should limit their alcohol intake to no  more than 7 drinks/beers/glasses of wine (combined, not each!) per week. Moderation of alcohol intake to this level decreases your risk of breast cancer and liver damage.  ( And of course, no recreational drugs are part of a healthy lifestyle.)  Also, you should not be smoking at all or even being exposed to second hand smoke. Most people know smoking can  cause cancer, and various heart and lung diseases, but did you know it also contributes to weakening of your bones?  Aging of your skin?  Yellowing of your teeth and nails?   CALCIUM AND VITAMIN D:  Adequate intake of calcium and Vitamin D are recommended.  The recommendations for exact amounts of these supplements seem to change often, but generally speaking 600 mg of calcium (either carbonate or citrate) and 800 units of Vitamin D per day seems prudent. Certain women may benefit from higher intake of Vitamin D.  If you are among these women, your doctor will have told you during your visit.     PAP SMEARS:  Pap smears, to check for cervical cancer or precancers,  have traditionally been done yearly, although recent scientific advances have shown that most women can have pap smears less often.  However, every woman still should have a physical exam from her gynecologist or primary care physician every year. It will include a breast check, inspection of the vulva and vagina to check for abnormal growths or skin changes, a visual exam of the cervix, and then an exam to evaluate the size and shape of the uterus and ovaries.  And after 37 years of age, a rectal exam is indicated to check for rectal cancers. We will also provide age appropriate advice regarding health maintenance, like when you should have certain vaccines, screening for sexually transmitted diseases, bone density testing, colonoscopy, mammograms, etc.    MAMMOGRAMS:  All women over 39 years old should have a yearly mammogram. Many facilities now offer a "3D" mammogram, which may cost  around $50 extra out of pocket. If possible,  we recommend you accept the option to have the 3D mammogram performed.  It both reduces the number of women who will be called back for extra views which then turn out to be normal, and it is better than the routine mammogram at detecting truly abnormal areas.     COLONOSCOPY:  Colonoscopy to screen for colon cancer is recommended for all women at age 38.  We know, you hate the idea of the prep.  We agree, BUT, having colon cancer and not knowing it is worse!!  Colon cancer so often starts as a polyp that can be seen and removed at colonscopy, which can quite literally save your life!  And if your first colonoscopy is normal and you have no family history of colon cancer, most women don't have to have it again for 10 years.  Once every ten years, you can do something that may end up saving your life, right?  We will be happy to help you get it scheduled when you are ready.  Be sure to check your insurance coverage so you understand how much it will cost.  It may be covered as a preventative service at no cost, but you should check your particular policy.   Overall your labs look very good, just reduce saturated fat intake to normalize LDL cholesterol. Continue all medications as directed and contact pharmacy when you need refills. Continue to drink plenty of water, follow Mediterranean diet, and increase regular exercise. Follow-up in 6 months, sooner if needed. NICE TO SEE YOU!

## 2018-02-15 ENCOUNTER — Ambulatory Visit (INDEPENDENT_AMBULATORY_CARE_PROVIDER_SITE_OTHER): Payer: BLUE CROSS/BLUE SHIELD

## 2018-02-15 ENCOUNTER — Ambulatory Visit: Payer: BLUE CROSS/BLUE SHIELD | Admitting: Podiatry

## 2018-02-15 ENCOUNTER — Encounter: Payer: Self-pay | Admitting: Podiatry

## 2018-02-15 VITALS — BP 108/79 | HR 97

## 2018-02-15 DIAGNOSIS — M722 Plantar fascial fibromatosis: Secondary | ICD-10-CM

## 2018-02-15 DIAGNOSIS — M7750 Other enthesopathy of unspecified foot: Secondary | ICD-10-CM

## 2018-02-15 DIAGNOSIS — M7751 Other enthesopathy of right foot: Secondary | ICD-10-CM

## 2018-02-15 MED ORDER — MELOXICAM 7.5 MG PO TABS: 7.5000 mg | ORAL_TABLET | Freq: Every day | ORAL | 0 refills | Status: DC

## 2018-02-15 NOTE — Patient Instructions (Signed)
Posterior Tibial Tendon Tear Rehab Ask your health care provider which exercises are safe for you. Do exercises exactly as told by your health care provider and adjust them as directed. It is normal to feel mild stretching, pulling, tightness, or discomfort as you do these exercises, but you should stop right away if you feel sudden pain or your pain gets worse.Do not begin these exercises until told by your health care provider. Stretching and range of motion exercises These exercises warm up your muscles and joints and improve the movement and flexibility of your ankle. These exercises also help to relieve pain, numbness, and tingling. Exercise A: Gastroc and soleus stretch  1. Sit on the floor with your left / right leg extended. 2. Loop a belt or towel around ball of your left / right foot. The ball of your foot is on the walking surface, right under your toes. 3. Keep your left / right ankle and foot relaxed and keep your knee straight while you use the belt or towel to pull your foot and ankle toward you. You should feel a gentle stretch behind your calf or knee. 4. Hold this position for __________ seconds. Repeat __________ times. Complete this exercise __________ times a day. Exercise B: Dorsiflexion/plantar flexion  1. Sit with your left / right knee straight or bent. 2. Flex your left / right ankle to tilt the top of your foot toward your shin. 3. Hold this position for __________ seconds. 4. Point your toes downward to tilt the top of your foot away from your shin. 5. Hold this position for __________ seconds. Repeat __________ times with your knee straight and __________ times with your knee bent. Complete this exercise __________ times a day. Exercise C: Ankle plantar flexion, passive  1. Sit with your left / right leg crossed over your opposite knee. 2. Use your opposite hand to pull the top of your foot and toes toward you. You should feel a gentle stretch on the top of your  foot and ankle. 3. Hold this position for __________ seconds. Repeat __________ times. Complete this exercise __________ times a day. Exercise D: Ankle eversion  1. Sit with your left / right ankle crossed over your opposite knee. 2. Grip your left / right foot with your opposite hand, with your thumb on the top of your foot and with your fingers on the bottom of your foot. 3. Gently push your foot downward with a slight rotation so the smallest toes rise slightly toward the ceiling. You should feel a gentle stretch on the inside of your ankle. 4. Hold this stretch for __________ seconds. Repeat __________ times. Complete this exercise __________ times a day. Exercise E: Ankle inversion  1. Sit with your left / right ankle crossed over your opposite knee. 2. Hold your left / right foot with your opposite hand, with your thumb on the bottom of your foot and your fingers on the top of your foot. 3. Gently pull your foot. Your smallest toe should come toward you, and your thumb should be pushing against the ball of your foot. You should feel a gentle stretch on the outside of your ankle. 4. Hold the stretch for __________ seconds. Repeat __________ times. Complete this exercise __________ times a day. Exercise F: Ankle alphabet  1. Sit with your left / right leg supported at the lower leg. ? Do not rest your foot on anything. ? Make sure your foot has room to move freely. 2. Think of your   left / right foot as a paintbrush, and move your foot to trace each letter of the alphabet in the air. Keep your hip and knee still while you trace. 3. Trace every letter from A to Z. Repeat __________ times. Complete this exercise __________ times a day. Strengthening exercises These exercises build strength and endurance in your lower leg. Endurance is the ability to use your muscles for a long time, even after they get tired. Exercise G: Dorsiflexors  1. Secure a rubber exercise band or tube to an  object that will not move if it is pulled on, such as a table leg. 2. Secure the other end of the band around your left / right foot. 3. Sit on the floor, facing the object with your left / right leg extended. The band or tube should be slightly tense when your foot is relaxed. 4. Slowly flex your left / right ankle and toes to bring your foot toward you. 5. Hold this position for __________ seconds. 6. Let the band or tube slowly pull your foot back to the starting position. Repeat __________ times. Complete this exercise __________ times a day. Exercise H: Plantar flexors  1. Sit on the floor with your left / right leg extended. 2. Loop a rubber exercise band or tube around the ball of your __________ foot. The ball of your foot is on the walking surface, right under your toes. The band or tube should be slightly tense when your foot is relaxed. 3. Slowly point your toes downward, pushing them away from you. 4. Hold this position for __________ seconds. 5. Let the band or tube slowly pull your foot back to the starting position. Repeat __________ times. Complete this exercise __________ times a day. Exercise I: Towel curls  1. Sit in a chair on a non-carpeted surface, and put your feet on the floor. 2. Place a towel in front of your feet. If told by your health care provider, add __________ to the end of the towel. 3. Keeping your heel on the floor, put your left / right foot on the towel. 4. Pull the towel toward you by grabbing the towel with your toes and curling them under. Keep your heel on the floor. Repeat __________ times. Complete this exercise __________ times a day. This information is not intended to replace advice given to you by your health care provider. Make sure you discuss any questions you have with your health care provider. Document Released: 01/29/2005 Document Revised: 10/06/2015 Document Reviewed: 01/23/2015 Elsevier Interactive Patient Education  2019 [employer]   

## 2018-02-17 NOTE — Addendum Note (Signed)
Addended by: Hadley Pen R on: 02/17/2018 08:05 AM   Modules accepted: Orders

## 2018-02-17 NOTE — Progress Notes (Signed)
Subjective:   Patient ID: Tanya [name], female   DOB: 38 y.o.   MRN: [id number]   HPI 38 year old female presents the office today for concerns of right ankle pain which is been ongoing for last couple of weeks.  She states this is been a chronic issue for the last several years since 2014 but she gets a flareup about every 6 months.  She previously was seen by Dr. Elijah Birkom where she had steroid injection performed as well as she is wearing a brace and a night splint.  This helped some.  She states they did try to get orthotics but due to cost she did not get them at the time.  She is also worn a compression sleeve which does help.  She says that overall her pain is better than what it was a couple days ago.  She denies any recent injury or trauma.  She points more to the medial ankle and foot where she gets majority of tenderness.  She has no other concerns.  Review of Systems  All other systems reviewed and are negative.  Past Medical History:  Diagnosis Date  . ADHD (attention deficit hyperactivity disorder)   . Anemia    postpartum  . Anxiety   . GERD (gastroesophageal reflux disease)   . History of chicken pox   . History of pyelonephritis    as child  . IBS (irritable bowel syndrome)   . Infection    UTI  . Interstitial cystitis   . Migraines   . Seasonal allergies   . Tachycardia   . Tilted uterus     Past Surgical History:  Procedure Laterality Date  . CESAREAN SECTION N/A 08/07/2015   Procedure: CESAREAN SECTION;  Surgeon: Marcelle OverlieMichelle Grewal, MD;  [facility];  Service: Obstetrics;  Laterality: N/A;  . CESAREAN SECTION    . dilate and curettage  2016  . TUBAL LIGATION    . Tubiligation       Current Outpatient Medications:  .  ALPRAZolam (XANAX) 0.25 MG tablet, Take 1 tablet (0.25 mg total) by mouth 3 (three) times daily as needed. No additional refills permitted, Disp: 30 tablet, Rfl: 0 .  escitalopram (LEXAPRO) 10 MG tablet, Take 10 mg by mouth daily.,  Disp: , Rfl: 12 .  fluticasone (FLONASE) 50 MCG/ACT nasal spray, Place 2 sprays into both nostrils daily., Disp: 1 g, Rfl: 0 .  ibuprofen (ADVIL,MOTRIN) 600 MG tablet, Take 1 tablet (600 mg total) by mouth every 6 (six) hours., Disp: 30 tablet, Rfl: 1 .  levonorgestrel (MIRENA, 52 MG,) 20 MCG/24HR IUD, 1 each by Intrauterine route once., Disp: , Rfl:  .  meloxicam (MOBIC) 7.5 MG tablet, Take 1 tablet (7.5 mg total) by mouth daily., Disp: 30 tablet, Rfl: 0 .  omeprazole (PRILOSEC) 20 MG capsule, Take 1 capsule (20 mg total) by mouth daily., Disp: 30 capsule, Rfl: 3 .  rizatriptan (MAXALT-MLT) 10 MG disintegrating tablet, Take 1 tab at the onset of migraine.May repeat in 2 hours if needed. Not to exceed 2 tabs/24 hours, Disp: 9 tablet, Rfl: 11 .  zolpidem (AMBIEN) 10 MG tablet, Take 10 mg by mouth at bedtime as needed for sleep., Disp: , Rfl:   Allergies  Allergen Reactions  . Compazine [Prochlorperazine Edisylate] Other (See Comments)         Objective:  Physical Exam  General: AAO x3, NAD  Dermatological: Skin is warm, dry and supple bilateral. Nails x 10 are well manicured; remaining integument appears  unremarkable at this time. There are no open sores, no preulcerative lesions, no rash or signs of infection present.  Vascular: Dorsalis Pedis artery and Posterior Tibial artery pedal pulses are 2/4 bilateral with immedate capillary fill time. Pedal hair growth present. No varicosities and no lower extremity edema present bilateral. There is no pain with calf compression, swelling, warmth, erythema.   Neruologic: Grossly intact via light touch bilateral. Vibratory intact via tuning fork bilateral. Protective threshold with Semmes Wienstein monofilament intact to all pedal sites bilateral.   Musculoskeletal: Mild decrease in medial arch on the right foot more so than the left foot.  There is tenderness to palpation along the medial aspect of the ankle is posterior to the medial malleolus on  the course of the flexor, posterior tibial tendon.  There is tenderness in the course the posterior tibial tendon posterior and inferior to the medial malleolus on the insertion of the navicular tuberosity.  There is no pain with ankle joint range of motion.  Achilles tendon appears to be intact.  Minimal discomfort along the medial band of plantar fascia in the arch of the foot.  No other areas of tenderness elicited at this time.  Muscular strength 5/5 in all groups tested bilateral.  Gait: Unassisted, Nonantalgic.       Assessment:   Right ankle posterior tibial tendinitis, plantar fasciitis     Plan:  -Treatment options discussed including all alternatives, risks, and complications -Etiology of symptoms were discussed -X-rays were obtained and reviewed with the patient.  No evidence of acute fracture or stress fracture identified at this time. -At this time given her ankle discomfort I did put her into a Tri-Lock ankle brace today.  I prescribed meloxicam for her as well.  Ice to the area daily.  Also he can start some home stretching exercises and I gave her a worksheet today for this but would also start physical therapy as this is been a chronic issue.  [facility] was written today.  For a skin check orthotic coverage for her.  Discussed shoe gear modifications.  Vivi BarrackMatthew R Latera Mclin DPM

## 2018-02-18 ENCOUNTER — Ambulatory Visit: Payer: Self-pay | Admitting: Podiatry

## 2018-02-20 ENCOUNTER — Other Ambulatory Visit: Payer: BLUE CROSS/BLUE SHIELD | Admitting: Orthotics

## 2018-02-20 DIAGNOSIS — M25571 Pain in right ankle and joints of right foot: Secondary | ICD-10-CM | POA: Diagnosis not present

## 2018-02-20 DIAGNOSIS — M62571 Muscle wasting and atrophy, not elsewhere classified, right ankle and foot: Secondary | ICD-10-CM | POA: Diagnosis not present

## 2018-02-20 DIAGNOSIS — M722 Plantar fascial fibromatosis: Secondary | ICD-10-CM | POA: Diagnosis not present

## 2018-02-20 DIAGNOSIS — R269 Unspecified abnormalities of gait and mobility: Secondary | ICD-10-CM | POA: Diagnosis not present

## 2018-02-20 DIAGNOSIS — M25471 Effusion, right ankle: Secondary | ICD-10-CM | POA: Diagnosis not present

## 2018-02-27 DIAGNOSIS — M62571 Muscle wasting and atrophy, not elsewhere classified, right ankle and foot: Secondary | ICD-10-CM | POA: Diagnosis not present

## 2018-02-27 DIAGNOSIS — M25571 Pain in right ankle and joints of right foot: Secondary | ICD-10-CM | POA: Diagnosis not present

## 2018-02-27 DIAGNOSIS — M25471 Effusion, right ankle: Secondary | ICD-10-CM | POA: Diagnosis not present

## 2018-02-27 DIAGNOSIS — R269 Unspecified abnormalities of gait and mobility: Secondary | ICD-10-CM | POA: Diagnosis not present

## 2018-03-03 DIAGNOSIS — R269 Unspecified abnormalities of gait and mobility: Secondary | ICD-10-CM | POA: Diagnosis not present

## 2018-03-03 DIAGNOSIS — M25471 Effusion, right ankle: Secondary | ICD-10-CM | POA: Diagnosis not present

## 2018-03-03 DIAGNOSIS — M25571 Pain in right ankle and joints of right foot: Secondary | ICD-10-CM | POA: Diagnosis not present

## 2018-03-03 DIAGNOSIS — M62571 Muscle wasting and atrophy, not elsewhere classified, right ankle and foot: Secondary | ICD-10-CM | POA: Diagnosis not present

## 2018-03-04 ENCOUNTER — Encounter

## 2018-03-04 ENCOUNTER — Ambulatory Visit: Payer: BLUE CROSS/BLUE SHIELD | Admitting: Diagnostic Neuroimaging

## 2018-03-12 ENCOUNTER — Ambulatory Visit: Payer: BLUE CROSS/BLUE SHIELD | Admitting: Orthotics

## 2018-03-12 DIAGNOSIS — R269 Unspecified abnormalities of gait and mobility: Secondary | ICD-10-CM | POA: Diagnosis not present

## 2018-03-12 DIAGNOSIS — M722 Plantar fascial fibromatosis: Secondary | ICD-10-CM

## 2018-03-12 DIAGNOSIS — M25571 Pain in right ankle and joints of right foot: Secondary | ICD-10-CM | POA: Diagnosis not present

## 2018-03-12 DIAGNOSIS — M62571 Muscle wasting and atrophy, not elsewhere classified, right ankle and foot: Secondary | ICD-10-CM | POA: Diagnosis not present

## 2018-03-12 DIAGNOSIS — M7750 Other enthesopathy of unspecified foot: Secondary | ICD-10-CM

## 2018-03-12 DIAGNOSIS — M25471 Effusion, right ankle: Secondary | ICD-10-CM | POA: Diagnosis not present

## 2018-03-12 NOTE — Progress Notes (Signed)
Patient came in today to pick up custom made foot orthotics.  The goals were accomplished and the patient reported no dissatisfaction with said orthotics.  Patient was advised of breakin period and how to report any issues. 

## 2018-03-14 ENCOUNTER — Encounter: Payer: Self-pay | Admitting: Adult Health

## 2018-03-17 ENCOUNTER — Encounter: Payer: Self-pay | Admitting: Adult Health

## 2018-03-17 ENCOUNTER — Other Ambulatory Visit: Payer: Self-pay | Admitting: Adult Health

## 2018-03-17 ENCOUNTER — Ambulatory Visit: Payer: BLUE CROSS/BLUE SHIELD | Admitting: Adult Health

## 2018-03-17 VITALS — BP 106/58 | HR 76 | Ht 67.0 in | Wt 207.2 lb

## 2018-03-17 DIAGNOSIS — G43109 Migraine with aura, not intractable, without status migrainosus: Secondary | ICD-10-CM | POA: Diagnosis not present

## 2018-03-17 MED ORDER — PANTOPRAZOLE SODIUM 20 MG PO TBEC: 20.0000 mg | DELAYED_RELEASE_TABLET | Freq: Every day | ORAL | 3 refills | Status: DC

## 2018-03-17 MED ORDER — FROVATRIPTAN SUCCINATE 2.5 MG PO TABS: 2.5000 mg | ORAL_TABLET | ORAL | 0 refills | Status: DC | PRN

## 2018-03-17 NOTE — Progress Notes (Signed)
PATIENT: Tanya [name] DOB: 1980/05/10  REASON FOR VISIT: follow up HISTORY FROM: patient  Chief Complaint  Patient presents with  . Migraine    "around my cycle I had to use Maxalt often but it helped"  . Follow-up    6 month     HISTORY OF PRESENT ILLNESS: Today 03/17/18 Tanya [name] is a 38 y.o. female with history of migraines with aura here today for follow up. She reports that she is feeling well. She has noticed that she continues to have 1-3 migraines around her menstrual cycle. She had Mirena placed in 11/2017. She has not correlated worsening of headaches since. She has not had an aura with headache in over a year. She is taking Lexapro daily for anxiety. She uses Maxalt as needed for headaches but feels it may wear off to soon. She is asking to try Frova as discussed in previous visit. She is also taking meloxicam daily for an ankle injury.   HISTORY: (copied from Tanya [name] note on 12/10/2017) [name] is a 38 year old female with a history of migraines.  He states that she has approximately 1 migraine a month.  It normally occurs the day before or the day of her menstrual cycle.  She states that her headache typically is precipitated by dizziness 1 to 2 days before the headache starts.  She states in the past she did take Imitrex nasal spray and it works well.  She states now is not giving her much benefit.  She states occasionally she has she will take the first dose and go to bed.  She recently had an IUD placed in hopes that that helps with her headaches as well.  In the past she has tried oral Imitrex with little benefit.  She returns today for evaluation.  REVIEW OF SYSTEMS: Out of a complete 14 system review of symptoms, the patient complains only of the following symptoms, joint pain, muscle cramps and all other reviewed systems are negative.  ALLERGIES: Allergies  Allergen Reactions  . Compazine [Prochlorperazine Edisylate] Other (See Comments)    HOME  MEDICATIONS: Outpatient Medications Prior to Visit  Medication Sig Dispense Refill  . ALPRAZolam (XANAX) 0.25 MG tablet Take 1 tablet (0.25 mg total) by mouth 3 (three) times daily as needed. No additional refills permitted 30 tablet 0  . escitalopram (LEXAPRO) 10 MG tablet Take 10 mg by mouth daily.  12  . fluticasone (FLONASE) 50 MCG/ACT nasal spray Place 2 sprays into both nostrils daily. 1 g 0  . ibuprofen (ADVIL,MOTRIN) 600 MG tablet Take 1 tablet (600 mg total) by mouth every 6 (six) hours. 30 tablet 1  . levonorgestrel (MIRENA, 52 MG,) 20 MCG/24HR IUD 1 each by Intrauterine route once.    . meloxicam (MOBIC) 7.5 MG tablet Take 1 tablet (7.5 mg total) by mouth daily. 30 tablet 0  . omeprazole (PRILOSEC) 20 MG capsule Take 1 capsule (20 mg total) by mouth daily. 30 capsule 3  . zolpidem (AMBIEN) 10 MG tablet Take 10 mg by mouth at bedtime as needed for sleep.    . rizatriptan (MAXALT-MLT) 10 MG disintegrating tablet Take 1 tab at the onset of migraine.May repeat in 2 hours if needed. Not to exceed 2 tabs/24 hours 9 tablet 11   No facility-administered medications prior to visit.     PAST MEDICAL HISTORY: Past Medical History:  Diagnosis Date  . ADHD (attention deficit hyperactivity disorder)   . Anemia    postpartum  .  Anxiety   . GERD (gastroesophageal reflux disease)   . History of chicken pox   . History of pyelonephritis    as child  . IBS (irritable bowel syndrome)   . Infection    UTI  . Interstitial cystitis   . Migraines   . Seasonal allergies   . Tachycardia   . Tilted uterus     PAST SURGICAL HISTORY: Past Surgical History:  Procedure Laterality Date  . CESAREAN SECTION N/A 08/07/2015   Procedure: CESAREAN SECTION;  Surgeon: Marcelle OverlieMichelle Grewal, MD;  Location: [facility];  Service: Obstetrics;  Laterality: N/A;  . CESAREAN SECTION    . dilate and curettage  2016  . TUBAL LIGATION    . Tubiligation      FAMILY HISTORY: Family History  Problem  Relation Age of Onset  . Hypertension Mother   . Hypothyroidism Mother   . Thyroid disease Mother   . Other Mother        benign brain tumor  . Depression Mother   . Hyperlipidemia Mother   . Cancer Maternal Grandfather        breast  . Heart attack Maternal Grandfather   . Hyperlipidemia Maternal Grandfather   . Hypertension Maternal Grandfather   . Breast cancer Maternal Grandfather   . Lupus Paternal Grandmother   . Diabetes Paternal Grandfather     SOCIAL HISTORY: Social History   Socioeconomic History  . Marital status: Married    Spouse name: Not on file  . Number of children: 3  . Years of education: college  . Highest education level: Not on file  Occupational History    Comment: CPA  Social Needs  . Financial resource strain: Not on file  . Food insecurity:    Worry: Not on file    Inability: Not on file  . Transportation needs:    Medical: Not on file    Non-medical: Not on file  Tobacco Use  . Smoking status: Never Smoker  . Smokeless tobacco: Never Used  Substance and Sexual Activity  . Alcohol use: Yes    Alcohol/week: 2.0 standard drinks    Types: 2 Glasses of wine per week    Comment: 1-2 per week  . Drug use: No  . Sexual activity: Yes    Birth control/protection: Surgical  Lifestyle  . Physical activity:    Days per week: Not on file    Minutes per session: Not on file  . Stress: Not on file  Relationships  . Social connections:    Talks on phone: Not on file    Gets together: Not on file    Attends religious service: Not on file    Active member of club or organization: Not on file    Attends meetings of clubs or organizations: Not on file    Relationship status: Not on file  . Intimate partner violence:    Fear of current or ex partner: Not on file    Emotionally abused: Not on file    Physically abused: Not on file    Forced sexual activity: Not on file  Other Topics Concern  . Not on file  Social History Narrative   Lives at  home with spouse, August SaucerDean, and 3 kids.  Works at [employer].  Education: college.  Caffeine 1-2 per day.       PHYSICAL EXAM  Vitals:   03/17/18 0738  BP: (!) 106/58  Pulse: 76  Weight: 207 lb 3.2 oz (94 kg)  Height: 5\' 7"  (1.702 m)   Body mass index is 32.45 kg/m.  Generalized: Well developed, in no acute distress  Cardiology: normal rate and rhythm, no murmur noted Neurological examination  Mentation: Alert oriented to time, place, history taking. Follows all commands speech and language fluent Cranial nerve II-XII: Pupils were equal round reactive to light. Extraocular movements were full, visual field were full on confrontational test. Facial sensation and strength were normal. Uvula tongue midline. Head turning and shoulder shrug  were normal and symmetric. Motor: The motor testing reveals 5 over 5 strength of all 4 extremities. Good symmetric motor tone is noted throughout.  Sensory: Sensory testing is intact to soft touch on all 4 extremities. No evidence of extinction is noted.  Coordination: Cerebellar testing reveals good finger-nose-finger  Gait and station: Gait is normal.  Reflexes: Deep tendon reflexes are symmetric and normal bilaterally.   DIAGNOSTIC DATA (LABS, IMAGING, TESTING) - I reviewed patient records, labs, notes, testing and imaging myself where available.  No flowsheet data found.   Lab Results  Component Value Date   WBC 6.3 01/16/2018   HGB 14.4 01/16/2018   HCT 44.0 01/16/2018   MCV 89 01/16/2018   PLT 254 01/16/2018      Component Value Date/Time   NA 138 01/16/2018 0933   K 4.4 01/16/2018 0933   CL 101 01/16/2018 0933   CO2 24 01/16/2018 0933   GLUCOSE 84 01/16/2018 0933   GLUCOSE 122 (H) 11/15/2017 0903   BUN 16 01/16/2018 0933   CREATININE 0.89 01/16/2018 0933   CREATININE 0.78 04/23/2013 0916   CALCIUM 9.2 01/16/2018 0933   PROT 6.9 01/16/2018 0933   ALBUMIN 4.4 01/16/2018 0933   AST 13 01/16/2018 0933   ALT 8  01/16/2018 0933   ALKPHOS 65 01/16/2018 0933   BILITOT 0.4 01/16/2018 0933   GFRNONAA 82 01/16/2018 0933   GFRAA 95 01/16/2018 0933   Lab Results  Component Value Date   CHOL 181 01/16/2018   HDL 57 01/16/2018   LDLCALC 109 (H) 01/16/2018   TRIG 73 01/16/2018   CHOLHDL 3.2 01/16/2018   Lab Results  Component Value Date   HGBA1C 5.1 01/16/2018   No results found for: [id number] Lab Results  Component Value Date   TSH 2.520 01/16/2018     ASSESSMENT AND PLAN 38 y.o. year old female  has a past medical history of ADHD (attention deficit hyperactivity disorder), Anemia, Anxiety, GERD (gastroesophageal reflux disease), History of chicken pox, History of pyelonephritis, IBS (irritable bowel syndrome), Infection, Interstitial cystitis, Migraines, Seasonal allergies, Tachycardia, and Tilted uterus. here with     ICD-10-CM   1. Migraine with aura and without status migrainosus, not intractable G43.109     She is doing well but is interested in trying a different abortive therapy. We will stop Maxalt and start Frova as prescribed. She was advised to keep a migraine journal to identify possible triggers. She was advised that daily use of meloxicam may contribute to rebound headaches. We have also discussed increased risks of stroke in patients with migraines with aura. She verbalizes understanding of s/s of stroke. She will follow up in 6 months, sooner if needed.    No orders of the defined types were placed in this encounter.    Meds ordered this encounter  Medications  . frovatriptan (FROVA) 2.5 MG tablet    Sig: Take 1 tablet (2.5 mg total) by mouth as needed for migraine. If recurs, may repeat after 2 hours. Max of  3 tabs in 24 hours.    Dispense:  10 tablet    Refill:  0    Order Specific Question:   Supervising Provider    Answer:   Anson FretHERN, ANTONIA B [id number]]      I spent 15 minutes with the patient. 50% of this time was spent counseling and educating patient on plan of  care and medications.    Shawnie Dappermy Eamon Tantillo, FNP-C 03/17/2018, 8:20 AM [facility] [address], [address] [place], [place] [address] [phone]

## 2018-03-17 NOTE — Patient Instructions (Signed)
Stop Maxalt Start Frova as prescribed (1 tablet with onset of headache, may repeat 1 tablet in 2 hours if needed, no more than 2 tablets in 24 hours.)    Migraine Headache  A migraine headache is a very strong throbbing pain on one side or both sides of your head. Migraines can also cause other symptoms. Talk with your doctor about what things may bring on (trigger) your migraine headaches. Follow these instructions at home: Medicines  Take over-the-counter and prescription medicines only as told by your doctor.  Do not drive or use heavy machinery while taking prescription pain medicine.  To prevent or treat constipation while you are taking prescription pain medicine, your doctor may recommend that you: ? Drink enough fluid to keep your pee (urine) clear or pale yellow. ? Take over-the-counter or prescription medicines. ? Eat foods that are high in fiber. These include fresh fruits and vegetables, whole grains, and beans. ? Limit foods that are high in fat and processed sugars. These include fried and sweet foods. Lifestyle  Avoid alcohol.  Do not use any products that contain nicotine or tobacco, such as cigarettes and e-cigarettes. If you need help quitting, ask your doctor.  Get at least 8 hours of sleep every night.  Limit your stress. General instructions   Keep a journal to find out what may bring on your migraines. For example, write down: ? What you eat and drink. ? How much sleep you get. ? Any change in what you eat or drink. ? Any change in your medicines.  If you have a migraine: ? Avoid things that make your symptoms worse, such as bright lights. ? It may help to lie down in a dark, quiet room. ? Do not drive or use heavy machinery. ? Ask your doctor what activities are safe for you.  Keep all follow-up visits as told by your doctor. This is important. Contact a doctor if:  You get a migraine that is different or worse than your usual migraines. Get help  right away if:  Your migraine gets very bad.  You have a fever.  You have a stiff neck.  You have trouble seeing.  Your muscles feel weak or like you cannot control them.  You start to lose your balance a lot.  You start to have trouble walking.  You pass out (faint). This information is not intended to replace advice given to you by your health care provider. Make sure you discuss any questions you have with your health care provider. Document Released: 11/08/2007 Document Revised: 10/23/2017 Document Reviewed: 07/18/2015 Elsevier Interactive Patient Education  2019 [employer]

## 2018-03-17 NOTE — Progress Notes (Signed)
I reviewed note and agree with plan.   VIKRAM R. PENUMALLI, MD 03/17/2018, 3:46 PM Certified in Neurology, Neurophysiology and Neuroimaging  [facility] [address], [address] , [place] [address] [phone]  

## 2018-03-28 ENCOUNTER — Ambulatory Visit: Payer: BLUE CROSS/BLUE SHIELD | Admitting: Podiatry

## 2018-03-28 ENCOUNTER — Telehealth: Payer: Self-pay | Admitting: *Deleted

## 2018-03-28 DIAGNOSIS — R269 Unspecified abnormalities of gait and mobility: Secondary | ICD-10-CM | POA: Diagnosis not present

## 2018-03-28 DIAGNOSIS — M62571 Muscle wasting and atrophy, not elsewhere classified, right ankle and foot: Secondary | ICD-10-CM | POA: Diagnosis not present

## 2018-03-28 DIAGNOSIS — M722 Plantar fascial fibromatosis: Secondary | ICD-10-CM

## 2018-03-28 DIAGNOSIS — M25571 Pain in right ankle and joints of right foot: Secondary | ICD-10-CM | POA: Diagnosis not present

## 2018-03-28 DIAGNOSIS — M25471 Effusion, right ankle: Secondary | ICD-10-CM | POA: Diagnosis not present

## 2018-03-28 DIAGNOSIS — M7750 Other enthesopathy of unspecified foot: Secondary | ICD-10-CM | POA: Diagnosis not present

## 2018-03-28 MED ORDER — DICLOFENAC SODIUM 75 MG PO TBEC: 75.0000 mg | DELAYED_RELEASE_TABLET | Freq: Two times a day (BID) | ORAL | 0 refills | Status: DC

## 2018-03-28 NOTE — Telephone Encounter (Signed)
Orders to J. Quintana, RN for pre-cert and faxed to [facility]. 

## 2018-03-28 NOTE — Patient Instructions (Signed)
I have ordered an MRI of the right ankle to further evaluate the tendon in your ankle. I would wear the walking boot during the day and we will put the PT on hold. Continue to ice daily and hold off on activity until we get the MRI.   Have a great weekend.

## 2018-03-28 NOTE — Progress Notes (Signed)
Subjective: [name] presents today to the office for follow-up evaluation of right ankle pain.  She states that when she came in originally that she was not having significant discomfort.  Pain is intermittent.  She gets pain she states that after she been sitting up she is not been as active as she starts to walk.  She thinks activity makes it better.  She has pain in the morning when she first gets up.  After she walks for about 5 to 10 minutes he feels better.  She has done some physical therapy but she feels that this has not been helpful and overall she is continued to have pain on the right ankle. Denies any systemic complaints such as fevers, chills, nausea, vomiting. No acute changes since last appointment, and no other complaints at this time.   Objective: AAO x3, NAD DP/PT pulses palpable bilaterally, CRT less than 3 seconds There is tenderness along the course of the posterior tibial tendon just posterior and inferior to the medial malleolus on the right ankle.  Overall the tendon appears to be intact.  There is minimal edema there is no erythema increased warmth.  There is no erythema.  No area pinpoint bony tenderness or pain to vibratory sensation.  No open lesions or pre-ulcerative lesions.  No pain with calf compression, swelling, warmth, erythema  Assessment: Continuation of right ankle pain, posterior tibial tendinitis-rule out tear  Plan: -All treatment options discussed with the patient including all alternatives, risks, complications.  -At this time she is not making significant improvement.  Because of this I will order an MRI.  This is to out a partial tear of the posterior tibial tendon.  She has had conservative care including ankle brace, physical therapy, anti-inflammatories without any significant improvement.  She has a cam boot at home and I want her to wear this as well for now.  Can hold off on exercise. -Patient encouraged to call the office with any questions,  concerns, change in symptoms.   Vivi Barrack DPM

## 2018-03-28 NOTE — Telephone Encounter (Signed)
-----   Message from Vivi Barrack, DPM sent at 03/28/2018  9:24 AM EST ----- Can you please order an MRI of the right ankle to rule out PT tendon tear? Thanks.

## 2018-04-08 ENCOUNTER — Ambulatory Visit
Admission: RE | Admit: 2018-04-08 | Discharge: 2018-04-08 | Disposition: A | Discharge status: Home / Self Care | Payer: BLUE CROSS/BLUE SHIELD | Source: Ambulatory Visit | Attending: Podiatry | Admitting: Podiatry

## 2018-04-08 DIAGNOSIS — M722 Plantar fascial fibromatosis: Secondary | ICD-10-CM

## 2018-04-08 DIAGNOSIS — M7989 Other specified soft tissue disorders: Secondary | ICD-10-CM | POA: Diagnosis not present

## 2018-04-08 DIAGNOSIS — M7750 Other enthesopathy of unspecified foot: Secondary | ICD-10-CM

## 2018-04-08 DIAGNOSIS — M25571 Pain in right ankle and joints of right foot: Secondary | ICD-10-CM | POA: Diagnosis not present

## 2018-04-10 ENCOUNTER — Encounter: Payer: Self-pay | Admitting: Podiatry

## 2018-04-11 ENCOUNTER — Encounter: Payer: Self-pay | Admitting: Podiatry

## 2018-04-11 NOTE — Telephone Encounter (Signed)
-----   Message from Vivi Barrack, DPM sent at 04/11/2018  6:52 AM EST ----- Val- can you please send the MRI for an over read? Specifically to look at the posterior tibial tendon/medial ankle tendons/ligaments. Thanks.

## 2018-04-11 NOTE — Telephone Encounter (Signed)
Left message and sent request for copy of MRI Disc to be sent to our office.

## 2018-04-15 NOTE — Telephone Encounter (Signed)
Received MRI disc and mailed to [facility]. 

## 2018-04-25 ENCOUNTER — Encounter: Payer: Self-pay | Admitting: Adult Health

## 2018-04-28 ENCOUNTER — Other Ambulatory Visit: Payer: Self-pay | Admitting: Adult Health

## 2018-04-28 MED ORDER — ALPRAZOLAM 0.25 MG PO TABS: 0.2500 mg | ORAL_TABLET | Freq: Three times a day (TID) | ORAL | 0 refills | Status: DC | PRN

## 2018-05-14 ENCOUNTER — Encounter: Payer: Self-pay | Admitting: Podiatry

## 2018-05-15 ENCOUNTER — Telehealth: Payer: Self-pay

## 2018-05-15 NOTE — Progress Notes (Signed)
Subjective:    Patient ID: Tanya [name], female    DOB: Dec 10, 1980, 38 y.o.   MRN: [id number]  HPI:  Ms. Tuominen presents with LLE edema and intermittent tingling in LLE that started 3 months ago. She reports swelling and "tightness" will worsen as the day progresses. She also states "my rings will get tight on my hands at times" She estimates to drink between 100-128 oz water/day and has been following a tight "Macro Diet"- 1500 cal of 140g protein, 100g CHO, 77 g fat per day She has been exercising 30-60 mins/day and working with health coach daily- she started these healthy measures approx 3 months ago She denies CP/dyspnea/dizziness/HA/palpitations Her BP runs low normal- SBP 90-100s, discussed that diuretic would not be safe  01/16/2018 CMP- WNL  Patient Care Team    Relationship Specialty Notifications Start End  William Hamburger D, NP PCP - General Family Medicine  12/10/17   Suanne Marker, MD Consulting Physician Neurology  12/24/17   Candice Camp, MD Consulting Physician Obstetrics and Gynecology  12/24/17   Orlinda Blalock, MD Referring Physician Urology  12/24/17     Patient Active Problem List   Diagnosis Date Noted  . Localized edema 05/19/2018  . Numbness and tingling of left leg 05/19/2018  . Insomnia 05/19/2018  . BMI 31.0-31.9,adult 01/22/2018  . Healthcare maintenance 12/24/2017  . GERD (gastroesophageal reflux disease) 12/24/2017  . Anemia 12/24/2017  . GAD (generalized anxiety disorder) 12/24/2017  . Migraine with aura and without status migrainosus, not intractable 02/01/2017  . S/P cesarean section 08/07/2015  . Placenta previa antepartum 08/03/2015  . TACHYCARDIA 06/07/2008  . DYSPNEA 06/07/2008     Past Medical History:  Diagnosis Date  . ADHD (attention deficit hyperactivity disorder)   . Anemia    postpartum  . Anxiety   . GERD (gastroesophageal reflux disease)   . History of chicken pox   . History of pyelonephritis    as child  . IBS  (irritable bowel syndrome)   . Infection    UTI  . Interstitial cystitis   . Migraines   . Seasonal allergies   . Tachycardia   . Tilted uterus      Past Surgical History:  Procedure Laterality Date  . CESAREAN SECTION N/A 08/07/2015   Procedure: CESAREAN SECTION;  Surgeon: Marcelle Overlie, MD;  Location: [facility] SU[facility];  Service: Obstetrics;  Laterality: N/A;  . CESAREAN SECTION    . dilate and curettage  2016  . TUBAL LIGATION    . Tubiligation       Family History  Problem Relation Age of Onset  . Hypertension Mother   . Hypothyroidism Mother   . Thyroid disease Mother   . Other Mother        benign brain tumor  . Depression Mother   . Hyperlipidemia Mother   . Cancer Maternal Grandfather        breast  . Heart attack Maternal Grandfather   . Hyperlipidemia Maternal Grandfather   . Hypertension Maternal Grandfather   . Breast cancer Maternal Grandfather   . Lupus Paternal Grandmother   . Diabetes Paternal Grandfather      Social History   Substance and Sexual Activity  Drug Use No     Social History   Substance and Sexual Activity  Alcohol Use Yes  . Alcohol/week: 2.0 standard drinks  . Types: 2 Glasses of wine per week   Comment: 1-2 per week     Social History  Tobacco Use  Smoking Status Never Smoker  Smokeless Tobacco Never Used     Outpatient Encounter Medications as of 05/19/2018  Medication Sig  . ALPRAZolam (XANAX) 0.25 MG tablet Take 1 tablet (0.25 mg total) by mouth 3 (three) times daily as needed. No additional refills permitted  . escitalopram (LEXAPRO) 10 MG tablet Take 10 mg by mouth daily.  . fluticasone (FLONASE) 50 MCG/ACT nasal spray Place 2 sprays into both nostrils daily.  . frovatriptan (FROVA) 2.5 MG tablet Take 1 tablet (2.5 mg total) by mouth as needed for migraine. If recurs, may repeat after 2 hours. Max of 3 tabs in 24 hours.  Marland Kitchen levonorgestrel (MIRENA, 52 MG,) 20 MCG/24HR IUD 1 each by Intrauterine route once.   . pantoprazole (PROTONIX) 20 MG tablet Take 1 tablet (20 mg total) by mouth daily.  Marland Kitchen zolpidem (AMBIEN) 10 MG tablet Take 1 tablet (10 mg total) by mouth at bedtime as needed for sleep.  . [DISCONTINUED] zolpidem (AMBIEN) 10 MG tablet Take 10 mg by mouth at bedtime as needed for sleep.  . [DISCONTINUED] diclofenac (VOLTAREN) 75 MG EC tablet Take 1 tablet (75 mg total) by mouth 2 (two) times daily.  . [DISCONTINUED] ibuprofen (ADVIL,MOTRIN) 600 MG tablet Take 1 tablet (600 mg total) by mouth every 6 (six) hours.  . [DISCONTINUED] meloxicam (MOBIC) 7.5 MG tablet Take 1 tablet (7.5 mg total) by mouth daily.   No facility-administered encounter medications on file as of 05/19/2018.     Allergies: Compazine [prochlorperazine edisylate]  Body mass index is 31.45 kg/m.  Blood pressure 101/69, pulse 74, temperature 98.2 F (36.8 C), temperature source Oral, height 5\' 7"  (1.702 m), weight 200 lb 12.8 oz (91.1 kg), last menstrual period 05/11/2018, SpO2 100 %.  Review of Systems  Constitutional: Positive for fatigue. Negative for activity change, appetite change, chills, diaphoresis, fever and unexpected weight change.  Eyes: Negative for visual disturbance.  Respiratory: Negative for cough, chest tightness, shortness of breath, wheezing and stridor.   Cardiovascular: Positive for leg swelling. Negative for chest pain and palpitations.  Gastrointestinal: Negative for abdominal distention, abdominal pain, anal bleeding, blood in stool, constipation, diarrhea, nausea and rectal pain.  Endocrine: Negative for cold intolerance, heat intolerance, polydipsia and polyuria.  Genitourinary: Negative for difficulty urinating and flank pain.  Musculoskeletal: Negative for arthralgias, back pain, gait problem, joint swelling, myalgias, neck pain and neck stiffness.  Skin: Negative for color change, pallor, rash and wound.  Neurological: Positive for tremors. Negative for dizziness and headaches.   Hematological: Does not bruise/bleed easily.  Psychiatric/Behavioral: Positive for sleep disturbance. Negative for agitation, behavioral problems, confusion, decreased concentration, dysphoric mood, hallucinations, self-injury and suicidal ideas. The patient is not nervous/anxious and is not hyperactive.        Objective:   Physical Exam Constitutional:      General: She is not in acute distress.    Appearance: She is not ill-appearing, toxic-appearing or diaphoretic.  Cardiovascular:     Rate and Rhythm: Normal rate.     Pulses: Normal pulses.     Heart sounds: Normal heart sounds. No murmur. No friction rub. No gallop.   Pulmonary:     Effort: Pulmonary effort is normal. No respiratory distress.     Breath sounds: Normal breath sounds. No stridor. No wheezing, rhonchi or rales.  Chest:     Chest wall: No tenderness.  Musculoskeletal:     Left knee: Normal.     Right ankle: She exhibits normal range of motion, no  swelling and normal pulse.     Left ankle: She exhibits swelling. She exhibits normal range of motion and normal pulse.     Comments: L foot- very trace edema  Normal pedal pulses, brisk cap refill bilaterally   Skin:    General: Skin is warm and dry.     Capillary Refill: Capillary refill takes less than 2 seconds.  Neurological:     Mental Status: She is alert and oriented to person, place, and time.  Psychiatric:        Mood and Affect: Mood normal.        Behavior: Behavior normal.        Thought Content: Thought content normal.        Judgment: Judgment normal.       Assessment & Plan:   1. Localized edema   2. Numbness and tingling of left leg   3. Insomnia, unspecified type    Localized edema Since your blood pressure is so low, starting a diuretic would not be safe. We ill call you when lab results are available. Continue healthy eating, regular exercise, and proper hydration. Recommend compression socks to help reduce lower extremity  swelling. Please call clinic with questions/concerns.  Insomnia [place] Controlled Substance Database reviewed- no aberrancies noted Refill on Zolpidem 10mg  sent in.  FOLLOW-UP:  Return if symptoms worsen or fail to improve.

## 2018-05-15 NOTE — Telephone Encounter (Signed)
Pt called c/o left ankle and leg swelling x 1-2 months.  Pt denies that the area is hot to touch, pain, or pain with palpation.  Pt states that the swelling is improved when arising from bed in AMs, but gets progressively worse during the day.  Pt drinks 100oz/d of water and she denies eating much take out food, frozen foods, or food from a can.  Per William Hamburger, NP pt advised that she needs OV on 05/19/2018.  Pt expressed understanding, is agreeable, and was transferred to front desk to schedule OV.  Tiajuana Amass, CMA

## 2018-05-19 ENCOUNTER — Encounter: Payer: Self-pay | Admitting: Adult Health

## 2018-05-19 ENCOUNTER — Other Ambulatory Visit: Payer: Self-pay

## 2018-05-19 ENCOUNTER — Ambulatory Visit (INDEPENDENT_AMBULATORY_CARE_PROVIDER_SITE_OTHER): Payer: BLUE CROSS/BLUE SHIELD | Admitting: Adult Health

## 2018-05-19 VITALS — BP 101/69 | HR 74 | Temp 98.2°F | Ht 67.0 in | Wt 200.8 lb

## 2018-05-19 DIAGNOSIS — G4700 Insomnia, unspecified: Secondary | ICD-10-CM | POA: Diagnosis not present

## 2018-05-19 DIAGNOSIS — R600 Localized edema: Secondary | ICD-10-CM

## 2018-05-19 DIAGNOSIS — R202 Paresthesia of skin: Secondary | ICD-10-CM | POA: Diagnosis not present

## 2018-05-19 DIAGNOSIS — R200 Anesthesia of skin: Secondary | ICD-10-CM

## 2018-05-19 HISTORY — DX: Anesthesia of skin: R20.0

## 2018-05-19 HISTORY — DX: Localized edema: R60.0

## 2018-05-19 HISTORY — DX: Insomnia, unspecified: G47.00

## 2018-05-19 MED ORDER — ZOLPIDEM TARTRATE 10 MG PO TABS: 10.0000 mg | ORAL_TABLET | Freq: Every evening | ORAL | 0 refills | Status: DC | PRN

## 2018-05-19 NOTE — Patient Instructions (Signed)
Edema  Edema is an abnormal buildup of fluids in the body tissues and under the skin. Swelling of the legs, feet, and ankles is a common symptom that becomes more likely as you get older. Swelling is also common in looser tissues, like around the eyes. When the affected area is squeezed, the fluid may move out of that spot and leave a dent for a few moments. This dent is called pitting edema. There are many possible causes of edema. Eating too much salt (sodium) and being on your feet or sitting for a long time can cause edema in your legs, feet, and ankles. Hot weather may make edema worse. Common causes of edema include:  Heart failure.  Liver or kidney disease.  Weak leg blood vessels.  Cancer.  An injury.  Pregnancy.  Medicines.  Being obese.  Low protein levels in the blood. Edema is usually painless. Your skin may look swollen or shiny. Follow these instructions at home:  Keep the affected body part raised (elevated) above the level of your heart when you are sitting or lying down.  Do not sit still or stand for long periods of time.  Do not wear tight clothing. Do not wear garters on your upper legs.  Exercise your legs to get your circulation going. This helps to move the fluid back into your blood vessels, and it may help the swelling go down.  Wear elastic bandages or support stockings to reduce swelling as told by your health care provider.  Eat a low-salt (low-sodium) diet to reduce fluid as told by your health care provider.  Depending on the cause of your swelling, you may need to limit how much fluid you drink (fluid restriction).  Take over-the-counter and prescription medicines only as told by your health care provider. Contact a health care provider if:  Your edema does not get better with treatment.  You have heart, liver, or kidney disease and have symptoms of edema.  You have sudden and unexplained weight gain. Get help right away if:  You develop  shortness of breath or chest pain.  You cannot breathe when you lie down.  You develop pain, redness, or warmth in the swollen areas.  You have heart, liver, or kidney disease and suddenly get edema.  You have a fever and your symptoms suddenly get worse. Summary  Edema is an abnormal buildup of fluids in the body tissues and under the skin.  Eating too much salt (sodium) and being on your feet or sitting for a long time can cause edema in your legs, feet, and ankles.  Keep the affected body part raised (elevated) above the level of your heart when you are sitting or lying down. This information is not intended to replace advice given to you by your health care provider. Make sure you discuss any questions you have with your health care provider. Document Released: 01/29/2005 Document Revised: 03/03/2016 Document Reviewed: 03/03/2016 Elsevier Interactive Patient Education  2019 [employer].  Since your blood pressure is so low, starting a diuretic would not be safe. We ill call you when lab results are available. Refill on Zolpidem sent in. Continue healthy eating, regular exercise, and proper hydration. Recommend compression socks to help reduce lower extremity swelling. Please call clinic with questions/concerns. Stay home, stay safe during the COVID-19 pandemic.

## 2018-05-19 NOTE — Assessment & Plan Note (Signed)
[place] Controlled Substance Database reviewed- no aberrancies noted Refill on Zolpidem 10mg  sent in.

## 2018-05-19 NOTE — Telephone Encounter (Signed)
Dr. Ardelle Anton stated he had sent a message through MyChart, and pt had not read it, to contact pt and offer virtual appt or in-office appt. I called pt and she stated her phone did not alert her of the message and she would like to perform a virtual visit. I told pt I would have a scheduler call to set up.

## 2018-05-19 NOTE — Assessment & Plan Note (Signed)
Since your blood pressure is so low, starting a diuretic would not be safe. We ill call you when lab results are available. Continue healthy eating, regular exercise, and proper hydration. Recommend compression socks to help reduce lower extremity swelling. Please call clinic with questions/concerns.

## 2018-05-20 ENCOUNTER — Telehealth (INDEPENDENT_AMBULATORY_CARE_PROVIDER_SITE_OTHER): Payer: BLUE CROSS/BLUE SHIELD | Admitting: Podiatry

## 2018-05-20 DIAGNOSIS — M25579 Pain in unspecified ankle and joints of unspecified foot: Secondary | ICD-10-CM | POA: Diagnosis not present

## 2018-05-20 DIAGNOSIS — S96911A Strain of unspecified muscle and tendon at ankle and foot level, right foot, initial encounter: Secondary | ICD-10-CM

## 2018-05-20 DIAGNOSIS — M7750 Other enthesopathy of unspecified foot: Secondary | ICD-10-CM

## 2018-05-20 LAB — COMPREHENSIVE METABOLIC PANEL
ALT: 16 IU/L (ref 0–32)
AST: 19 IU/L (ref 0–40)
Albumin/Globulin Ratio: 2 (ref 1.2–2.2)
Albumin: 4.5 g/dL (ref 3.8–4.8)
Alkaline Phosphatase: 59 IU/L (ref 39–117)
BUN/Creatinine Ratio: 17 (ref 9–23)
BUN: 14 mg/dL (ref 6–20)
Bilirubin Total: 0.3 mg/dL (ref 0.0–1.2)
CO2: 25 mmol/L (ref 20–29)
Calcium: 9.4 mg/dL (ref 8.7–10.2)
Chloride: 100 mmol/L (ref 96–106)
Creatinine, Ser: 0.84 mg/dL (ref 0.57–1.00)
GFR calc Af Amer: 101 mL/min/{1.73_m2} (ref >59)
GFR calc non Af Amer: 88 mL/min/{1.73_m2} (ref >59)
Globulin, Total: 2.3 g/dL (ref 1.5–4.5)
Glucose: 84 mg/dL (ref 65–99)
Potassium: 4.8 mmol/L (ref 3.5–5.2)
Sodium: 139 mmol/L (ref 134–144)
Total Protein: 6.8 g/dL (ref 6.0–8.5)

## 2018-05-20 LAB — MAGNESIUM: Magnesium: 2 mg/dL (ref 1.6–2.3)

## 2018-05-20 MED ORDER — IBUPROFEN 800 MG PO TABS: 800.0000 mg | ORAL_TABLET | Freq: Three times a day (TID) | ORAL | 0 refills | Status: DC | PRN

## 2018-05-22 NOTE — Progress Notes (Signed)
Virtual Visit via Telephone Note  I connected with Tanya [name] on 05/22/18 at 11:30 AM EDT by telephone and verified that I am speaking with the correct person using two identifiers.   I discussed the limitations, risks, security and privacy concerns of performing an evaluation and management service by telephone and the availability of in person appointments. I also discussed with the patient that there may be a patient responsible charge related to this service. The patient expressed understanding and agreed to proceed.  Tanya [name] was at home on her cell phone and I was in the office at the time of the call.    History of Present Illness: 38 year old female was called to go over MRI results and discuss further treatment options.  She is having pain to her ankle.  After we discussed how she is doing she states that she was doing better initially and not having much pain.  However she states that when she starts going to family walks on hard surfaces this is what is causing the majority of her discomfort at this time.  The orthotics were helping.  She has been continue with the stretching exercises as well as weight training wear supportive shoes.  She said no significant discomfort with strength training.  Also she has been home more recently because of the recent pandemic in her activity level is changed and this may been what made her symptoms start to recur.  She denies any recent injury or changes.   Observations/Objective: Clinically she still describing discomfort to the ankle itself.  Joint tenderness is still along the inside aspect of the ankle.  She states that she has no tenderness to the outside aspect of the ankle.  No significant increase in swelling.  Overall again she reports that her symptoms had improved however since doing more walking her symptoms have started to come back.  We discussed the types issues that she was when she is walking.  She is wearing a hard stiff  shoe.  Assessment and Plan: Posterior tibial tendinitis; possible strain  Today we discussed multiple treatment options.  I want her to continue with home stretching, rehab exercise as well as the orthotics.  I discussed with her trying to see if the orthotics need adjustment but she is felt that they are fitting comfortably.  We did discuss shoe modifications as well.  We discussed trying to start to walk and increase her walking on a softer surface before doing extended walks on hard pavement. If she needs orthotic adjustment to let me know.   Follow Up Instructions: RTC  1 month or sooner if needed    I discussed the assessment and treatment plan with the patient. The patient was provided an opportunity to ask questions and all were answered. The patient agreed with the plan and demonstrated an understanding of the instructions.   The patient was advised to call back or seek an in-person evaluation if the symptoms worsen or if the condition fails to improve as anticipated.  I provided 15 minutes of non-face-to-face time during this encounter.   Vivi Barrack, DPM

## 2018-06-02 DIAGNOSIS — Z30431 Encounter for routine checking of intrauterine contraceptive device: Secondary | ICD-10-CM | POA: Diagnosis not present

## 2018-06-02 DIAGNOSIS — N939 Abnormal uterine and vaginal bleeding, unspecified: Secondary | ICD-10-CM | POA: Diagnosis not present

## 2018-06-02 DIAGNOSIS — N816 Rectocele: Secondary | ICD-10-CM | POA: Diagnosis not present

## 2018-06-12 ENCOUNTER — Encounter: Payer: Self-pay | Admitting: Adult Health

## 2018-06-12 ENCOUNTER — Other Ambulatory Visit: Payer: Self-pay | Admitting: Adult Health

## 2018-06-12 DIAGNOSIS — K589 Irritable bowel syndrome without diarrhea: Secondary | ICD-10-CM

## 2018-06-13 ENCOUNTER — Other Ambulatory Visit: Payer: Self-pay

## 2018-06-16 ENCOUNTER — Encounter: Payer: Self-pay | Admitting: Gastroenterology

## 2018-06-16 ENCOUNTER — Ambulatory Visit (INDEPENDENT_AMBULATORY_CARE_PROVIDER_SITE_OTHER): Payer: BLUE CROSS/BLUE SHIELD | Admitting: Gastroenterology

## 2018-06-16 ENCOUNTER — Other Ambulatory Visit: Payer: Self-pay

## 2018-06-16 VITALS — Ht 67.0 in | Wt 190.0 lb

## 2018-06-16 DIAGNOSIS — K589 Irritable bowel syndrome without diarrhea: Secondary | ICD-10-CM

## 2018-06-16 DIAGNOSIS — R142 Eructation: Secondary | ICD-10-CM | POA: Diagnosis not present

## 2018-06-16 DIAGNOSIS — R140 Abdominal distension (gaseous): Secondary | ICD-10-CM

## 2018-06-16 DIAGNOSIS — K219 Gastro-esophageal reflux disease without esophagitis: Secondary | ICD-10-CM | POA: Diagnosis not present

## 2018-06-16 MED ORDER — PANTOPRAZOLE SODIUM 40 MG PO TBEC: 40.0000 mg | DELAYED_RELEASE_TABLET | Freq: Every day | ORAL | 2 refills | Status: DC

## 2018-06-16 NOTE — Progress Notes (Signed)
TELEHEALTH VISIT  Referring Provider: Esaw Grandchild, NP Primary Care Physician:  Esaw Grandchild, NP   Tele-visit due to COVID-19 pandemic Patient requested visit virtually, consented to the virtual encounter via video enabled telemedicine application (Zoom) Contact made at: 08:30 06/16/18 Patient verified by name and date of birth Location of patient: Home Location provider: [place] [facility] Names of persons participating: Me, patient, Victor Time spent on telehealth visit: 28 minutes I discussed the limitations of evaluation and management by telemedicine. The patient expressed understanding and agreed to proceed.  Reason for Consultation:  IBS   IMPRESSION:  Gas, bloating, and eructation    - not improving with lactose free diet IBS diagnosed by Dr. Roney Mans GERD x 20 years on pantoprazole 20 mg daily Rectocele Intentional weight loss of 13 pounds since January   Differential: GERD, SIBO, celiac, food intolerance, gastroparesis, altered fundic accomodation, functional dyspepsia    PLAN: Obtain records from Dr. 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 The history is obtained through the patient and review of her electronic health record.  Reflux since a teenager, off and on medications. Known rectocele that she is hoping to consider surgery for as she gets closer to age 70.    Saw Dr. Roney Mans in [place] at age 21 for poor appetite. She had a barium study at that time. He diagnosed her  with IBS and offered some treatment. She can't remember the medication or if it worked.  She removed sausage and tomato sauce at that time.  She has tried to manage it on her own throughout the years, largely through diet.  Having significant gas over the last few months - eructation, borborygmous, and flatus. Some exacerbation after eating (broccoli and salad were the examples provided) within 30 minutes. Some associated distension. Some improvement with defecation.  Symptoms are present even when not related to food. Worse at night, particularly one hour after dinner which is her biggest meal of the day. No abdominal distension.  Stopped dairy several weeks ago without change in her symptoms with some improvement in gas but not the gurgling. Now with more diarrhea.  Noting undigested food particles in her stool.   Resumed pantoprazole November 2019. Does not tolerate omeprazole. No significant change in recent symptoms with pantoprazole.   Drinks plenty of water. Good appetite. Rare Stevia. Rare diet soda. No other sugar substitues. Working with a nutritionist to lose weight. She has lost 13 pounds since January. Does not correlate the diet with her symptoms. She thinks that overall, things are better on her healthy diet. No other associated symptoms. No identified exacerbating or relieving features.   Aunt required kidney and liver transplant. Mom and sister have BRCA2 mutation. No known family history of colon cancer or polyps. No family history of uterine/endometrial cancer, pancreatic cancer or gastric/stomach cancer.  Past Medical History:  Diagnosis Date   ADHD (attention deficit hyperactivity disorder)    Anemia    postpartum   Anxiety    GERD (gastroesophageal reflux disease)  History of chicken pox    History of pyelonephritis    as child   IBS (irritable bowel syndrome)    Infection    UTI   Interstitial cystitis    Migraines    Seasonal allergies    Tachycardia      Tilted uterus     Past Surgical History:  Procedure Laterality Date   CESAREAN SECTION N/A 08/07/2015   Procedure: CESAREAN SECTION;  Surgeon: Dian Queen, MD;  Location: [facility];  Service: Obstetrics;  Laterality: N/A;   CESAREAN SECTION     dilate and curettage  2016   TUBAL LIGATION     Tubiligation      Current Outpatient Medications  Medication Sig Dispense Refill   ALPRAZolam (XANAX) 0.25 MG tablet Take 1 tablet (0.25 mg total) by mouth 3 (three) times daily as needed. No additional refills permitted 30 tablet 0   escitalopram (LEXAPRO) 10 MG tablet Take 10 mg by mouth daily.  12   frovatriptan (FROVA) 2.5 MG tablet Take 1 tablet (2.5 mg total) by mouth as needed for migraine. If recurs, may repeat after 2 hours. Max of 3 tabs in 24 hours. 10 tablet 0   ibuprofen (ADVIL,MOTRIN) 800 MG tablet Take 1 tablet (800 mg total) by mouth every 8 (eight) hours as needed. 30 tablet 0   levonorgestrel (MIRENA, 52 MG,) 20 MCG/24HR IUD 1 each by Intrauterine route once.     pantoprazole (PROTONIX) 20 MG tablet Take 1 tablet (20 mg total) by mouth daily. 90 tablet 3   zolpidem (AMBIEN) 10 MG tablet Take 1 tablet (10 mg total) by mouth at bedtime as needed for sleep. 30 tablet 0   No current facility-administered medications for this visit.     Allergies as of 06/16/2018 - Review Complete 06/16/2018  Allergen Reaction Noted   Compazine [prochlorperazine edisylate] Other (See Comments) 12/24/2017    Family History  Problem Relation Age of Onset   Hypertension Mother    Hypothyroidism Mother    Thyroid disease Mother    Other Mother        benign brain tumor   Depression Mother    Hyperlipidemia Mother    Cancer Maternal Grandfather        breast   Heart attack Maternal Grandfather    Hyperlipidemia Maternal Grandfather    Hypertension Maternal Grandfather    Breast cancer Maternal Grandfather    Lupus Paternal Grandmother    Diabetes  Paternal Grandfather     Social History   Socioeconomic History   Marital status: Married    Spouse name: Not on file   Number of children: 3   Years of education: college   Highest education level: Not on file  Occupational History    Comment: Engineer, maintenance (IT)  Social Needs   Financial resource strain: Not on file   Food insecurity:    Worry: Not on file    Inability: Not on file   Transportation needs:    Medical: Not on file    Non-medical: Not on file  Tobacco Use   Smoking status: Never Smoker   Smokeless tobacco: Never Used  Substance and Sexual Activity   Alcohol use: Yes    Alcohol/week: 2.0 standard drinks    Types: 2 Glasses of wine per week    Comment: 1-2 per week   Drug use: No   Sexual activity: Yes    Birth control/protection: Surgical  Lifestyle   Physical activity:    Days per week:  Not on file    Minutes per session: Not on file   Stress: Not on file  Relationships   Social connections:    Talks on phone: Not on file    Gets together: Not on file    Attends religious service: Not on file    Active member of club or organization: Not on file    Attends meetings of clubs or organizations: Not on file    Relationship status: Not on file   Intimate partner violence:    Fear of current or ex partner: Not on file    Emotionally abused: Not on file    Physically abused: Not on file    Forced sexual activity: Not on file  Other Topics Concern   Not on file  Social History Narrative   Lives at home with spouse, Marlou Sa, and 3 kids.  Works at [employer].  Education: college.  Caffeine 1-2 per day.     Review of Systems: ALL ROS discussed and all others negative except listed in HPI.  Physical Exam: General: in no acute distress Neuro: Alert and appropriate Psych: Normal affect and normal insight   Randye Treichler L. Tarri Glenn, MD, MPH [facility] 06/16/2018, 8:32 AM

## 2018-06-16 NOTE — Patient Instructions (Signed)
I recommend a diet that avoids - Carbonated beverages - Caffeinated beverages  - Dairy   - legumes, onions - cabbage, brussel sprouts - artificial sweeteners  Increase pantoprazole to 40 mg daily. Take this 30-60 minutes before eating.  Please try at least two weeks of daily Citrucel. If this doesn't improve your symptoms, take a daily probiotic such as Align for at least two weeks.  Let's plan to follow-up in one month to see what's working.  I will also try to obtain your records from Dr. Merri Brunette.  Thank you for your patience with me and our technology today! Please stay home, safe, and healthy. I look forward to meeting you in person in the future.

## 2018-06-17 ENCOUNTER — Ambulatory Visit: Payer: BLUE CROSS/BLUE SHIELD | Admitting: Podiatry

## 2018-06-19 ENCOUNTER — Ambulatory Visit (INDEPENDENT_AMBULATORY_CARE_PROVIDER_SITE_OTHER): Payer: BLUE CROSS/BLUE SHIELD | Admitting: Adult Health

## 2018-06-19 ENCOUNTER — Other Ambulatory Visit: Payer: Self-pay

## 2018-06-19 ENCOUNTER — Encounter: Payer: Self-pay | Admitting: Adult Health

## 2018-06-19 DIAGNOSIS — S40261A Insect bite (nonvenomous) of right shoulder, initial encounter: Secondary | ICD-10-CM

## 2018-06-19 DIAGNOSIS — W57XXXA Bitten or stung by nonvenomous insect and other nonvenomous arthropods, initial encounter: Secondary | ICD-10-CM | POA: Diagnosis not present

## 2018-06-19 MED ORDER — DOXYCYCLINE HYCLATE 100 MG PO TABS: 100.0000 mg | ORAL_TABLET | Freq: Two times a day (BID) | ORAL | 0 refills | Status: DC

## 2018-06-19 NOTE — Progress Notes (Signed)
Virtual Visit via Video Note  I connected with Tanya [name] on 06/19/18 at  2:15 PM EDT by a video enabled telemedicine application and verified that I am speaking with the correct person using two identifiers.  Location: Patient: Home Provider: In Clinic   I discussed the limitations of evaluation and management by telemedicine and the availability of in person appointments. The patient expressed understanding and agreed to proceed.  History of Present Illness: Tanya [name] calls in today with insect bite during the night 6 days ago- she never saw the insect/tick. Bite is located on R shoulder She reports intermittent "burning/aching" pain, 6/10 She denies bite area warm to the touch. She reports "maybe my joints have been aching the last few days, but its hard to tell b/c I have been exercising too" She denies fever, however reports waking up hot last night. She reports nausea without vomiting for the last 24 hours. She has been using OTC Ibuprofen 800mg  BID for the last 3 days with only minimal sx relief. She denies ABX use in last 90 days   Patient Care Team    Relationship Specialty Notifications Start End  Julaine Fusi, NP PCP - General Family Medicine  12/10/17   Suanne Marker, MD Consulting Physician Neurology  12/24/17   Candice Camp, MD Consulting Physician Obstetrics and Gynecology  12/24/17   Orlinda Blalock, MD Referring Physician Urology  12/24/17     Patient Active Problem List   Diagnosis Date Noted  . Localized edema 05/19/2018  . Numbness and tingling of left leg 05/19/2018  . Insomnia 05/19/2018  . BMI 31.0-31.9,adult 01/22/2018  . Healthcare maintenance 12/24/2017  . GERD (gastroesophageal reflux disease) 12/24/2017  . Anemia 12/24/2017  . GAD (generalized anxiety disorder) 12/24/2017  . Migraine with aura and without status migrainosus, not intractable 02/01/2017  . S/P cesarean section 08/07/2015  . Placenta previa antepartum 08/03/2015  .  TACHYCARDIA 06/07/2008  . DYSPNEA 06/07/2008     Past Medical History:  Diagnosis Date  . ADHD (attention deficit hyperactivity disorder)   . Anemia    postpartum  . Anxiety   . GERD (gastroesophageal reflux disease)   . History of chicken pox   . History of pyelonephritis    as child  . IBS (irritable bowel syndrome)   . Infection    UTI  . Interstitial cystitis   . Migraines   . Seasonal allergies   . Tachycardia   . Tilted uterus      Past Surgical History:  Procedure Laterality Date  . CESAREAN SECTION N/A 08/07/2015   Procedure: CESAREAN SECTION;  Surgeon: Marcelle Overlie, MD;  [facility]: [facility];  Service: Obstetrics;  Laterality: N/A;  . CESAREAN SECTION    . dilate and curettage  2016  . TUBAL LIGATION    . Tubiligation       Family History  Problem Relation Age of Onset  . Hypertension Mother   . Hypothyroidism Mother   . Thyroid disease Mother   . Other Mother        benign brain tumor  . Depression Mother   . Hyperlipidemia Mother   . Cancer Maternal Grandfather        breast  . Heart attack Maternal Grandfather   . Hyperlipidemia Maternal Grandfather   . Hypertension Maternal Grandfather   . Breast cancer Maternal Grandfather   . Lupus Paternal Grandmother   . Diabetes Paternal Grandfather      Social History   Substance and  Sexual Activity  Drug Use No     Social History   Substance and Sexual Activity  Alcohol Use Yes  . Alcohol/week: 2.0 standard drinks  . Types: 2 Glasses of wine per week   Comment: 1-2 per week     Social History   Tobacco Use  Smoking Status Never Smoker  Smokeless Tobacco Never Used     Outpatient Encounter Medications as of 06/19/2018  Medication Sig  . ALPRAZolam (XANAX) 0.25 MG tablet Take 1 tablet (0.25 mg total) by mouth 3 (three) times daily as needed. No additional refills permitted  . escitalopram (LEXAPRO) 10 MG tablet Take 10 mg by mouth daily.  . frovatriptan (FROVA) 2.5 MG  tablet Take 1 tablet (2.5 mg total) by mouth as needed for migraine. If recurs, may repeat after 2 hours. Max of 3 tabs in 24 hours.  Marland Kitchen. ibuprofen (ADVIL,MOTRIN) 800 MG tablet Take 1 tablet (800 mg total) by mouth every 8 (eight) hours as needed.  Marland Kitchen. levonorgestrel (MIRENA, 52 MG,) 20 MCG/24HR IUD 1 each by Intrauterine route once.  . pantoprazole (PROTONIX) 40 MG tablet Take 1 tablet (40 mg total) by mouth daily.  Marland Kitchen. zolpidem (AMBIEN) 10 MG tablet Take 1 tablet (10 mg total) by mouth at bedtime as needed for sleep.  Marland Kitchen. doxycycline (VIBRA-TABS) 100 MG tablet Take 1 tablet (100 mg total) by mouth 2 (two) times daily.   No facility-administered encounter medications on file as of 06/19/2018.     Allergies: Compazine [prochlorperazine edisylate]  There is no height or weight on file to calculate BMI.  There were no vitals taken for this visit. Review of Systems: General:   Denies fever- feel hot +, chills, unexplained weight loss.  Optho/Auditory:   Denies visual changes, blurred vision/LOV Respiratory:   Denies SOB, DOE more than baseline levels.  Cardiovascular:   Denies chest pain, palpitations, new onset peripheral edema  Gastrointestinal:    Nausea +, vomiting -, diarrhea.  Genitourinary: Denies dysuria, freq/ urgency, flank pain or discharge from genitals.  Endocrine: Denies hot or cold intolerance, polyuria, polydipsia. Musculoskeletal:  Myalgias +, joint swelling, unexplained arthralgias, gait problems.  Skin: Rash +, Tick Bite+ Neurological: Denies dizziness, unexplained weakness, numbness  Psychiatric/Behavioral:   Denies mood changes, suicidal or homicidal ideations, hallucinations  Observations/Objective: No acute distress noted during the telephone conversation. Pt sent in photos via MyChart- reviewed.  Assessment and Plan: Doxycycline 100mg  BID x 10 days OTC Hydrocortisone to affected area Continue OTC Acetaminophen or Ibuprofen as needed. Apply ice as needed Call clinic  if sx's do not resolve after ABX course completed. Advised to avoid sun exposure when using doxycycline   Follow Up Instructions: PRN   I discussed the assessment and treatment plan with the patient. The patient was provided an opportunity to ask questions and all were answered. The patient agreed with the plan and demonstrated an understanding of the instructions.   The patient was advised to call back or seek an in-person evaluation if the symptoms worsen or if the condition fails to improve as anticipated.  I provided 12 minutes of non-face-to-face time during this encounter.   Julaine FusiKaty D Mystie Ormand, NP

## 2018-06-19 NOTE — Assessment & Plan Note (Signed)
Assessment and Plan: Doxycycline 100mg  BID x 10 days OTC Hydrocortisone to affected area Continue OTC Acetaminophen or Ibuprofen as needed. Apply ice as needed Call clinic if sx's do not resolve after ABX course completed. Advised to avoid sun exposure when using doxycycline   Follow Up Instructions: PRN   I discussed the assessment and treatment plan with the patient. The patient was provided an opportunity to ask questions and all were answered. The patient agreed with the plan and demonstrated an understanding of the instructions.   The patient was advised to call back or seek an in-person evaluation if the symptoms worsen or if the condition fails to improve as anticipated.

## 2018-06-23 ENCOUNTER — Ambulatory Visit: Payer: BLUE CROSS/BLUE SHIELD | Admitting: Adult Health

## 2018-07-08 DIAGNOSIS — M9903 Segmental and somatic dysfunction of lumbar region: Secondary | ICD-10-CM | POA: Diagnosis not present

## 2018-07-08 DIAGNOSIS — M9905 Segmental and somatic dysfunction of pelvic region: Secondary | ICD-10-CM | POA: Diagnosis not present

## 2018-07-08 DIAGNOSIS — M9902 Segmental and somatic dysfunction of thoracic region: Secondary | ICD-10-CM | POA: Diagnosis not present

## 2018-07-08 DIAGNOSIS — M9906 Segmental and somatic dysfunction of lower extremity: Secondary | ICD-10-CM | POA: Diagnosis not present

## 2018-07-15 DIAGNOSIS — M9903 Segmental and somatic dysfunction of lumbar region: Secondary | ICD-10-CM | POA: Diagnosis not present

## 2018-07-15 DIAGNOSIS — M9902 Segmental and somatic dysfunction of thoracic region: Secondary | ICD-10-CM | POA: Diagnosis not present

## 2018-07-15 DIAGNOSIS — M9906 Segmental and somatic dysfunction of lower extremity: Secondary | ICD-10-CM | POA: Diagnosis not present

## 2018-07-15 DIAGNOSIS — M9905 Segmental and somatic dysfunction of pelvic region: Secondary | ICD-10-CM | POA: Diagnosis not present

## 2018-07-28 DIAGNOSIS — M9903 Segmental and somatic dysfunction of lumbar region: Secondary | ICD-10-CM | POA: Diagnosis not present

## 2018-07-28 DIAGNOSIS — M9905 Segmental and somatic dysfunction of pelvic region: Secondary | ICD-10-CM | POA: Diagnosis not present

## 2018-07-28 DIAGNOSIS — M9902 Segmental and somatic dysfunction of thoracic region: Secondary | ICD-10-CM | POA: Diagnosis not present

## 2018-07-28 DIAGNOSIS — M9906 Segmental and somatic dysfunction of lower extremity: Secondary | ICD-10-CM | POA: Diagnosis not present

## 2018-08-09 ENCOUNTER — Telehealth: Payer: BLUE CROSS/BLUE SHIELD | Admitting: Family

## 2018-08-09 DIAGNOSIS — R300 Dysuria: Secondary | ICD-10-CM

## 2018-08-09 MED ORDER — NITROFURANTOIN MONOHYD MACRO 100 MG PO CAPS: 100.0000 mg | ORAL_CAPSULE | Freq: Two times a day (BID) | ORAL | 0 refills | Status: DC

## 2018-08-09 NOTE — Progress Notes (Signed)

## 2018-08-20 ENCOUNTER — Ambulatory Visit (HOSPITAL_COMMUNITY)
Admission: EM | Admit: 2018-08-20 | Discharge: 2018-08-20 | Disposition: A | Discharge status: Home / Self Care | Payer: BC Managed Care – PPO | Attending: Family Medicine | Admitting: Family Medicine

## 2018-08-20 ENCOUNTER — Encounter (HOSPITAL_COMMUNITY): Payer: Self-pay

## 2018-08-20 ENCOUNTER — Other Ambulatory Visit: Payer: Self-pay

## 2018-08-20 DIAGNOSIS — M62830 Muscle spasm of back: Secondary | ICD-10-CM | POA: Diagnosis not present

## 2018-08-20 LAB — POCT URINALYSIS DIP (DEVICE)
Bilirubin Urine: NEGATIVE
Glucose, UA: NEGATIVE mg/dL
Hgb urine dipstick: NEGATIVE
Ketones, ur: NEGATIVE mg/dL
Leukocytes,Ua: NEGATIVE
Nitrite: NEGATIVE
Protein, ur: NEGATIVE mg/dL
Specific Gravity, Urine: 1.02 (ref 1.005–1.030)
Urobilinogen, UA: 0.2 mg/dL (ref 0.0–1.0)
pH: 7 (ref 5.0–8.0)

## 2018-08-20 LAB — POCT PREGNANCY, URINE: Preg Test, Ur: NEGATIVE

## 2018-08-20 MED ORDER — CYCLOBENZAPRINE HCL 10 MG PO TABS: 5.0000 mg | ORAL_TABLET | Freq: Every day | ORAL | 0 refills | Status: DC

## 2018-08-20 NOTE — Discharge Instructions (Addendum)
Your urine was negative for infection, pregnancy. Not likely kidney stone.  I believe this may be a muscle spasm.  Flexeril for muscle spasm as needed. You can take aleve or ibuprofen for the pain and inflammation.  Monitor for worsening symptoms ans follow up as needed.

## 2018-08-20 NOTE — ED Triage Notes (Signed)
Pt states she having upper back pain this started last night over in the morning. Pt states it comes and goes. Pt states the pain is intense.

## 2018-08-20 NOTE — ED Provider Notes (Signed)
[facility]    CSN: [id number] Arrival date & time: 08/20/18  1001     History   Chief Complaint Chief Complaint  Patient presents with  . Appointment    1010  . Back Pain    HPI Tanya [name] is a 38 y.o. female.   Patient is a 38 year old female past medical history of ADHD, anemia, anxiety, GERD, pyelonephritis, IBS, interstitial cystitis, allergies, migraines.  She presents today with right upper back pain/flank pain that started upon waking this morning.  The pain is intermittent and comes in 10 to 15-second waves.  Describes as throbbing when it comes.  She has not taken anything for her symptoms.  She denies any associated fever, chills, nausea, vomiting, dysuria, hematuria or urinary frequency.  Denies any cough or chest congestion.  ROS per HPI      Past Medical History:  Diagnosis Date  . ADHD (attention deficit hyperactivity disorder)   . Anemia    postpartum  . Anxiety   . GERD (gastroesophageal reflux disease)   . History of chicken pox   . History of pyelonephritis    as child  . IBS (irritable bowel syndrome)   . Infection    UTI  . Interstitial cystitis   . Migraines   . Seasonal allergies   . Tachycardia   . Tilted uterus     Patient Active Problem List   Diagnosis Date Noted  . Insect bite of right shoulder 06/19/2018  . Localized edema 05/19/2018  . Numbness and tingling of left leg 05/19/2018  . Insomnia 05/19/2018  . BMI 31.0-31.9,adult 01/22/2018  . Healthcare maintenance 12/24/2017  . GERD (gastroesophageal reflux disease) 12/24/2017  . Anemia 12/24/2017  . GAD (generalized anxiety disorder) 12/24/2017  . Migraine with aura and without status migrainosus, not intractable 02/01/2017  . S/P cesarean section 08/07/2015  . Placenta previa antepartum 08/03/2015  . TACHYCARDIA 06/07/2008  . DYSPNEA 06/07/2008    Past Surgical History:  Procedure Laterality Date  . CESAREAN SECTION N/A 08/07/2015   Procedure: CESAREAN  SECTION;  Surgeon: Marcelle OverlieMichelle Grewal, MD;  [facility];  Service: Obstetrics;  Laterality: N/A;  . CESAREAN SECTION    . dilate and curettage  2016  . TUBAL LIGATION    . Tubiligation      OB History    Gravida  5   Para  3   Term  2   Preterm  1   AB  2   Living  3     SAB  2   TAB      Ectopic      Multiple  0   Live Births  3            Home Medications    Prior to Admission medications   Medication Sig Start Date End Date Taking? Authorizing Provider  ALPRAZolam (XANAX) 0.25 MG tablet Take 1 tablet (0.25 mg total) by mouth 3 (three) times daily as needed. No additional refills permitted 04/28/18   Danford, Orpha BurKaty D, NP  cyclobenzaprine (FLEXERIL) 10 MG tablet Take 0.5 tablets (5 mg total) by mouth at bedtime. 08/20/18   Dahlia ByesBast, Zanyla Klebba A, NP  doxycycline (VIBRA-TABS) 100 MG tablet Take 1 tablet (100 mg total) by mouth 2 (two) times daily. 06/19/18   Danford, Orpha BurKaty D, NP  escitalopram (LEXAPRO) 10 MG tablet Take 10 mg by mouth daily. 12/04/17   [provider]  frovatriptan (FROVA) 2.5 MG tablet Take 1 tablet (2.5 mg total)  by mouth as needed for migraine. If recurs, may repeat after 2 hours. Max of 3 tabs in 24 hours. 03/17/18   Lomax, Amy, NP  ibuprofen (ADVIL,MOTRIN) 800 MG tablet Take 1 tablet (800 mg total) by mouth every 8 (eight) hours as needed. 05/20/18   Vivi BarrackWagoner, Matthew R, DPM  levonorgestrel (MIRENA, 52 MG,) 20 MCG/24HR IUD 1 each by Intrauterine route once.    [provider]  pantoprazole (PROTONIX) 40 MG tablet Take 1 tablet (40 mg total) by mouth daily. 06/16/18   Mansouraty, Netty StarringGabriel Jr., MD  zolpidem (AMBIEN) 10 MG tablet Take 1 tablet (10 mg total) by mouth at bedtime as needed for sleep. 05/19/18   Julaine Fusianford, Katy D, NP    Family History Family History  Problem Relation Age of Onset  . Hypertension Mother   . Hypothyroidism Mother   . Thyroid disease Mother   . Other Mother        benign brain tumor  . Depression Mother   .  Hyperlipidemia Mother   . Cancer Maternal Grandfather        breast  . Heart attack Maternal Grandfather   . Hyperlipidemia Maternal Grandfather   . Hypertension Maternal Grandfather   . Breast cancer Maternal Grandfather   . Lupus Paternal Grandmother   . Diabetes Paternal Grandfather     Social History Social History   Tobacco Use  . Smoking status: Never Smoker  . Smokeless tobacco: Never Used  Substance Use Topics  . Alcohol use: Yes    Alcohol/week: 2.0 standard drinks    Types: 2 Glasses of wine per week    Comment: 1-2 per week  . Drug use: No     Allergies   Compazine [prochlorperazine edisylate]   Review of Systems Review of Systems   Physical Exam Triage Vital Signs ED Triage Vitals  Enc Vitals Group     BP 08/20/18 1033 102/79     Pulse Rate 08/20/18 1033 98     Resp 08/20/18 1033 18     Temp 08/20/18 1033 98.7 F (37.1 C)     Temp Source 08/20/18 1033 Oral     SpO2 08/20/18 1033 98 %     Weight 08/20/18 1031 195 lb (88.5 kg)     Height --      Head Circumference --      Peak Flow --      Pain Score 08/20/18 1030 8     Pain Loc --      Pain Edu? --      Excl. in GC? --    No data found.  Updated Vital Signs BP 102/79 (BP Location: Right Arm)   Pulse 98   Temp 98.7 F (37.1 C) (Oral)   Resp 18   Wt 195 lb (88.5 kg)   SpO2 98%   BMI 30.54 kg/m   Visual Acuity Right Eye Distance:   Left Eye Distance:   Bilateral Distance:    Right Eye Near:   Left Eye Near:    Bilateral Near:     Physical Exam Vitals signs and nursing note reviewed.  Constitutional:      General: She is not in acute distress.    Appearance: Normal appearance. She is not ill-appearing, toxic-appearing or diaphoretic.  HENT:     Head: Normocephalic and atraumatic.     Nose: Nose normal.  Eyes:     Conjunctiva/sclera: Conjunctivae normal.  Neck:     Musculoskeletal: Normal range of motion.  Pulmonary:  Effort: Pulmonary effort is normal.   Musculoskeletal: Normal range of motion.     Thoracic back: She exhibits swelling. She exhibits no deformity.       Back:     Comments: Mildly tender to deep palpation of the thoracic paravertebral musculature on the right side. No rashes  Skin:    General: Skin is warm and dry.     Findings: No rash.  Neurological:     Mental Status: She is alert.      UC Treatments / Results  Labs (all labs ordered are listed, but only abnormal results are displayed) Labs Reviewed  POC URINE PREG, ED  POCT URINALYSIS DIP (DEVICE)  POCT PREGNANCY, URINE    EKG   Radiology No results found.  Procedures Procedures (including critical care time)  Medications Ordered in UC Medications - No data to display  Initial Impression / Assessment and Plan / UC Course  I have reviewed the triage vital signs and the nursing notes.  Pertinent labs & imaging results that were available during my care of the patient were reviewed by me and considered in my medical decision making (see chart for details).     Urine negative for infection and pregnancy No trace blood so not likely kidney stone.  Most likely muscle strain or spasm.  Flexeril for muscle spasm and Aleve or ibuprofen as needed for pain inflammation Follow up as needed for continued or worsening symptoms  Final Clinical Impressions(s) / UC Diagnoses   Final diagnoses:  Muscle spasm of back     Discharge Instructions     Your urine was negative for infection, pregnancy. Not likely kidney stone.  I believe this may be a muscle spasm.  Flexeril for muscle spasm as needed. You can take aleve or ibuprofen for the pain and inflammation.  Monitor for worsening symptoms ans follow up as needed.     ED Prescriptions    Medication Sig Dispense Auth. Provider   cyclobenzaprine (FLEXERIL) 10 MG tablet Take 0.5 tablets (5 mg total) by mouth at bedtime. 20 tablet Loura Halt A, NP     Controlled Substance Prescriptions [place] Controlled  Substance Registry consulted? Not Applicable   Orvan July, NP 08/20/18 1139

## 2018-08-25 DIAGNOSIS — M9903 Segmental and somatic dysfunction of lumbar region: Secondary | ICD-10-CM | POA: Diagnosis not present

## 2018-08-25 DIAGNOSIS — M9902 Segmental and somatic dysfunction of thoracic region: Secondary | ICD-10-CM | POA: Diagnosis not present

## 2018-08-25 DIAGNOSIS — M9905 Segmental and somatic dysfunction of pelvic region: Secondary | ICD-10-CM | POA: Diagnosis not present

## 2018-08-25 DIAGNOSIS — M9906 Segmental and somatic dysfunction of lower extremity: Secondary | ICD-10-CM | POA: Diagnosis not present

## 2018-09-02 DIAGNOSIS — M9905 Segmental and somatic dysfunction of pelvic region: Secondary | ICD-10-CM | POA: Diagnosis not present

## 2018-09-02 DIAGNOSIS — M9902 Segmental and somatic dysfunction of thoracic region: Secondary | ICD-10-CM | POA: Diagnosis not present

## 2018-09-02 DIAGNOSIS — M9903 Segmental and somatic dysfunction of lumbar region: Secondary | ICD-10-CM | POA: Diagnosis not present

## 2018-09-02 DIAGNOSIS — M9906 Segmental and somatic dysfunction of lower extremity: Secondary | ICD-10-CM | POA: Diagnosis not present

## 2018-09-10 DIAGNOSIS — M9906 Segmental and somatic dysfunction of lower extremity: Secondary | ICD-10-CM | POA: Diagnosis not present

## 2018-09-10 DIAGNOSIS — M9902 Segmental and somatic dysfunction of thoracic region: Secondary | ICD-10-CM | POA: Diagnosis not present

## 2018-09-10 DIAGNOSIS — M9903 Segmental and somatic dysfunction of lumbar region: Secondary | ICD-10-CM | POA: Diagnosis not present

## 2018-09-10 DIAGNOSIS — M9905 Segmental and somatic dysfunction of pelvic region: Secondary | ICD-10-CM | POA: Diagnosis not present

## 2018-09-16 DIAGNOSIS — M9905 Segmental and somatic dysfunction of pelvic region: Secondary | ICD-10-CM | POA: Diagnosis not present

## 2018-09-16 DIAGNOSIS — M9903 Segmental and somatic dysfunction of lumbar region: Secondary | ICD-10-CM | POA: Diagnosis not present

## 2018-09-16 DIAGNOSIS — M9906 Segmental and somatic dysfunction of lower extremity: Secondary | ICD-10-CM | POA: Diagnosis not present

## 2018-09-16 DIAGNOSIS — M9902 Segmental and somatic dysfunction of thoracic region: Secondary | ICD-10-CM | POA: Diagnosis not present

## 2018-09-18 ENCOUNTER — Encounter: Payer: Self-pay | Admitting: Adult Health

## 2018-09-18 ENCOUNTER — Other Ambulatory Visit: Payer: Self-pay

## 2018-09-18 ENCOUNTER — Ambulatory Visit: Payer: BC Managed Care – PPO | Admitting: Adult Health

## 2018-09-18 VITALS — BP 118/77 | HR 87 | Temp 97.5°F | Ht 67.0 in | Wt 202.2 lb

## 2018-09-18 DIAGNOSIS — G43109 Migraine with aura, not intractable, without status migrainosus: Secondary | ICD-10-CM | POA: Diagnosis not present

## 2018-09-18 DIAGNOSIS — R413 Other amnesia: Secondary | ICD-10-CM | POA: Diagnosis not present

## 2018-09-18 NOTE — Patient Instructions (Signed)
Your Plan:  Continue Frova as needed for migraine Blood work today for memory issues If your symptoms worsen or you develop new symptoms please let us know.    Thank you for coming to see [place] at [facility]. I hope we have been able to provide you high quality care today.  You may receive a patient satisfaction survey over the next few weeks. We would appreciate your feedback and comments so that we may continue to improve ourselves and the health of our patients.

## 2018-09-18 NOTE — Progress Notes (Signed)
PATIENT: Tanya [name] DOB: 28-Oct-1980  REASON FOR VISIT: follow up HISTORY FROM: patient  HISTORY OF PRESENT ILLNESS: Today 09/18/18: [name] is a 38 year old female with a history of migraine headaches.  She returns today for follow-up.  She states that her headaches have been under relatively good control.  She states that she has not had a headache in the last 2 months.  She states that she has used Frova and found it beneficial.  She does note that her cycle is slightly irregular.  It can start anywhere between the 24 to 27-day mark.  She states that this makes it hard to take Frova to prevent the migraines.  But she does state that she has taken it on the first day and it prevented a headache the rest of the week.  She also states that she noticed trouble with her memory.  She has trouble remembering names of people that she should know.  She also has made mistakes with her finances.  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  She denies any trouble preparing meals.  She is able to complete all ADLs independently.  She states that she has been on Ambien for years.  She is unsure if this is affecting her memory.   HISTORY Tanya [name] is a 38 y.o. female with history of migraines with aura here today for follow up. She reports that she is feeling well. She has noticed that she continues to have 1-3 migraines around her menstrual cycle. She had Mirena placed in 11/2017. She has not correlated worsening of headaches since. She has not had an aura with headache in over a year. She is taking Lexapro daily for anxiety. She uses Maxalt as needed for headaches but feels it may wear off to soon. She is asking to try Frova as discussed in previous visit. She is also taking meloxicam daily for an ankle injury.  REVIEW OF SYSTEMS: Out of a complete 14 system review of symptoms, the patient complains only of the following symptoms, and all other reviewed systems are  negative.  See HPI  ALLERGIES: Allergies  Allergen Reactions   Compazine [Prochlorperazine Edisylate] Other (See Comments)    HOME MEDICATIONS: Outpatient Medications Prior to Visit  Medication Sig Dispense Refill   escitalopram (LEXAPRO) 10 MG tablet Take 10 mg by mouth daily.  12   frovatriptan (FROVA) 2.5 MG tablet Take 1 tablet (2.5 mg total) by mouth as needed for migraine. If recurs, may repeat after 2 hours. Max of 3 tabs in 24 hours. 10 tablet 0   ibuprofen (ADVIL,MOTRIN) 800 MG tablet Take 1 tablet (800 mg total) by mouth every 8 (eight) hours as needed. 30 tablet 0   levonorgestrel (MIRENA, 52 MG,) 20 MCG/24HR IUD 1 each by Intrauterine route once.     pantoprazole (PROTONIX) 40 MG tablet Take 1 tablet (40 mg total) by mouth daily. 30 tablet 2   zolpidem (AMBIEN) 10 MG tablet Take 1 tablet (10 mg total) by mouth at bedtime as needed for sleep. 30 tablet 0   ALPRAZolam (XANAX) 0.25 MG tablet Take 1 tablet (0.25 mg total) by mouth 3 (three) times daily as needed. No additional refills permitted 30 tablet 0   cyclobenzaprine (FLEXERIL) 10 MG tablet Take 0.5 tablets (5 mg total) by mouth at bedtime. 20 tablet 0   doxycycline (VIBRA-TABS) 100 MG tablet Take 1 tablet (100 mg total) by mouth 2 (two) times daily. 20  tablet 0   No facility-administered medications prior to visit.     PAST MEDICAL HISTORY: Past Medical History:  Diagnosis Date   ADHD (attention deficit hyperactivity disorder)    Anemia    postpartum   Anxiety    GERD (gastroesophageal reflux disease)    History of chicken pox    History of pyelonephritis    as child   IBS (irritable bowel syndrome)    Infection    UTI   Interstitial cystitis    Migraines    Seasonal allergies    Tachycardia    Tilted uterus     PAST SURGICAL HISTORY: Past Surgical History:  Procedure Laterality Date   CESAREAN SECTION N/A 08/07/2015   Procedure: CESAREAN SECTION;  Surgeon: Marcelle OverlieMichelle Grewal, MD;   [facility];  Service: Obstetrics;  Laterality: N/A;   CESAREAN SECTION     dilate and curettage  2016   TUBAL LIGATION     Tubiligation      FAMILY HISTORY: Family History  Problem Relation Age of Onset   Hypertension Mother    Hypothyroidism Mother    Thyroid disease Mother    Other Mother        benign brain tumor   Depression Mother    Hyperlipidemia Mother    Cancer Maternal Grandfather        breast   Heart attack Maternal Grandfather    Hyperlipidemia Maternal Grandfather    Hypertension Maternal Grandfather    Breast cancer Maternal Grandfather    Lupus Paternal Grandmother    Diabetes Paternal Grandfather     SOCIAL HISTORY: Social History   Socioeconomic History   Marital status: Married    Spouse name: Not on file   Number of children: 3   Years of education: college   Highest education level: Not on file  Occupational History    Comment: Museum/gallery exhibitions officerCPA  Social Needs   Financial resource strain: Not on file   Food insecurity    Worry: Not on file    Inability: Not on file   Transportation needs    Medical: Not on file    Non-medical: Not on file  Tobacco Use   Smoking status: Never Smoker   Smokeless tobacco: Never Used  Substance and Sexual Activity   Alcohol use: Yes    Alcohol/week: 2.0 standard drinks    Types: 2 Glasses of wine per week    Comment: 1-2 per week   Drug use: No   Sexual activity: Yes    Birth control/protection: Surgical  Lifestyle   Physical activity    Days per week: Not on file    Minutes per session: Not on file   Stress: Not on file  Relationships   Social connections    Talks on phone: Not on file    Gets together: Not on file    Attends religious service: Not on file    Active member of club or organization: Not on file    Attends meetings of clubs or organizations: Not on file    Relationship status: Not on file   Intimate partner violence    Fear of current or ex  partner: Not on file    Emotionally abused: Not on file    Physically abused: Not on file    Forced sexual activity: Not on file  Other Topics Concern   Not on file  Social History Narrative   Lives at home with spouse, August SaucerDean, and 3 kids.  Works at  [employer].  Education: college.  Caffeine 1-2 per day.       PHYSICAL EXAM  Vitals:   09/18/18 0754  BP: 118/77  Pulse: 87  Temp: (!) 97.5 F (36.4 C)  Weight: 202 lb 3.2 oz (91.7 kg)  Height: 5\' 7"  (1.702 m)   Body mass index is 31.67 kg/m.   Montreal Cognitive Assessment  09/18/2018  Visuospatial/ Executive (0/5) 5  Naming (0/3) 3  Attention: Read list of digits (0/2) 2  Attention: Read list of letters (0/1) 1  Attention: Serial 7 subtraction starting at 100 (0/3) 2  Language: Repeat phrase (0/2) 2  Language : Fluency (0/1) 1  Abstraction (0/2) 2  Delayed Recall (0/5) 3  Orientation (0/6) 6  Total 27  Adjusted Score (based on education) 28     Generalized: Well developed, in no acute distress   Neurological examination  Mentation: Alert oriented to time, place, history taking. Follows all commands speech and language fluent Cranial nerve II-XII: Extraocular movements were full, visual field were full on confrontational test.Head turning and shoulder shrug  were normal and symmetric. Motor: The motor testing reveals 5 over 5 strength of all 4 extremities. Good symmetric motor tone is noted throughout.  Sensory: Sensory testing is intact to soft touch on all 4 extremities. No evidence of extinction is noted.  Coordination: Cerebellar testing reveals good finger-nose-finger and heel-to-shin bilaterally.  Gait and station: Gait is normal.  Reflexes: Deep tendon reflexes are symmetric and normal bilaterally.   DIAGNOSTIC DATA (LABS, IMAGING, TESTING) - I reviewed patient records, labs, notes, testing and imaging myself where available.  Lab Results  Component Value Date   WBC 6.3 01/16/2018   HGB 14.4  01/16/2018   HCT 44.0 01/16/2018   MCV 89 01/16/2018   PLT 254 01/16/2018      Component Value Date/Time   NA 139 05/19/2018 1451   K 4.8 05/19/2018 1451   CL 100 05/19/2018 1451   CO2 25 05/19/2018 1451   GLUCOSE 84 05/19/2018 1451   GLUCOSE 122 (H) 11/15/2017 0903   BUN 14 05/19/2018 1451   CREATININE 0.84 05/19/2018 1451   CREATININE 0.78 04/23/2013 0916   CALCIUM 9.4 05/19/2018 1451   PROT 6.8 05/19/2018 1451   ALBUMIN 4.5 05/19/2018 1451   AST 19 05/19/2018 1451   ALT 16 05/19/2018 1451   ALKPHOS 59 05/19/2018 1451   BILITOT 0.3 05/19/2018 1451   GFRNONAA 88 05/19/2018 1451   GFRAA 101 05/19/2018 1451   Lab Results  Component Value Date   CHOL 181 01/16/2018   HDL 57 01/16/2018   LDLCALC 109 (H) 01/16/2018   TRIG 73 01/16/2018   CHOLHDL 3.2 01/16/2018   Lab Results  Component Value Date   HGBA1C 5.1 01/16/2018   No results found for: VITAMINB12 Lab Results  Component Value Date   TSH 2.520 01/16/2018      ASSESSMENT AND PLAN 38 y.o. year old female  has a past medical history of ADHD (attention deficit hyperactivity disorder), Anemia, Anxiety, GERD (gastroesophageal reflux disease), History of chicken pox, History of pyelonephritis, IBS (irritable bowel syndrome), Infection, Interstitial cystitis, Migraines, Seasonal allergies, Tachycardia, and Tilted uterus. here with:  1.  Migraine headaches 2.  Memory disturbance  Overall the patient is doing well with her headaches.  She will continue on Frova if needed.  The patient has note some issues with her memory.  MOCA is 28/30 .  I will check blood work to evaluate for memory issues.  I have advised that if her symptoms worsen or she develops new symptoms she should let us know.  She will follow-up in 6 months or sooner if needed.     Ward Givens, MSN, NP-C 09/18/2018, 8:15 AM [facility] [address] [place], [place] [address] [phone]

## 2018-09-20 ENCOUNTER — Other Ambulatory Visit: Payer: Self-pay | Admitting: Gastroenterology

## 2018-09-20 LAB — METHYLMALONIC ACID, SERUM: Methylmalonic Acid: 131 nmol/L (ref 0–378)

## 2018-09-20 LAB — TSH: TSH: 2.11 u[IU]/mL (ref 0.450–4.500)

## 2018-09-20 LAB — VITAMIN B12: Vitamin B-12: 399 pg/mL (ref 232–1245)

## 2018-09-22 ENCOUNTER — Telehealth: Payer: Self-pay | Admitting: *Deleted

## 2018-09-22 NOTE — Telephone Encounter (Signed)
-----   Message from Ward Givens, NP sent at 09/22/2018  8:54 AM EDT ----- Labs results are unremarkable. Please call patient with results.

## 2018-09-22 NOTE — Telephone Encounter (Signed)
Results per my chart message.

## 2018-09-29 DIAGNOSIS — M9902 Segmental and somatic dysfunction of thoracic region: Secondary | ICD-10-CM | POA: Diagnosis not present

## 2018-09-29 DIAGNOSIS — M9906 Segmental and somatic dysfunction of lower extremity: Secondary | ICD-10-CM | POA: Diagnosis not present

## 2018-09-29 DIAGNOSIS — M9905 Segmental and somatic dysfunction of pelvic region: Secondary | ICD-10-CM | POA: Diagnosis not present

## 2018-09-29 DIAGNOSIS — M9903 Segmental and somatic dysfunction of lumbar region: Secondary | ICD-10-CM | POA: Diagnosis not present

## 2018-10-01 NOTE — Progress Notes (Signed)
I reviewed note and agree with plan.   Penni Bombard, MD 3/57/8978, 4:78 PM Certified in Neurology, Neurophysiology and Neuroimaging  [facility] [address], [place], [place] [address] [phone]

## 2018-10-13 DIAGNOSIS — M9903 Segmental and somatic dysfunction of lumbar region: Secondary | ICD-10-CM | POA: Diagnosis not present

## 2018-10-13 DIAGNOSIS — M9905 Segmental and somatic dysfunction of pelvic region: Secondary | ICD-10-CM | POA: Diagnosis not present

## 2018-10-13 DIAGNOSIS — M9906 Segmental and somatic dysfunction of lower extremity: Secondary | ICD-10-CM | POA: Diagnosis not present

## 2018-10-13 DIAGNOSIS — M9902 Segmental and somatic dysfunction of thoracic region: Secondary | ICD-10-CM | POA: Diagnosis not present

## 2018-10-27 DIAGNOSIS — M9905 Segmental and somatic dysfunction of pelvic region: Secondary | ICD-10-CM | POA: Diagnosis not present

## 2018-10-27 DIAGNOSIS — M9906 Segmental and somatic dysfunction of lower extremity: Secondary | ICD-10-CM | POA: Diagnosis not present

## 2018-10-27 DIAGNOSIS — M9902 Segmental and somatic dysfunction of thoracic region: Secondary | ICD-10-CM | POA: Diagnosis not present

## 2018-10-27 DIAGNOSIS — M9903 Segmental and somatic dysfunction of lumbar region: Secondary | ICD-10-CM | POA: Diagnosis not present

## 2018-10-31 DIAGNOSIS — R309 Painful micturition, unspecified: Secondary | ICD-10-CM | POA: Diagnosis not present

## 2018-10-31 DIAGNOSIS — N3289 Other specified disorders of bladder: Secondary | ICD-10-CM | POA: Diagnosis not present

## 2018-11-03 DIAGNOSIS — M9902 Segmental and somatic dysfunction of thoracic region: Secondary | ICD-10-CM | POA: Diagnosis not present

## 2018-11-03 DIAGNOSIS — M9906 Segmental and somatic dysfunction of lower extremity: Secondary | ICD-10-CM | POA: Diagnosis not present

## 2018-11-03 DIAGNOSIS — M9905 Segmental and somatic dysfunction of pelvic region: Secondary | ICD-10-CM | POA: Diagnosis not present

## 2018-11-03 DIAGNOSIS — M9903 Segmental and somatic dysfunction of lumbar region: Secondary | ICD-10-CM | POA: Diagnosis not present

## 2018-11-11 DIAGNOSIS — M9902 Segmental and somatic dysfunction of thoracic region: Secondary | ICD-10-CM | POA: Diagnosis not present

## 2018-11-11 DIAGNOSIS — M9903 Segmental and somatic dysfunction of lumbar region: Secondary | ICD-10-CM | POA: Diagnosis not present

## 2018-11-11 DIAGNOSIS — M9906 Segmental and somatic dysfunction of lower extremity: Secondary | ICD-10-CM | POA: Diagnosis not present

## 2018-11-11 DIAGNOSIS — M9905 Segmental and somatic dysfunction of pelvic region: Secondary | ICD-10-CM | POA: Diagnosis not present

## 2018-11-25 DIAGNOSIS — M9906 Segmental and somatic dysfunction of lower extremity: Secondary | ICD-10-CM | POA: Diagnosis not present

## 2018-11-25 DIAGNOSIS — M9903 Segmental and somatic dysfunction of lumbar region: Secondary | ICD-10-CM | POA: Diagnosis not present

## 2018-11-25 DIAGNOSIS — M9905 Segmental and somatic dysfunction of pelvic region: Secondary | ICD-10-CM | POA: Diagnosis not present

## 2018-11-25 DIAGNOSIS — M9902 Segmental and somatic dysfunction of thoracic region: Secondary | ICD-10-CM | POA: Diagnosis not present

## 2018-12-09 DIAGNOSIS — M9906 Segmental and somatic dysfunction of lower extremity: Secondary | ICD-10-CM | POA: Diagnosis not present

## 2018-12-09 DIAGNOSIS — M9902 Segmental and somatic dysfunction of thoracic region: Secondary | ICD-10-CM | POA: Diagnosis not present

## 2018-12-09 DIAGNOSIS — M9905 Segmental and somatic dysfunction of pelvic region: Secondary | ICD-10-CM | POA: Diagnosis not present

## 2018-12-09 DIAGNOSIS — M9903 Segmental and somatic dysfunction of lumbar region: Secondary | ICD-10-CM | POA: Diagnosis not present

## 2018-12-18 DIAGNOSIS — M9902 Segmental and somatic dysfunction of thoracic region: Secondary | ICD-10-CM | POA: Diagnosis not present

## 2018-12-18 DIAGNOSIS — M9905 Segmental and somatic dysfunction of pelvic region: Secondary | ICD-10-CM | POA: Diagnosis not present

## 2018-12-18 DIAGNOSIS — M9906 Segmental and somatic dysfunction of lower extremity: Secondary | ICD-10-CM | POA: Diagnosis not present

## 2018-12-18 DIAGNOSIS — M9903 Segmental and somatic dysfunction of lumbar region: Secondary | ICD-10-CM | POA: Diagnosis not present

## 2018-12-23 DIAGNOSIS — M9905 Segmental and somatic dysfunction of pelvic region: Secondary | ICD-10-CM | POA: Diagnosis not present

## 2018-12-23 DIAGNOSIS — M9902 Segmental and somatic dysfunction of thoracic region: Secondary | ICD-10-CM | POA: Diagnosis not present

## 2018-12-23 DIAGNOSIS — M9906 Segmental and somatic dysfunction of lower extremity: Secondary | ICD-10-CM | POA: Diagnosis not present

## 2018-12-23 DIAGNOSIS — N939 Abnormal uterine and vaginal bleeding, unspecified: Secondary | ICD-10-CM | POA: Diagnosis not present

## 2018-12-23 DIAGNOSIS — M9903 Segmental and somatic dysfunction of lumbar region: Secondary | ICD-10-CM | POA: Diagnosis not present

## 2018-12-27 ENCOUNTER — Other Ambulatory Visit: Payer: Self-pay | Admitting: *Deleted

## 2018-12-27 DIAGNOSIS — Z20828 Contact with and (suspected) exposure to other viral communicable diseases: Secondary | ICD-10-CM

## 2018-12-27 DIAGNOSIS — Z20822 Contact with and (suspected) exposure to covid-19: Secondary | ICD-10-CM

## 2018-12-29 LAB — NOVEL CORONAVIRUS, NAA: SARS-CoV-2, NAA: DETECTED — AB

## 2018-12-30 ENCOUNTER — Telehealth: Payer: Self-pay | Admitting: Critical Care Medicine

## 2018-12-30 ENCOUNTER — Encounter: Payer: Self-pay | Admitting: Adult Health

## 2018-12-30 NOTE — Telephone Encounter (Signed)
The patient is aware of her positive Covid test from November 14.  She began having symptoms that same day of headache fatigue and nasal stuffiness.  She is improving today with the symptoms.  She understands and knows she needs to stay in isolation until November 24.  She knows the health department may be in touch.  She is also going to be in touch with her primary care provider we will forward the results of this test to her primary care provider.

## 2018-12-30 NOTE — Telephone Encounter (Signed)
-----   Message from Jefferson Fuel, [place] sent at 12/30/2018  7:44 AM EST ----- Critical lab result Patient has not been contacted

## 2018-12-31 ENCOUNTER — Telehealth: Payer: Self-pay

## 2018-12-31 NOTE — Telephone Encounter (Signed)
Received a fax from [facility] where the patient is requesting a refill on frovatriptan (FROVA) 2.5 MG tablet

## 2018-12-31 NOTE — Telephone Encounter (Signed)
Looks like Tanya [name] saw her last in 09/2018. I don't see any reason not to refill but check with her.

## 2018-12-31 NOTE — Telephone Encounter (Signed)
Roger that. Tanya [name]

## 2019-01-01 ENCOUNTER — Other Ambulatory Visit: Payer: Self-pay | Admitting: *Deleted

## 2019-01-01 ENCOUNTER — Other Ambulatory Visit: Payer: Self-pay | Admitting: Adult Health

## 2019-01-01 ENCOUNTER — Encounter: Payer: Self-pay | Admitting: Adult Health

## 2019-01-01 MED ORDER — ONDANSETRON HCL 8 MG PO TABS: 8.0000 mg | ORAL_TABLET | Freq: Three times a day (TID) | ORAL | 0 refills | Status: DC | PRN

## 2019-01-01 MED ORDER — FROVATRIPTAN SUCCINATE 2.5 MG PO TABS: 2.5000 mg | ORAL_TABLET | ORAL | 1 refills | Status: DC | PRN

## 2019-01-04 ENCOUNTER — Other Ambulatory Visit: Payer: Self-pay

## 2019-01-04 DIAGNOSIS — Z20822 Contact with and (suspected) exposure to covid-19: Secondary | ICD-10-CM

## 2019-01-05 LAB — NOVEL CORONAVIRUS, NAA: SARS-CoV-2, NAA: DETECTED — AB

## 2019-01-06 ENCOUNTER — Emergency Department (HOSPITAL_COMMUNITY)
Admission: EM | Admit: 2019-01-06 | Discharge: 2019-01-06 | Disposition: A | Discharge status: Home / Self Care | Payer: BC Managed Care – PPO | Attending: Emergency Medicine | Admitting: Emergency Medicine

## 2019-01-06 ENCOUNTER — Emergency Department (HOSPITAL_COMMUNITY): Payer: BC Managed Care – PPO

## 2019-01-06 ENCOUNTER — Encounter (HOSPITAL_COMMUNITY): Payer: Self-pay | Admitting: Emergency Medicine

## 2019-01-06 ENCOUNTER — Telehealth: Payer: Self-pay | Admitting: Adult Health

## 2019-01-06 ENCOUNTER — Other Ambulatory Visit: Payer: Self-pay

## 2019-01-06 DIAGNOSIS — Z8709 Personal history of other diseases of the respiratory system: Secondary | ICD-10-CM | POA: Diagnosis not present

## 2019-01-06 DIAGNOSIS — Z79899 Other long term (current) drug therapy: Secondary | ICD-10-CM | POA: Insufficient documentation

## 2019-01-06 DIAGNOSIS — R0789 Other chest pain: Secondary | ICD-10-CM | POA: Insufficient documentation

## 2019-01-06 DIAGNOSIS — R079 Chest pain, unspecified: Secondary | ICD-10-CM | POA: Diagnosis not present

## 2019-01-06 DIAGNOSIS — R10815 Periumbilic abdominal tenderness: Secondary | ICD-10-CM | POA: Insufficient documentation

## 2019-01-06 DIAGNOSIS — U071 COVID-19: Secondary | ICD-10-CM | POA: Diagnosis not present

## 2019-01-06 DIAGNOSIS — F909 Attention-deficit hyperactivity disorder, unspecified type: Secondary | ICD-10-CM | POA: Diagnosis not present

## 2019-01-06 HISTORY — DX: COVID-19: U07.1

## 2019-01-06 LAB — CBC WITH DIFFERENTIAL/PLATELET
Abs Immature Granulocytes: 0.03 10*3/uL (ref 0.00–0.07)
Basophils Absolute: 0 10*3/uL (ref 0.0–0.1)
Basophils Relative: 0 %
Eosinophils Absolute: 0.1 10*3/uL (ref 0.0–0.5)
Eosinophils Relative: 1 %
HCT: 42.7 % (ref 36.0–46.0)
Hemoglobin: 14.1 g/dL (ref 12.0–15.0)
Immature Granulocytes: 0 %
Lymphocytes Relative: 26 %
Lymphs Abs: 2.1 10*3/uL (ref 0.7–4.0)
MCH: 29.6 pg (ref 26.0–34.0)
MCHC: 33 g/dL (ref 30.0–36.0)
MCV: 89.7 fL (ref 80.0–100.0)
Monocytes Absolute: 0.6 10*3/uL (ref 0.1–1.0)
Monocytes Relative: 8 %
Neutro Abs: 5.4 10*3/uL (ref 1.7–7.7)
Neutrophils Relative %: 65 %
Platelets: 234 10*3/uL (ref 150–400)
RBC: 4.76 MIL/uL (ref 3.87–5.11)
RDW: 11.9 % (ref 11.5–15.5)
WBC: 8.2 10*3/uL (ref 4.0–10.5)
nRBC: 0 % (ref 0.0–0.2)

## 2019-01-06 LAB — I-STAT BETA HCG BLOOD, ED (MC, WL, AP ONLY): I-stat hCG, quantitative: <5 m[IU]/mL (ref <5)

## 2019-01-06 LAB — BASIC METABOLIC PANEL
Anion gap: 8 (ref 5–15)
BUN: 17 mg/dL (ref 6–20)
CO2: 27 mmol/L (ref 22–32)
Calcium: 9.3 mg/dL (ref 8.9–10.3)
Chloride: 104 mmol/L (ref 98–111)
Creatinine, Ser: 0.93 mg/dL (ref 0.44–1.00)
GFR calc Af Amer: >60 mL/min (ref >60)
GFR calc non Af Amer: >60 mL/min (ref >60)
Glucose, Bld: 109 mg/dL — ABNORMAL HIGH (ref 70–99)
Potassium: 4.3 mmol/L (ref 3.5–5.1)
Sodium: 139 mmol/L (ref 135–145)

## 2019-01-06 LAB — TROPONIN I (HIGH SENSITIVITY): Troponin I (High Sensitivity): <2 ng/L (ref <18)

## 2019-01-06 NOTE — Telephone Encounter (Signed)
Patient called during lunch stating that she has been COVID positive for the past couple weeks and is now dealing with chest tightness and wishes to speak with the clinic staff. She can be reached at [phone]

## 2019-01-06 NOTE — Discharge Instructions (Signed)
Your work-up today was very reassuring.  Please follow-up with your PCP (over-the-phone or video) regarding today's visit.  Please return to the ED or seek medical attention for any prolonged tachycardia, increased work of breathing, rapid respirations, hypoxia, new or worsening chest pain, or any other new or worsening symptoms.  Continue to isolate and take normal COVID-19 precautions.

## 2019-01-06 NOTE — ED Triage Notes (Signed)
Pt st's she and her whole family tested + for Covid 19 approx 2 weeks ago.  Pt st's she developed mid upper chest pain earlier today.  Pt denies any cough or shortness of breath.

## 2019-01-06 NOTE — Telephone Encounter (Signed)
Good Afternoon Tonya, Please advise Ms. Michaelson to be seen at [facility] ED for worsening sx's, most concerning chest tightness. Thanks! Valetta Fuller

## 2019-01-06 NOTE — ED Provider Notes (Addendum)
[facility] EMERGENCY DEPARTMENT Provider Note   CSN: [id number] Arrival date & time: 01/06/19  1646     History   Chief Complaint Chief Complaint  Patient presents with  . Covid Positive  . Chest Pain    HPI Tanya [name] is a 38 y.o. female with PMH significant for migraine with aura, GERD, anxiety, and positive COVID-19 diagnosis 10 days ago who presents to the ED with 5 out of 10 nonradiating "pressure-like" central chest discomfort.  She reports that her chest pain was provoked by exertion and lasted approximately 3 hours before ultimately subsiding.  She reports that she had several episodes of similar chest discomfort in the past 3 days, but none of which lasted that long and usually went away with rest.  Patient also endorses an episode of palpitations yesterday as well as a 4-day history of mild dysuria for which she is being treated with Macrobid which has worked well for her in the past.  She denies any associated diaphoresis, shortness of breath, nausea or vomiting, flank pain, fevers, chills, or other symptoms.  She has only coughed a couple of times.  She is taking vitamin supplements rather than acid inhibitors for her reflux, but states that this feels much different from her previous GERD.  She also states that feels different than her anxiety.  She reports that she is feeling better since her initial diagnosis of COVID-19 and that she no longer endorses any loose stools, loss of smell or taste, or other symptoms.  She denies history of clots, recent leg swelling, immobilization or surgery, smoking, or hormone use.     HPI  Past Medical History:  Diagnosis Date  . ADHD (attention deficit hyperactivity disorder)   . Anemia    postpartum  . Anxiety   . COVID-19   . GERD (gastroesophageal reflux disease)   . History of chicken pox   . History of pyelonephritis    as child  . IBS (irritable bowel syndrome)   . Infection    UTI  . Interstitial  cystitis   . Migraines   . Seasonal allergies   . Tachycardia   . Tilted uterus     Patient Active Problem List   Diagnosis Date Noted  . Insect bite of right shoulder 06/19/2018  . Localized edema 05/19/2018  . Numbness and tingling of left leg 05/19/2018  . Insomnia 05/19/2018  . BMI 31.0-31.9,adult 01/22/2018  . Healthcare maintenance 12/24/2017  . GERD (gastroesophageal reflux disease) 12/24/2017  . Anemia 12/24/2017  . GAD (generalized anxiety disorder) 12/24/2017  . Migraine with aura and without status migrainosus, not intractable 02/01/2017  . S/P cesarean section 08/07/2015  . Placenta previa antepartum 08/03/2015  . TACHYCARDIA 06/07/2008  . DYSPNEA 06/07/2008    Past Surgical History:  Procedure Laterality Date  . CESAREAN SECTION N/A 08/07/2015   Procedure: CESAREAN SECTION;  Surgeon: Marcelle Overlie, MD;  Location: [facility] BIRTHING SUITES;  Service: Obstetrics;  Laterality: N/A;  . CESAREAN SECTION    . dilate and curettage  2016  . TUBAL LIGATION    . Tubiligation       OB History    Gravida  5   Para  3   Term  2   Preterm  1   AB  2   Living  3     SAB  2   TAB      Ectopic      Multiple  0   Live Births  3            Home Medications    Prior to Admission medications   Medication Sig Start Date End Date Taking? Authorizing Provider  escitalopram (LEXAPRO) 10 MG tablet Take 10 mg by mouth daily. 12/04/17   [provider]  frovatriptan (FROVA) 2.5 MG tablet Take 1 tablet (2.5 mg total) by mouth as needed for migraine. If recurs, may repeat after 2 hours. Max of 3 tabs in 24 hours. 01/01/19   Ward Givens, NP  ibuprofen (ADVIL,MOTRIN) 800 MG tablet Take 1 tablet (800 mg total) by mouth every 8 (eight) hours as needed. 05/20/18   Trula Slade, DPM  levonorgestrel (MIRENA, 52 MG,) 20 MCG/24HR IUD 1 each by Intrauterine route once.    [provider]  ondansetron (ZOFRAN) 8 MG tablet Take 1 tablet (8 mg total) by  mouth every 8 (eight) hours as needed for nausea or vomiting. 01/01/19   Danford, Valetta Fuller D, NP  pantoprazole (PROTONIX) 40 MG tablet TAKE 1 TABLET BY MOUTH DAILY. 09/22/18   Mansouraty, Telford Nab., MD  zolpidem (AMBIEN) 10 MG tablet Take 1 tablet (10 mg total) by mouth at bedtime as needed for sleep. 05/19/18   Esaw Grandchild, NP    Family History Family History  Problem Relation Age of Onset  . Hypertension Mother   . Hypothyroidism Mother   . Thyroid disease Mother   . Other Mother        benign brain tumor  . Depression Mother   . Hyperlipidemia Mother   . Cancer Maternal Grandfather        breast  . Heart attack Maternal Grandfather   . Hyperlipidemia Maternal Grandfather   . Hypertension Maternal Grandfather   . Breast cancer Maternal Grandfather   . Lupus Paternal Grandmother   . Diabetes Paternal Grandfather     Social History Social History   Tobacco Use  . Smoking status: Never Smoker  . Smokeless tobacco: Never Used  Substance Use Topics  . Alcohol use: Yes    Alcohol/week: 2.0 standard drinks    Types: 2 Glasses of wine per week    Comment: 1-2 per week  . Drug use: No     Allergies   Compazine [prochlorperazine edisylate]   Review of Systems Review of Systems  All other systems reviewed and are negative.    Physical Exam Updated Vital Signs Ht 5\' 7"  (1.702 m)   Wt 87.1 kg   BMI 30.07 kg/m   Physical Exam Vitals signs and nursing note reviewed. Exam conducted with a chaperone present.  Constitutional:      Appearance: Normal appearance.  HENT:     Head: Normocephalic and atraumatic.  Eyes:     General: No scleral icterus.    Conjunctiva/sclera: Conjunctivae normal.  Neck:     Musculoskeletal: Normal range of motion and neck supple. No neck rigidity.  Cardiovascular:     Rate and Rhythm: Normal rate and regular rhythm.     Pulses: Normal pulses.     Heart sounds: Normal heart sounds.  Pulmonary:     Effort: Pulmonary effort is normal.      Breath sounds: Normal breath sounds.  Abdominal:     Comments: Soft, nondistended.  Mild TTP over suprapubic region, but no TTP elsewhere.  No guarding.  No overlying skin changes.  Skin:    General: Skin is dry.  Neurological:     General: No focal deficit present.     Mental Status:  She is alert and oriented to person, place, and time.     GCS: GCS eye subscore is 4. GCS verbal subscore is 5. GCS motor subscore is 6.  Psychiatric:        Mood and Affect: Mood normal.        Behavior: Behavior normal.        Thought Content: Thought content normal.      ED Treatments / Results  Labs (all labs ordered are listed, but only abnormal results are displayed) Labs Reviewed  BASIC METABOLIC PANEL - Abnormal; Notable for the following components:      Result Value   Glucose, Bld 109 (*)    All other components within normal limits  CBC WITH DIFFERENTIAL/PLATELET  I-STAT BETA HCG BLOOD, ED ([facility], [facility], [facility] ONLY)  TROPONIN I (HIGH SENSITIVITY)  TROPONIN I (HIGH SENSITIVITY)    EKG EKG Interpretation  Date/Time:  Tuesday January 06 2019 17:13:55 EST Ventricular Rate:  95 PR Interval:    QRS Duration: 87 QT Interval:  337 QTC Calculation: 424 R Axis:   48 Text Interpretation: Sinus rhythm Low voltage, precordial leads Confirmed by Benjiman CorePickering, Nathan [phone]([id number]) on 01/06/2019 5:53:02 PM   Radiology Dg Chest Portable 1 View  Result Date: 01/06/2019 CLINICAL DATA:  Chest pain EXAM: PORTABLE CHEST 1 VIEW COMPARISON:  None. FINDINGS: Lungs are clear. Heart size and pulmonary vascularity are normal. No adenopathy. No pneumothorax. No bone lesions. IMPRESSION: No edema or consolidation. Electronically Signed   By: Bretta BangWilliam  Woodruff III M.D.   On: 01/06/2019 18:45    Procedures Procedures (including critical care time)  Medications Ordered in ED Medications - No data to display   Initial Impression / Assessment and Plan / ED Course  I have reviewed the triage vital signs and the  nursing notes.  Pertinent labs & imaging results that were available during my care of the patient were reviewed by me and considered in my medical decision making (see chart for details).       Patient's EKG demonstrated normal sinus rhythm and her initial troponin was less than 2.  Given chronicity since onset chest discomfort, do not need to trend troponin.  DG chest demonstrates no pneumothorax, consolidation concerning for pneumonia, or other acute cardiopulmonary processes.   While patient is COVID-19 positive, she is otherwise PERC negative.  She denies any pleuritic chest discomfort and her vitals are reassuring.  Do not feel as though CT angiogram for evaluation of pulmonary embolism is warranted at this time.  Strict return precautions provided to return to the ED for sustained tachycardia, hypoxia, or increased work of breathing.  She has a pulse oximeter at home and continuously tracked her oxygenation during illness.  Patient was able to ambulate on pulse ox prior to discharge and maintained saturation above 95% with no increased work of breathing.  She is safe for discharge.  Strict return precautions discussed with the patient. All of the evaluation and work-up results were discussed with the patient and any family at bedside. They were provided opportunity to ask any additional questions and have none at this time. They have expressed understanding of verbal discharge instructions as well as return precautions and are agreeable to the plan.   Tanya [name] was evaluated in Emergency Department on 01/06/2019 for the symptoms described in the history of present illness. She was evaluated in the context of the global COVID-19 pandemic, which necessitated consideration that the patient might be at risk for infection with the SARS-CoV-2  virus that causes COVID-19. Institutional protocols and algorithms that pertain to the evaluation of patients at risk for COVID-19 are in a state of rapid  change based on information released by regulatory bodies including the CDC and federal and state organizations. These policies and algorithms were followed during the patient's care in the ED.   Final Clinical Impressions(s) / ED Diagnoses   Final diagnoses:  Chest pain, unspecified type    ED Discharge Orders    None       Lorelee New, PA-C 01/06/19 2045    Lorelee New, PA-C 01/06/19 2046    Benjiman Core, MD 01/06/19 2358

## 2019-01-06 NOTE — Telephone Encounter (Signed)
Pt informed.  Pt expressed understanding and is agreeable.  T. Nelson, CMA  

## 2019-01-06 NOTE — Telephone Encounter (Signed)
Pt states that she is experiencing chest tightness that, at times, feels like someone is sitting on her chest and she is having some SOB.  She denies fever,arm pain, neck pain, shoulder pain, nausea or vomiting, and diapharesis.  Please advise.  Charyl Bigger, CMA

## 2019-01-06 NOTE — ED Notes (Signed)
Pulse ox while ambulating remains at 97-98%

## 2019-01-09 ENCOUNTER — Other Ambulatory Visit: Payer: Self-pay

## 2019-01-09 ENCOUNTER — Emergency Department (INDEPENDENT_AMBULATORY_CARE_PROVIDER_SITE_OTHER)
Admission: EM | Admit: 2019-01-09 | Discharge: 2019-01-09 | Disposition: A | Discharge status: Home / Self Care | Payer: BC Managed Care – PPO | Source: Home / Self Care | Attending: Emergency Medicine | Admitting: Emergency Medicine

## 2019-01-09 DIAGNOSIS — U071 COVID-19: Secondary | ICD-10-CM

## 2019-01-09 DIAGNOSIS — N3090 Cystitis, unspecified without hematuria: Secondary | ICD-10-CM

## 2019-01-09 DIAGNOSIS — R300 Dysuria: Secondary | ICD-10-CM

## 2019-01-09 LAB — POCT URINE PREGNANCY: Preg Test, Ur: NEGATIVE

## 2019-01-09 LAB — POCT URINALYSIS DIP (MANUAL ENTRY)
Bilirubin, UA: NEGATIVE
Glucose, UA: NEGATIVE mg/dL
Ketones, POC UA: NEGATIVE mg/dL
Nitrite, UA: POSITIVE — AB
Protein Ur, POC: NEGATIVE mg/dL
Spec Grav, UA: 1.02 (ref 1.010–1.025)
Urobilinogen, UA: 0.2 E.U./dL
pH, UA: 7 (ref 5.0–8.0)

## 2019-01-09 MED ORDER — AMOXICILLIN-POT CLAVULANATE 875-125 MG PO TABS: 1.0000 | ORAL_TABLET | Freq: Two times a day (BID) | ORAL | 0 refills | Status: DC

## 2019-01-09 NOTE — Discharge Instructions (Addendum)
We will call you with culture results. Take antibiotics twice a day with food. Drink good quantities of water.

## 2019-01-09 NOTE — ED Triage Notes (Signed)
Pt was Dx with covid a little over 2 weeks ago.  Started with UTI infection and given macrobid.  Finished medication yesterday, but still having dysuria and spasms.

## 2019-01-09 NOTE — ED Provider Notes (Addendum)
[facility]    CSN: [id number] Arrival date & time: 01/09/19  1127      History   Chief Complaint Chief Complaint  Patient presents with  . Abdominal Pain  . Dysuria    HPI Tanya [name] is a 38 y.o. female.  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  She recently was placed on a course of Macrobid which she has completed but did not seem to help her urinary symptoms.  She presents today with burning and stinging on urination.  She has urinary frequency.  She has an IUD in place.  She has not had any recent fever or chills.  Of note she is currently recovering from Covid.  She states her Covid symptoms have been continually improving.  She was seen in the emergency room recently with chest discomfort but this has resolved and she no longer has chest discomfort.  Work-up in the emergency room was completed. HPI  Past Medical History:  Diagnosis Date  . ADHD (attention deficit hyperactivity disorder)   . Anemia    postpartum  . Anxiety   . COVID-19   . GERD (gastroesophageal reflux disease)   . History of chicken pox   . History of pyelonephritis    as child  . IBS (irritable bowel syndrome)   . Infection    UTI  . Interstitial cystitis   . Migraines   . Seasonal allergies   . Tachycardia   . Tilted uterus     Patient Active Problem List   Diagnosis Date Noted  . Insect bite of right shoulder 06/19/2018  . Localized edema 05/19/2018  . Numbness and tingling of left leg 05/19/2018  . Insomnia 05/19/2018  . BMI 31.0-31.9,adult 01/22/2018  . Healthcare maintenance 12/24/2017  . GERD (gastroesophageal reflux disease) 12/24/2017  . Anemia 12/24/2017  . GAD (generalized anxiety disorder) 12/24/2017  . Migraine with aura and without status migrainosus, not intractable 02/01/2017  . S/P cesarean section 08/07/2015  . Placenta previa antepartum 08/03/2015  . TACHYCARDIA 06/07/2008  . DYSPNEA 06/07/2008     Past Surgical History:  Procedure Laterality Date  . CESAREAN SECTION N/A 08/07/2015   Procedure: CESAREAN SECTION;  Surgeon: Marcelle OverlieMichelle Grewal, MD;  [facility];  Service: Obstetrics;  Laterality: N/A;  . CESAREAN SECTION    . dilate and curettage  2016  . TUBAL LIGATION    . Tubiligation      OB History    Gravida  5   Para  3   Term  2   Preterm  1   AB  2   Living  3     SAB  2   TAB      Ectopic      Multiple  0   Live Births  3            Home Medications    Prior to Admission medications   Medication Sig Start Date End Date Taking? Authorizing Provider  amoxicillin-clavulanate (AUGMENTIN) 875-125 MG tablet Take 1 tablet by mouth every 12 (twelve) hours. 01/09/19   Collene Gobbleaub, Steven A, MD  escitalopram (LEXAPRO) 10 MG tablet Take 10 mg by mouth daily. 12/04/17   [provider]  frovatriptan (FROVA) 2.5 MG tablet Take 1 tablet (2.5 mg total) by mouth as needed for migraine. If recurs, may repeat after 2 hours. Max of 3 tabs in 24 hours. 01/01/19   Butch PennyMillikan, Megan, NP  ibuprofen (ADVIL,MOTRIN) 800 MG  tablet Take 1 tablet (800 mg total) by mouth every 8 (eight) hours as needed. 05/20/18   Vivi Barrack, DPM  levonorgestrel (MIRENA, 52 MG,) 20 MCG/24HR IUD 1 each by Intrauterine route once.    [provider]  ondansetron (ZOFRAN) 8 MG tablet Take 1 tablet (8 mg total) by mouth every 8 (eight) hours as needed for nausea or vomiting. 01/01/19   Danford, Orpha Bur D, NP  pantoprazole (PROTONIX) 40 MG tablet TAKE 1 TABLET BY MOUTH DAILY. 09/22/18   Mansouraty, Netty Starring., MD  zolpidem (AMBIEN) 10 MG tablet Take 1 tablet (10 mg total) by mouth at bedtime as needed for sleep. 05/19/18   Julaine Fusi, NP    Family History Family History  Problem Relation Age of Onset  . Hypertension Mother   . Hypothyroidism Mother   . Thyroid disease Mother   . Other Mother        benign brain tumor  . Depression Mother   . Hyperlipidemia Mother    . Cancer Maternal Grandfather        breast  . Heart attack Maternal Grandfather   . Hyperlipidemia Maternal Grandfather   . Hypertension Maternal Grandfather   . Breast cancer Maternal Grandfather   . Lupus Paternal Grandmother   . Diabetes Paternal Grandfather     Social History Social History   Tobacco Use  . Smoking status: Never Smoker  . Smokeless tobacco: Never Used  Substance Use Topics  . Alcohol use: Yes    Alcohol/week: 2.0 standard drinks    Types: 2 Glasses of wine per week    Comment: 1-2 per week  . Drug use: No     Allergies   Compazine [prochlorperazine edisylate]   Review of Systems Review of Systems  Constitutional: Negative.   Respiratory:       Chest discomfort and shortness of breath are improving  Gastrointestinal: Negative.   Genitourinary: Positive for dysuria and urgency. Negative for flank pain.       She feels she may be getting a low-grade yeast infection  Musculoskeletal: Negative.      Physical Exam Triage Vital Signs ED Triage Vitals  Enc Vitals Group     BP 01/09/19 1245 121/89     Pulse Rate 01/09/19 1245 81     Resp 01/09/19 1245 20     Temp 01/09/19 1245 98.4 F (36.9 C)     Temp Source 01/09/19 1245 Oral     SpO2 01/09/19 1245 (!) 78 %     Weight 01/09/19 1246 192 lb (87.1 kg)     Height 01/09/19 1246 5\' 7"  (1.702 m)     Head Circumference --      Peak Flow --      Pain Score 01/09/19 1246 6     Pain Loc --      Pain Edu? --      Excl. in GC? --    No data found.  Updated Vital Signs BP 121/89 (BP Location: Right Arm)   Pulse 79   Temp 98.4 F (36.9 C) (Oral)   Resp 20   Ht 5\' 7"  (1.702 m)   Wt 87.1 kg   SpO2 97%   BMI 30.07 kg/m   Visual Acuity Right Eye Distance:   Left Eye Distance:   Bilateral Distance:    Right Eye Near:   Left Eye Near:    Bilateral Near:     Physical Exam Constitutional:      Appearance: She is  well-developed.  Cardiovascular:     Rate and Rhythm: Normal rate and  regular rhythm.  Pulmonary:     Effort: Pulmonary effort is normal.     Breath sounds: Normal breath sounds.  Abdominal:     Palpations: Abdomen is soft.     Tenderness: There is abdominal tenderness in the suprapubic area. There is no right CVA tenderness or left CVA tenderness.  Neurological:     Mental Status: She is alert.      UC Treatments / Results  Labs (all labs ordered are listed, but only abnormal results are displayed) Labs Reviewed  POCT URINALYSIS DIP (MANUAL ENTRY) - Abnormal; Notable for the following components:      Result Value   Blood, UA trace-intact (*)    Nitrite, UA Positive (*)    Leukocytes, UA Moderate (2+) (*)    All other components within normal limits  URINE CULTURE  POCT URINE PREGNANCY    EKG   Radiology No results found.  Procedures Procedures (including critical care time)  Medications Ordered in UC Medications - No data to display  Initial Impression / Assessment and Plan / UC Course  I have reviewed the triage vital signs and the nursing notes. Patient has recently completed a course of Macrobid.  Her last culture was Enterococcus faecalis.  It was sensitive to Cipro, Macrobid, and amoxicillin.  Will change patient to Augmentin 875 twice daily for 7 days.  Her last culture was sensitive to amoxicillin.  Culture will be done.  Repeat urine culture was done.  She was diagnosed with Covid this month and has been improving regularly since her diagnosis.  She did have an ER visit for chest discomfort but work-up then was negative for an acute problem.  Initial pulse ox was entered as 78 but was actually 97 and the initial pulse ox was entered incorrectly. Pertinent labs & imaging results that were available during my care of the patient were reviewed by me and considered in my medical decision making (see chart for details).     Final Clinical Impressions(s) / UC Diagnoses   Final diagnoses:  Dysuria  Cystitis   Discharge  Instructions   None    ED Prescriptions    Medication Sig Dispense Auth. Provider   amoxicillin-clavulanate (AUGMENTIN) 875-125 MG tablet Take 1 tablet by mouth every 12 (twelve) hours. 14 tablet Darlyne Russian, MD     PDMP not reviewed this encounter.   Darlyne Russian, MD 01/09/19 1457    Darlyne Russian, MD 01/09/19 [id number]

## 2019-01-11 ENCOUNTER — Telehealth: Payer: Self-pay | Admitting: Emergency Medicine

## 2019-01-11 LAB — URINE CULTURE
MICRO NUMBER:: 1143375
SPECIMEN QUALITY:: ADEQUATE

## 2019-01-11 NOTE — Telephone Encounter (Signed)
Called. Feeling better. Will F/U with OB.Cx sensitive to augmentin.

## 2019-01-14 ENCOUNTER — Other Ambulatory Visit: Payer: Self-pay

## 2019-01-14 DIAGNOSIS — N920 Excessive and frequent menstruation with regular cycle: Secondary | ICD-10-CM | POA: Diagnosis not present

## 2019-01-14 DIAGNOSIS — N939 Abnormal uterine and vaginal bleeding, unspecified: Secondary | ICD-10-CM | POA: Diagnosis not present

## 2019-01-14 DIAGNOSIS — Z20822 Contact with and (suspected) exposure to covid-19: Secondary | ICD-10-CM

## 2019-01-14 DIAGNOSIS — R102 Pelvic and perineal pain: Secondary | ICD-10-CM | POA: Diagnosis not present

## 2019-01-15 DIAGNOSIS — Z20828 Contact with and (suspected) exposure to other viral communicable diseases: Secondary | ICD-10-CM | POA: Diagnosis not present

## 2019-01-18 LAB — NOVEL CORONAVIRUS, NAA: SARS-CoV-2, NAA: NOT DETECTED

## 2019-01-21 DIAGNOSIS — N840 Polyp of corpus uteri: Secondary | ICD-10-CM | POA: Diagnosis not present

## 2019-01-21 DIAGNOSIS — Z30432 Encounter for removal of intrauterine contraceptive device: Secondary | ICD-10-CM | POA: Diagnosis not present

## 2019-01-21 DIAGNOSIS — N920 Excessive and frequent menstruation with regular cycle: Secondary | ICD-10-CM | POA: Diagnosis not present

## 2019-02-10 DIAGNOSIS — Z13228 Encounter for screening for other metabolic disorders: Secondary | ICD-10-CM | POA: Diagnosis not present

## 2019-02-10 DIAGNOSIS — Z131 Encounter for screening for diabetes mellitus: Secondary | ICD-10-CM | POA: Diagnosis not present

## 2019-02-10 DIAGNOSIS — Z1329 Encounter for screening for other suspected endocrine disorder: Secondary | ICD-10-CM | POA: Diagnosis not present

## 2019-02-10 DIAGNOSIS — E785 Hyperlipidemia, unspecified: Secondary | ICD-10-CM | POA: Diagnosis not present

## 2019-02-10 DIAGNOSIS — Z1321 Encounter for screening for nutritional disorder: Secondary | ICD-10-CM | POA: Diagnosis not present

## 2019-02-10 DIAGNOSIS — Z13220 Encounter for screening for lipoid disorders: Secondary | ICD-10-CM | POA: Diagnosis not present

## 2019-02-16 DIAGNOSIS — M9902 Segmental and somatic dysfunction of thoracic region: Secondary | ICD-10-CM | POA: Diagnosis not present

## 2019-02-16 DIAGNOSIS — M9905 Segmental and somatic dysfunction of pelvic region: Secondary | ICD-10-CM | POA: Diagnosis not present

## 2019-02-16 DIAGNOSIS — M9906 Segmental and somatic dysfunction of lower extremity: Secondary | ICD-10-CM | POA: Diagnosis not present

## 2019-02-16 DIAGNOSIS — M9903 Segmental and somatic dysfunction of lumbar region: Secondary | ICD-10-CM | POA: Diagnosis not present

## 2019-02-18 DIAGNOSIS — Z01419 Encounter for gynecological examination (general) (routine) without abnormal findings: Secondary | ICD-10-CM | POA: Diagnosis not present

## 2019-02-18 DIAGNOSIS — Z1231 Encounter for screening mammogram for malignant neoplasm of breast: Secondary | ICD-10-CM | POA: Diagnosis not present

## 2019-02-18 DIAGNOSIS — Z6830 Body mass index (BMI) 30.0-30.9, adult: Secondary | ICD-10-CM | POA: Diagnosis not present

## 2019-03-16 DIAGNOSIS — L110 Acquired keratosis follicularis: Secondary | ICD-10-CM | POA: Diagnosis not present

## 2019-03-16 DIAGNOSIS — D225 Melanocytic nevi of trunk: Secondary | ICD-10-CM | POA: Diagnosis not present

## 2019-03-16 DIAGNOSIS — D485 Neoplasm of uncertain behavior of skin: Secondary | ICD-10-CM | POA: Diagnosis not present

## 2019-03-19 DIAGNOSIS — M9906 Segmental and somatic dysfunction of lower extremity: Secondary | ICD-10-CM | POA: Diagnosis not present

## 2019-03-19 DIAGNOSIS — M9905 Segmental and somatic dysfunction of pelvic region: Secondary | ICD-10-CM | POA: Diagnosis not present

## 2019-03-19 DIAGNOSIS — M9902 Segmental and somatic dysfunction of thoracic region: Secondary | ICD-10-CM | POA: Diagnosis not present

## 2019-03-19 DIAGNOSIS — M9903 Segmental and somatic dysfunction of lumbar region: Secondary | ICD-10-CM | POA: Diagnosis not present

## 2019-03-24 ENCOUNTER — Ambulatory Visit: Payer: BC Managed Care – PPO | Admitting: Adult Health

## 2019-03-24 ENCOUNTER — Other Ambulatory Visit: Payer: Self-pay

## 2019-03-24 ENCOUNTER — Encounter: Payer: Self-pay | Admitting: Adult Health

## 2019-03-24 VITALS — BP 109/63 | HR 83 | Temp 97.5°F | Ht 66.75 in | Wt 192.8 lb

## 2019-03-24 DIAGNOSIS — G43109 Migraine with aura, not intractable, without status migrainosus: Secondary | ICD-10-CM | POA: Diagnosis not present

## 2019-03-24 NOTE — Progress Notes (Signed)
PATIENT: Tanya [name] DOB: [birth year]  REASON FOR VISIT: follow up HISTORY FROM: patient  HISTORY OF PRESENT ILLNESS: Today 03/24/19:  Tanya [name] is a 39 year old female with a history of migraine headaches.  She returns today for follow-up.  She reports that since she had Covid in November her headaches have decreased.  She states that November December she had to use 9 tablets of Frova each month.  She states that her headaches have improved slightly in January.  She is also noticed some brain fog after Covid but also feels that this is getting better.  She returns today for an evaluation.  HISTORY 09/18/18: Tanya [name] is a 39 year old female with a history of migraine headaches.  She returns today for follow-up.  She states that her headaches have been under relatively good control.  She states that she has not had a headache in the last 2 months.  She states that she has used Frova and found it beneficial.  She does note that her cycle is slightly irregular.  It can start anywhere between the 24 to 27-day mark.  She states that this makes it hard to take Frova to prevent the migraines.  But she does state that she has taken it on the first day and it prevented a headache the rest of the week.  She also states that she noticed trouble with her memory.  She has trouble remembering names of people that she should know.  She also has made mistakes with her finances.  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  She denies any trouble preparing meals.  She is able to complete all ADLs independently.  She states that she has been on Ambien for years.  She is unsure if this is affecting her memory.  REVIEW OF SYSTEMS: Out of a complete 14 system review of symptoms, the patient complains only of the following symptoms, and all other reviewed systems are negative.  See HPI  ALLERGIES: Allergies  Allergen Reactions  . Compazine [Prochlorperazine Edisylate] Other (See  Comments)    HOME MEDICATIONS: Outpatient Medications Prior to Visit  Medication Sig Dispense Refill  . amoxicillin-clavulanate (AUGMENTIN) 875-125 MG tablet Take 1 tablet by mouth every 12 (twelve) hours. 14 tablet 0  . escitalopram (LEXAPRO) 10 MG tablet Take 10 mg by mouth daily.  12  . frovatriptan (FROVA) 2.5 MG tablet Take 1 tablet (2.5 mg total) by mouth as needed for migraine. If recurs, may repeat after 2 hours. Max of 3 tabs in 24 hours. 10 tablet 1  . ibuprofen (ADVIL,MOTRIN) 800 MG tablet Take 1 tablet (800 mg total) by mouth every 8 (eight) hours as needed. 30 tablet 0  . levonorgestrel (MIRENA, 52 MG,) 20 MCG/24HR IUD 1 each by Intrauterine route once.    . ondansetron (ZOFRAN) 8 MG tablet Take 1 tablet (8 mg total) by mouth every 8 (eight) hours as needed for nausea or vomiting. 20 tablet 0  . pantoprazole (PROTONIX) 40 MG tablet TAKE 1 TABLET BY MOUTH DAILY. 30 tablet 2  . zolpidem (AMBIEN) 10 MG tablet Take 1 tablet (10 mg total) by mouth at bedtime as needed for sleep. 30 tablet 0   No facility-administered medications prior to visit.    PAST MEDICAL HISTORY: Past Medical History:  Diagnosis Date  . ADHD (attention deficit hyperactivity disorder)   . Anemia    postpartum  . Anxiety   . COVID-19   . GERD (gastroesophageal reflux  disease)   . History of chicken pox   . History of pyelonephritis    as child  . IBS (irritable bowel syndrome)   . Infection    UTI  . Interstitial cystitis   . Migraines   . Seasonal allergies   . Tachycardia   . Tilted uterus     PAST SURGICAL HISTORY: Past Surgical History:  Procedure Laterality Date  . CESAREAN SECTION N/A 08/07/2015   Procedure: CESAREAN SECTION;  Surgeon: Marcelle Overlie, MD;  Location: [facility] BIRTHING SUIT[facility];  Service: Obstetrics;  Laterality: N/A;  . CESAREAN SECTION    . dilate and curettage  2016  . TUBAL LIGATION    . Tubiligation      FAMILY HISTORY: Family History  Problem Relation Age of Onset    . Hypertension Mother   . Hypothyroidism Mother   . Thyroid disease Mother   . Other Mother        benign brain tumor  . Depression Mother   . Hyperlipidemia Mother   . Cancer Maternal Grandfather        breast  . Heart attack Maternal Grandfather   . Hyperlipidemia Maternal Grandfather   . Hypertension Maternal Grandfather   . Breast cancer Maternal Grandfather   . Lupus Paternal Grandmother   . Diabetes Paternal Grandfather     SOCIAL HISTORY: Social History   Socioeconomic History  . Marital status: Married    Spouse name: Not on file  . Number of children: 3  . Years of education: college  . Highest education level: Not on file  Occupational History    Comment: CPA  Tobacco Use  . Smoking status: Never Smoker  . Smokeless tobacco: Never Used  Substance and Sexual Activity  . Alcohol use: Yes    Alcohol/week: 2.0 standard drinks    Types: 2 Glasses of wine per week    Comment: 1-2 per week  . Drug use: No  . Sexual activity: Yes    Birth control/protection: Surgical  Other Topics Concern  . Not on file  Social History Narrative   Lives at home with spouse, Tanya [name], and 3 kids.  Works at [employer].  Education: college.  Caffeine 1-2 per day.    Social Determinants of Health   Financial Resource Strain:   . Difficulty of Paying Living Expenses: Not on file  Food Insecurity:   . Worried About Programme researcher, broadcasting/film/video in the Last Year: Not on file  . Ran Out of Food in the Last Year: Not on file  Transportation Needs:   . Lack of Transportation (Medical): Not on file  . Lack of Transportation (Non-Medical): Not on file  Physical Activity:   . Days of Exercise per Week: Not on file  . Minutes of Exercise per Session: Not on file  Stress:   . Feeling of Stress : Not on file  Social Connections:   . Frequency of Communication with Friends and Family: Not on file  . Frequency of Social Gatherings with Friends and Family: Not on file  . Attends  Religious Services: Not on file  . Active Member of Clubs or Organizations: Not on file  . Attends Banker Meetings: Not on file  . Marital Status: Not on file  Intimate Partner Violence:   . Fear of Current or Ex-Partner: Not on file  . Emotionally Abused: Not on file  . Physically Abused: Not on file  . Sexually Abused: Not on file  PHYSICAL EXAM  Vitals:   03/24/19 0855  BP: 109/63  Pulse: 83  Temp: (!) 97.5 F (36.4 C)  Weight: 192 lb 12.8 oz (87.5 kg)  Height: 5' 6.75" (1.695 m)   Body mass index is 30.42 kg/m.  Generalized: Well developed, in no acute distress   Neurological examination  Mentation: Alert oriented to time, place, history taking. Follows all commands speech and language fluent Cranial nerve II-XII: Pupils were equal round reactive to light. Extraocular movements were full, visual field were full on confrontational test. Head turning and shoulder shrug  were normal and symmetric. Motor: The motor testing reveals 5 over 5 strength of all 4 extremities. Good symmetric motor tone is noted throughout.  Sensory: Sensory testing is intact to soft touch on all 4 extremities. No evidence of extinction is noted.  Coordination: Cerebellar testing reveals good finger-nose-finger and heel-to-shin bilaterally.  Gait and station: Gait is normal. Tandem gait is normal. Romberg is negative. No drift is seen.  Reflexes: Deep tendon reflexes are symmetric and normal bilaterally.   DIAGNOSTIC DATA (LABS, IMAGING, TESTING) - I reviewed patient records, labs, notes, testing and imaging myself where available.  Lab Results  Component Value Date   WBC 8.2 01/06/2019   HGB 14.1 01/06/2019   HCT 42.7 01/06/2019   MCV 89.7 01/06/2019   PLT 234 01/06/2019      Component Value Date/Time   NA 139 01/06/2019 1835   NA 139 05/19/2018 1451   K 4.3 01/06/2019 1835   CL 104 01/06/2019 1835   CO2 27 01/06/2019 1835   GLUCOSE 109 (H) 01/06/2019 1835   BUN 17  01/06/2019 1835   BUN 14 05/19/2018 1451   CREATININE 0.93 01/06/2019 1835   CREATININE 0.78 04/23/2013 0916   CALCIUM 9.3 01/06/2019 1835   PROT 6.8 05/19/2018 1451   ALBUMIN 4.5 05/19/2018 1451   AST 19 05/19/2018 1451   ALT 16 05/19/2018 1451   ALKPHOS 59 05/19/2018 1451   BILITOT 0.3 05/19/2018 1451   GFRNONAA >60 01/06/2019 1835   GFRAA >60 01/06/2019 1835   Lab Results  Component Value Date   CHOL 181 01/16/2018   HDL 57 01/16/2018   LDLCALC 109 (H) 01/16/2018   TRIG 73 01/16/2018   CHOLHDL 3.2 01/16/2018   Lab Results  Component Value Date   HGBA1C 5.1 01/16/2018   Lab Results  Component Value Date   VITAMINB12 399 09/18/2018   Lab Results  Component Value Date   TSH 2.110 09/18/2018      ASSESSMENT AND PLAN 39 y.o. year old female  has a past medical history of ADHD (attention deficit hyperactivity disorder), Anemia, Anxiety, COVID-19, GERD (gastroesophageal reflux disease), History of chicken pox, History of pyelonephritis, IBS (irritable bowel syndrome), Infection, Interstitial cystitis, Migraines, Seasonal allergies, Tachycardia, and Tilted uterus. here with:  1.  Migraine headaches  -We discussed starting a preventative medication however the patient is going to wait 1 more month to see if her headaches continue to improve -Continue Frova as an abortive therapy -Advised if symptoms worsen or she develops new symptoms she should let us know. -Follow-up in 6 months or sooner if needed   I spent 15 minutes with the patient. 50% of this time was spent discussing plan of care   Ward Givens, MSN, NP-C 03/24/2019, 8:48 AM [facility] [address] [place], [place] [address] [phone]

## 2019-03-24 NOTE — Patient Instructions (Signed)
Your Plan:  Continue Frova If your symptoms worsen or you develop new symptoms please let us know.   Thank you for coming to see [place] at [facility]. I hope we have been able to provide you high quality care today.  You may receive a patient satisfaction survey over the next few weeks. We would appreciate your feedback and comments so that we may continue to improve ourselves and the health of our patients.

## 2019-03-27 DIAGNOSIS — M9905 Segmental and somatic dysfunction of pelvic region: Secondary | ICD-10-CM | POA: Diagnosis not present

## 2019-03-27 DIAGNOSIS — M9903 Segmental and somatic dysfunction of lumbar region: Secondary | ICD-10-CM | POA: Diagnosis not present

## 2019-03-27 DIAGNOSIS — M9906 Segmental and somatic dysfunction of lower extremity: Secondary | ICD-10-CM | POA: Diagnosis not present

## 2019-03-27 DIAGNOSIS — M9902 Segmental and somatic dysfunction of thoracic region: Secondary | ICD-10-CM | POA: Diagnosis not present

## 2019-04-14 DIAGNOSIS — M9906 Segmental and somatic dysfunction of lower extremity: Secondary | ICD-10-CM | POA: Diagnosis not present

## 2019-04-14 DIAGNOSIS — M9903 Segmental and somatic dysfunction of lumbar region: Secondary | ICD-10-CM | POA: Diagnosis not present

## 2019-04-14 DIAGNOSIS — M9905 Segmental and somatic dysfunction of pelvic region: Secondary | ICD-10-CM | POA: Diagnosis not present

## 2019-04-14 DIAGNOSIS — M9902 Segmental and somatic dysfunction of thoracic region: Secondary | ICD-10-CM | POA: Diagnosis not present

## 2019-04-28 DIAGNOSIS — M9902 Segmental and somatic dysfunction of thoracic region: Secondary | ICD-10-CM | POA: Diagnosis not present

## 2019-04-28 DIAGNOSIS — M9906 Segmental and somatic dysfunction of lower extremity: Secondary | ICD-10-CM | POA: Diagnosis not present

## 2019-04-28 DIAGNOSIS — M9903 Segmental and somatic dysfunction of lumbar region: Secondary | ICD-10-CM | POA: Diagnosis not present

## 2019-04-28 DIAGNOSIS — M9905 Segmental and somatic dysfunction of pelvic region: Secondary | ICD-10-CM | POA: Diagnosis not present

## 2019-05-05 ENCOUNTER — Ambulatory Visit
Admission: EM | Admit: 2019-05-05 | Discharge: 2019-05-05 | Disposition: A | Discharge status: Home / Self Care | Payer: BC Managed Care – PPO | Attending: Emergency Medicine | Admitting: Emergency Medicine

## 2019-05-05 ENCOUNTER — Encounter: Payer: Self-pay | Admitting: Emergency Medicine

## 2019-05-05 ENCOUNTER — Other Ambulatory Visit: Payer: Self-pay

## 2019-05-05 DIAGNOSIS — L608 Other nail disorders: Secondary | ICD-10-CM | POA: Diagnosis not present

## 2019-05-05 MED ORDER — CIPROFLOXACIN HCL 0.3 % OP SOLN: 1.0000 [drp] | Freq: Two times a day (BID) | OPHTHALMIC | 0 refills | Status: AC

## 2019-05-05 NOTE — ED Provider Notes (Signed)
[name]    CSN: [id number] Arrival date & time: 05/05/19  1554      History   Chief Complaint Chief Complaint  Patient presents with  . Nailbed Changes    HPI Tanya [name] is a 39 y.o. female presenting for green discoloration to left ring finger nailbed.  States she had nails done with powder SNS material.  Had them removed the other day and noticed greenish discoloration.  Patient does endorse history of fungal nail infections: Denies having 1 at time of paddle placement.  Patient denying pain, but does endorse TTP.  No discharge, fever, arthralgias, myalgias, other areas of concern.  Patient denies trauma to the area.    Past Medical History:  Diagnosis Date  . ADHD (attention deficit hyperactivity disorder)   . Anemia    postpartum  . Anxiety   . COVID-19   . GERD (gastroesophageal reflux disease)   . History of chicken pox   . History of pyelonephritis    as child  . IBS (irritable bowel syndrome)   . Infection    UTI  . Interstitial cystitis   . Migraines   . Seasonal allergies   . Tachycardia   . Tilted uterus     Patient Active Problem List   Diagnosis Date Noted  . Insect bite of right shoulder 06/19/2018  . Localized edema 05/19/2018  . Numbness and tingling of left leg 05/19/2018  . Insomnia 05/19/2018  . BMI 31.0-31.9,adult 01/22/2018  . Healthcare maintenance 12/24/2017  . GERD (gastroesophageal reflux disease) 12/24/2017  . Anemia 12/24/2017  . GAD (generalized anxiety disorder) 12/24/2017  . Migraine with aura and without status migrainosus, not intractable 02/01/2017  . S/P cesarean section 08/07/2015  . Placenta previa antepartum 08/03/2015  . TACHYCARDIA 06/07/2008  . DYSPNEA 06/07/2008    Past Surgical History:  Procedure Laterality Date  . CESAREAN SECTION N/A 08/07/2015   Procedure: CESAREAN SECTION;  Surgeon: Marcelle Overlie, MD;  Location: [facility]THING SUIT[facility];  Service: Obstetrics;  Laterality: N/A;  . CESAREAN  SECTION    . dilate and curettage  2016  . TUBAL LIGATION    . Tubiligation      OB History    Gravida  5   Para  3   Term  2   Preterm  1   AB  2   Living  3     SAB  2   TAB      Ectopic      Multiple  0   Live Births  3            Home Medications    Prior to Admission medications   Medication Sig Start Date End Date Taking? Authorizing Provider  ALPRAZolam (XANAX) 0.25 MG tablet Take 0.25 mg by mouth 3 (three) times daily as needed. 01/01/19   [provider]  ciprofloxacin (CILOXAN) 0.3 % ophthalmic solution Place 1 drop into both eyes in the morning and at bedtime for 7 days. For nailbed 05/05/19 05/12/19  Hall-Potvin, [place], PA-C  escitalopram (LEXAPRO) 10 MG tablet Take 10 mg by mouth daily. 12/04/17   [provider]  frovatriptan (FROVA) 2.5 MG tablet Take 1 tablet (2.5 mg total) by mouth as needed for migraine. If recurs, may repeat after 2 hours. Max of 3 tabs in 24 hours. 01/01/19   Butch Penny, NP  ibuprofen (ADVIL,MOTRIN) 800 MG tablet Take 1 tablet (800 mg total) by mouth every 8 (eight) hours as needed. 05/20/18  Trula Slade, DPM  ondansetron (ZOFRAN) 8 MG tablet Take 1 tablet (8 mg total) by mouth every 8 (eight) hours as needed for nausea or vomiting. 01/01/19   Esaw Grandchild, NP    Family History Family History  Problem Relation Age of Onset  . Hypertension Mother   . Hypothyroidism Mother   . Thyroid disease Mother   . Other Mother        benign brain tumor  . Depression Mother   . Hyperlipidemia Mother   . Cancer Maternal Grandfather        breast  . Heart attack Maternal Grandfather   . Hyperlipidemia Maternal Grandfather   . Hypertension Maternal Grandfather   . Breast cancer Maternal Grandfather   . Lupus Paternal Grandmother   . Diabetes Paternal Grandfather     Social History Social History   Tobacco Use  . Smoking status: Never Smoker  . Smokeless tobacco: Never Used  Substance Use  Topics  . Alcohol use: Yes    Alcohol/week: 2.0 standard drinks    Types: 2 Glasses of wine per week    Comment: 1-2 per week  . Drug use: No     Allergies   Compazine [prochlorperazine edisylate]   Review of Systems As per HPI   Physical Exam Triage Vital Signs ED Triage Vitals [05/05/19 1607]  Enc Vitals Group     BP 102/61     Pulse Rate 78     Resp 16     Temp 97.8 F (36.6 C)     Temp Source Temporal     SpO2 98 %     Weight      Height      Head Circumference      Peak Flow      Pain Score 0     Pain Loc      Pain Edu?      Excl. in [place]?    No data found.  Updated Vital Signs BP 102/61 (BP Location: Left Arm)   Pulse 78   Temp 97.8 F (36.6 C) (Temporal)   Resp 16   SpO2 98%   Visual Acuity Right Eye Distance:   Left Eye Distance:   Bilateral Distance:    Right Eye Near:   Left Eye Near:    Bilateral Near:     Physical Exam Constitutional:      General: She is not in acute distress. HENT:     Head: Normocephalic and atraumatic.  Eyes:     General: No scleral icterus.    Pupils: Pupils are equal, round, and reactive to light.  Cardiovascular:     Rate and Rhythm: Normal rate.  Pulmonary:     Effort: Pulmonary effort is normal.  Musculoskeletal:        General: No swelling or tenderness. Normal range of motion.  Skin:    Coloration: Skin is not jaundiced or pale.     Findings: Lesion present.  Neurological:     Mental Status: She is alert and oriented to person, place, and time.         UC Treatments / Results  Labs (all labs ordered are listed, but only abnormal results are displayed) Labs Reviewed - No data to display  EKG   Radiology No results found.  Procedures Procedures (including critical care time)  Medications Ordered in UC Medications - No data to display  Initial Impression / Assessment and Plan / UC Course  I have reviewed the triage  vital signs and the nursing notes.  Pertinent labs & imaging results  that were available during my care of the patient were reviewed by me and considered in my medical decision making (see chart for details).     Patient afebrile, nontoxic in office today.  No systemic symptoms.  Patient states she is done research online and is concerned for "green nail syndrome ".  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  Per up-to-date we will trial topical flora quinolone and monitor for improvement over the next 2 weeks.  Patient also to schedule follow-up with both her primary care and dermatology for persistent/worsening symptoms.  Return precautions discussed, patient verbalized understanding and is agreeable to plan. Final Clinical Impressions(s) / UC Diagnoses   Final diagnoses:  Green nails     Discharge Instructions     Avoid excessive immersion in hot water, even when wearing protective gloves. After washing, the nails should be dried thoroughly. In some cases, the use of a hair dryer is recommended to keep the nail plate-nail bed space as dry as possible. Thorough trimming should be repeated at intervals of four weeks until the normal nail regrows.     ED Prescriptions    Medication Sig Dispense Auth. Provider   ciprofloxacin (CILOXAN) 0.3 % ophthalmic solution Place 1 drop into both eyes in the morning and at bedtime for 7 days. For nailbed 10 mL Hall-Potvin, [place], PA-C     PDMP not reviewed this encounter.   Hall-Potvin, [place], [place] 05/07/19 1739

## 2019-05-05 NOTE — ED Triage Notes (Signed)
Pt presents to [facility] for assessment after getting her nailpolish removed after having S&S coverage  and noted a green area to ring finger to left hand.  Denies any pain to the area.

## 2019-05-05 NOTE — Discharge Instructions (Signed)
Avoid excessive immersion in hot water, even when wearing protective gloves. After washing, the nails should be dried thoroughly. In some cases, the use of a hair dryer is recommended to keep the nail plate-nail bed space as dry as possible. Thorough trimming should be repeated at intervals of four weeks until the normal nail regrows.

## 2019-05-21 ENCOUNTER — Telehealth: Payer: Self-pay | Admitting: Emergency Medicine

## 2019-05-21 NOTE — Telephone Encounter (Signed)
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  Patient reporting near compliance with ciprofloxacin drops.  Noting improvement in nail color.  Denies growth of lesion.  Nailbed is growing out well.  No systemic symptoms.  Patient scheduling follow-up with dermatology.  Advised patient continue drops in the interim/x 2 weeks.  Patient verbalized understanding, no further questions at this time.

## 2019-05-26 ENCOUNTER — Encounter: Payer: Self-pay | Admitting: Adult Health

## 2019-05-26 ENCOUNTER — Other Ambulatory Visit: Payer: Self-pay | Admitting: Adult Health

## 2019-07-29 DIAGNOSIS — N390 Urinary tract infection, site not specified: Secondary | ICD-10-CM | POA: Diagnosis not present

## 2019-07-29 DIAGNOSIS — N3289 Other specified disorders of bladder: Secondary | ICD-10-CM | POA: Diagnosis not present

## 2019-07-29 DIAGNOSIS — R309 Painful micturition, unspecified: Secondary | ICD-10-CM | POA: Diagnosis not present

## 2019-07-31 DIAGNOSIS — Z79899 Other long term (current) drug therapy: Secondary | ICD-10-CM | POA: Diagnosis not present

## 2019-07-31 DIAGNOSIS — R41840 Attention and concentration deficit: Secondary | ICD-10-CM | POA: Diagnosis not present

## 2019-07-31 DIAGNOSIS — F902 Attention-deficit hyperactivity disorder, combined type: Secondary | ICD-10-CM | POA: Diagnosis not present

## 2019-07-31 DIAGNOSIS — F419 Anxiety disorder, unspecified: Secondary | ICD-10-CM | POA: Diagnosis not present

## 2019-09-21 ENCOUNTER — Ambulatory Visit: Payer: BC Managed Care – PPO | Admitting: Adult Health

## 2019-09-23 DIAGNOSIS — S9032XA Contusion of left foot, initial encounter: Secondary | ICD-10-CM | POA: Diagnosis not present

## 2019-09-23 DIAGNOSIS — M79672 Pain in left foot: Secondary | ICD-10-CM | POA: Diagnosis not present

## 2019-09-27 DIAGNOSIS — S93602A Unspecified sprain of left foot, initial encounter: Secondary | ICD-10-CM | POA: Diagnosis not present

## 2019-09-30 ENCOUNTER — Other Ambulatory Visit: Payer: Self-pay

## 2019-09-30 ENCOUNTER — Encounter: Payer: Self-pay | Admitting: Emergency Medicine

## 2019-09-30 ENCOUNTER — Ambulatory Visit
Admission: EM | Admit: 2019-09-30 | Discharge: 2019-09-30 | Disposition: A | Discharge status: Home / Self Care | Payer: BC Managed Care – PPO | Attending: Emergency Medicine | Admitting: Emergency Medicine

## 2019-09-30 DIAGNOSIS — N3001 Acute cystitis with hematuria: Secondary | ICD-10-CM | POA: Diagnosis not present

## 2019-09-30 LAB — POCT URINALYSIS DIP (MANUAL ENTRY)
Bilirubin, UA: NEGATIVE
Glucose, UA: NEGATIVE mg/dL
Ketones, POC UA: NEGATIVE mg/dL
Nitrite, UA: POSITIVE — AB
Protein Ur, POC: NEGATIVE mg/dL
Spec Grav, UA: 1.025 (ref 1.010–1.025)
Urobilinogen, UA: 0.2 E.U./dL
pH, UA: 7 (ref 5.0–8.0)

## 2019-09-30 MED ORDER — FLUCONAZOLE 150 MG PO TABS: 150.0000 mg | ORAL_TABLET | Freq: Every day | ORAL | 0 refills | Status: DC

## 2019-09-30 MED ORDER — NITROFURANTOIN MONOHYD MACRO 100 MG PO CAPS: 100.0000 mg | ORAL_CAPSULE | Freq: Two times a day (BID) | ORAL | 0 refills | Status: AC

## 2019-09-30 NOTE — ED Triage Notes (Signed)
Pt here for dysuria x 4 days with hx of similar in past

## 2019-09-30 NOTE — Discharge Instructions (Addendum)
Take antibiotic twice daily with food. Important to drink plenty of water throughout the day. Return for worsening urinary symptoms, blood in urine, abdominal or back pain, fever. 

## 2019-09-30 NOTE — ED Provider Notes (Signed)
[name]    CSN: [id number] Arrival date & time: 09/30/19  1626      History   Chief Complaint Chief Complaint  Patient presents with  . Dysuria    HPI Tanya [name] is a 39 y.o. female presenting for concern of UTI.  Endorsing dysuria with cloudy urine x4 days.  Has history of frequent UTI, renal reflux.  No vaginal discharge, abdominal pain, back pain, fever.   Past Medical History:  Diagnosis Date  . ADHD (attention deficit hyperactivity disorder)   . Anemia    postpartum  . Anxiety   . COVID-19   . GERD (gastroesophageal reflux disease)   . History of chicken pox   . History of pyelonephritis    as child  . IBS (irritable bowel syndrome)   . Infection    UTI  . Interstitial cystitis   . Migraines   . Seasonal allergies   . Tachycardia   . Tilted uterus     Patient Active Problem List   Diagnosis Date Noted  . Insect bite of right shoulder 06/19/2018  . Localized edema 05/19/2018  . Numbness and tingling of left leg 05/19/2018  . Insomnia 05/19/2018  . BMI 31.0-31.9,adult 01/22/2018  . Healthcare maintenance 12/24/2017  . GERD (gastroesophageal reflux disease) 12/24/2017  . Anemia 12/24/2017  . GAD (generalized anxiety disorder) 12/24/2017  . Migraine with aura and without status migrainosus, not intractable 02/01/2017  . S/P cesarean section 08/07/2015  . Placenta previa antepartum 08/03/2015  . TACHYCARDIA 06/07/2008  . DYSPNEA 06/07/2008    Past Surgical History:  Procedure Laterality Date  . CESAREAN SECTION N/A 08/07/2015   Procedure: CESAREAN SECTION;  Surgeon: Marcelle Overlie, MD;  Location: [facility] BIRTHING SUITES;  Service: Obstetrics;  Laterality: N/A;  . CESAREAN SECTION    . dilate and curettage  2016  . TUBAL LIGATION    . Tubiligation      OB History    Gravida  5   Para  3   Term  2   Preterm  1   AB  2   Living  3     SAB  2   TAB      Ectopic      Multiple  0   Live Births  3             Home Medications    Prior to Admission medications   Medication Sig Start Date End Date Taking? Authorizing Provider  ALPRAZolam (XANAX) 0.25 MG tablet Take 0.25 mg by mouth 3 (three) times daily as needed. 01/01/19   [provider]  escitalopram (LEXAPRO) 10 MG tablet Take 10 mg by mouth daily. 12/04/17   [provider]  fluconazole (DIFLUCAN) 150 MG tablet Take 1 tablet (150 mg total) by mouth daily. May repeat in 72 hours if needed 09/30/19   Hall-Potvin, [place], PA-C  frovatriptan (FROVA) 2.5 MG tablet TAKE 1 TABLET BY MOUTH AS NEEDED FOR MIGRAINE. IF RECURS, MAY REPEAT AFTER 2 HOURS. MAX OF 3 TABS IN 24 HOURS. 05/26/19   Butch Penny, NP  ibuprofen (ADVIL,MOTRIN) 800 MG tablet Take 1 tablet (800 mg total) by mouth every 8 (eight) hours as needed. 05/20/18   Vivi Barrack, DPM  nitrofurantoin, macrocrystal-monohydrate, (MACROBID) 100 MG capsule Take 1 capsule (100 mg total) by mouth 2 (two) times daily for 7 days. 09/30/19 10/07/19  Hall-Potvin, [place], PA-C  ondansetron (ZOFRAN) 8 MG tablet Take 1 tablet (8 mg total) by mouth every 8 (  eight) hours as needed for nausea or vomiting. 01/01/19   Julaine Fusi, NP    Family History Family History  Problem Relation Age of Onset  . Hypertension Mother   . Hypothyroidism Mother   . Thyroid disease Mother   . Other Mother        benign brain tumor  . Depression Mother   . Hyperlipidemia Mother   . Cancer Maternal Grandfather        breast  . Heart attack Maternal Grandfather   . Hyperlipidemia Maternal Grandfather   . Hypertension Maternal Grandfather   . Breast cancer Maternal Grandfather   . Lupus Paternal Grandmother   . Diabetes Paternal Grandfather     Social History Social History   Tobacco Use  . Smoking status: Never Smoker  . Smokeless tobacco: Never Used  Vaping Use  . Vaping Use: Never used  Substance Use Topics  . Alcohol use: Yes    Alcohol/week: 2.0 standard drinks    Types: 2  Glasses of wine per week    Comment: 1-2 per week  . Drug use: No     Allergies   Compazine [prochlorperazine edisylate]   Review of Systems As per HPI   Physical Exam Triage Vital Signs ED Triage Vitals  Enc Vitals Group     BP      Pulse      Resp      Temp      Temp src      SpO2      Weight      Height      Head Circumference      Peak Flow      Pain Score      Pain Loc      Pain Edu?      Excl. in GC?    No data found.  Updated Vital Signs BP 124/81 (BP Location: Left Arm)   Pulse 95   Temp 99.1 F (37.3 C) (Oral)   Resp 18   SpO2 97%   Visual Acuity Right Eye Distance:   Left Eye Distance:   Bilateral Distance:    Right Eye Near:   Left Eye Near:    Bilateral Near:     Physical Exam Constitutional:      General: She is not in acute distress. HENT:     Head: Normocephalic and atraumatic.  Eyes:     General: No scleral icterus.    Pupils: Pupils are equal, round, and reactive to light.  Cardiovascular:     Rate and Rhythm: Normal rate.  Pulmonary:     Effort: Pulmonary effort is normal.  Abdominal:     General: Bowel sounds are normal.     Palpations: Abdomen is soft.     Tenderness: There is no abdominal tenderness. There is no right CVA tenderness, left CVA tenderness or guarding.  Skin:    Coloration: Skin is not jaundiced or pale.  Neurological:     Mental Status: She is alert and oriented to person, place, and time.      UC Treatments / Results  Labs (all labs ordered are listed, but only abnormal results are displayed) Labs Reviewed  POCT URINALYSIS DIP (MANUAL ENTRY) - Abnormal; Notable for the following components:      Result Value   Clarity, UA cloudy (*)    Blood, UA trace-intact (*)    Nitrite, UA Positive (*)    Leukocytes, UA Large (3+) (*)    All other  components within normal limits  URINE CULTURE    EKG   Radiology No results found.  Procedures Procedures (including critical care time)  Medications  Ordered in UC Medications - No data to display  Initial Impression / Assessment and Plan / UC Course  I have reviewed the triage vital signs and the nursing notes.  Pertinent labs & imaging results that were available during my care of the patient were reviewed by me and considered in my medical decision making (see chart for details).     Patient febrile, nontoxic in office today.  Urine with large leukocytes, nitrates, blood.  Will start Macrobid which patient states that she has tolerated well in the past.  Culture pending.  Follow-up with PCP.  Return precautions discussed, pt verbalized understanding and is agreeable to plan. Final Clinical Impressions(s) / UC Diagnoses   Final diagnoses:  Acute cystitis with hematuria     Discharge Instructions     Take antibiotic twice daily with food. Important to drink plenty of water throughout the day. Return for worsening urinary symptoms, blood in urine, abdominal or back pain, fever.    ED Prescriptions    Medication Sig Dispense Auth. Provider   nitrofurantoin, macrocrystal-monohydrate, (MACROBID) 100 MG capsule Take 1 capsule (100 mg total) by mouth 2 (two) times daily for 7 days. 14 capsule Hall-Potvin, [place], PA-C   fluconazole (DIFLUCAN) 150 MG tablet Take 1 tablet (150 mg total) by mouth daily. May repeat in 72 hours if needed 2 tablet Hall-Potvin, [place], PA-C     PDMP not reviewed this encounter.   Odette Fraction [place], [place] 09/30/19 1916

## 2019-10-03 LAB — URINE CULTURE: Culture: >=100000 — AB

## 2019-10-24 ENCOUNTER — Telehealth: Payer: BC Managed Care – PPO | Admitting: Orthopedic Surgery

## 2019-10-24 DIAGNOSIS — R399 Unspecified symptoms and signs involving the genitourinary system: Secondary | ICD-10-CM | POA: Diagnosis not present

## 2019-10-24 MED ORDER — NITROFURANTOIN MONOHYD MACRO 100 MG PO CAPS: 100.0000 mg | ORAL_CAPSULE | Freq: Two times a day (BID) | ORAL | 0 refills | Status: AC

## 2019-10-24 NOTE — Progress Notes (Signed)
We are sorry that you are not feeling well.  Here is how we plan to help!  Based on what you shared with me it looks like you most likely have a simple urinary tract infection.  A UTI (Urinary Tract Infection) is a bacterial infection of the bladder.  Most cases of urinary tract infections are simple to treat but a key part of your care is to encourage you to drink plenty of fluids and watch your symptoms carefully.  I have prescribed MacroBid 100 mg twice a day for 7 days.  Your symptoms should gradually improve. Call us if the burning in your urine worsens, you develop worsening fever, back pain or pelvic pain or if your symptoms do not resolve after completing the antibiotic.  Urinary tract infections can be prevented by drinking plenty of water to keep your body hydrated.  Also be sure when you wipe, wipe from front to back and don't hold it in!  If possible, empty your bladder every 4 hours.  Your e-visit answers were reviewed by a board certified advanced clinical practitioner to complete your personal care plan.  Depending on the condition, your plan could have included both over the counter or prescription medications.  If there is a problem please reply  once you have received a response from your provider.  Your safety is important to [place].  If you have drug allergies check your prescription carefully.    You can use MyChart to ask questions about todays visit, request a non-urgent call back, or ask for a work or school excuse for 24 hours related to this e-Visit. If it has been greater than 24 hours you will need to follow up with your provider, or enter a new e-Visit to address those concerns.   You will get an e-mail in the next two days asking about your experience.  I hope that your e-visit has been valuable and will speed your recovery. Thank you for using e-visits.   Greater than 5 minutes, yet less than 10 minutes of time have been spent researching, coordinating and  implementing care for this patient today.

## 2019-10-28 DIAGNOSIS — F902 Attention-deficit hyperactivity disorder, combined type: Secondary | ICD-10-CM | POA: Diagnosis not present

## 2019-10-28 DIAGNOSIS — Z79899 Other long term (current) drug therapy: Secondary | ICD-10-CM | POA: Diagnosis not present

## 2019-10-28 DIAGNOSIS — F419 Anxiety disorder, unspecified: Secondary | ICD-10-CM | POA: Diagnosis not present

## 2019-11-09 DIAGNOSIS — N390 Urinary tract infection, site not specified: Secondary | ICD-10-CM | POA: Diagnosis not present

## 2019-11-09 DIAGNOSIS — N760 Acute vaginitis: Secondary | ICD-10-CM | POA: Diagnosis not present

## 2019-11-14 ENCOUNTER — Telehealth: Payer: Self-pay | Admitting: Emergency Medicine

## 2019-11-14 ENCOUNTER — Ambulatory Visit
Admission: RE | Admit: 2019-11-14 | Discharge: 2019-11-14 | Disposition: A | Discharge status: Home / Self Care | Payer: BC Managed Care – PPO | Source: Ambulatory Visit | Attending: Family Medicine | Admitting: Family Medicine

## 2019-11-14 ENCOUNTER — Other Ambulatory Visit (INDEPENDENT_AMBULATORY_CARE_PROVIDER_SITE_OTHER): Payer: Self-pay | Admitting: Family Medicine

## 2019-11-14 ENCOUNTER — Other Ambulatory Visit: Payer: Self-pay

## 2019-11-14 VITALS — BP 125/81 | HR 84 | Temp 98.3°F | Resp 18

## 2019-11-14 DIAGNOSIS — S161XXA Strain of muscle, fascia and tendon at neck level, initial encounter: Secondary | ICD-10-CM | POA: Diagnosis not present

## 2019-11-14 DIAGNOSIS — S39012A Strain of muscle, fascia and tendon of lower back, initial encounter: Secondary | ICD-10-CM

## 2019-11-14 MED ORDER — METHOCARBAMOL 500 MG PO TABS: 500.0000 mg | ORAL_TABLET | Freq: Two times a day (BID) | ORAL | 0 refills | Status: DC

## 2019-11-14 MED ORDER — MELOXICAM 7.5 MG PO TABS: ORAL_TABLET | ORAL | 0 refills | Status: DC

## 2019-11-14 MED ORDER — MELOXICAM 7.5 MG PO TABS: ORAL_TABLET | ORAL | 0 refills | Status: AC

## 2019-11-14 MED ORDER — KETOROLAC TROMETHAMINE 60 MG/2ML IM SOLN
60.0000 mg | Freq: Once | INTRAMUSCULAR | Status: AC
  Administered 2019-11-14: 60 mg via INTRAMUSCULAR
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
## 2019-11-17 ENCOUNTER — Encounter: Payer: Self-pay | Admitting: Adult Health

## 2019-11-19 NOTE — Telephone Encounter (Signed)
The patient did go to urgent care.  They gave her Toradol injection as well as meloxicam and Robaxin.  I would encourage patient to use these medications for now.  If her symptoms do not slowly start to improve I would be happy to see her sooner.  Certainly if she has worsening symptoms she can let us or her PCP know

## 2020-01-28 DIAGNOSIS — N9489 Other specified conditions associated with female genital organs and menstrual cycle: Secondary | ICD-10-CM | POA: Diagnosis not present

## 2020-01-28 DIAGNOSIS — Z87440 Personal history of urinary (tract) infections: Secondary | ICD-10-CM | POA: Diagnosis not present

## 2020-02-03 ENCOUNTER — Telehealth (INDEPENDENT_AMBULATORY_CARE_PROVIDER_SITE_OTHER): Payer: BC Managed Care – PPO | Admitting: Adult Health

## 2020-02-03 DIAGNOSIS — G43109 Migraine with aura, not intractable, without status migrainosus: Secondary | ICD-10-CM

## 2020-02-03 MED ORDER — RIZATRIPTAN BENZOATE 10 MG PO TBDP: ORAL_TABLET | ORAL | 11 refills | Status: DC

## 2020-02-03 NOTE — Progress Notes (Signed)
PATIENT: Tanya [name] DOB: [birth year]  REASON FOR VISIT: follow up HISTORY FROM: patient  Virtual Visit via Video Note  I connected with Tanya [name] on 02/03/20 at  2:00 PM EST by a video enabled telemedicine application located remotely at [facility] and verified that I am speaking with the correct person using two identifiers who was located at their own home.   I discussed the limitations of evaluation and management by telemedicine and the availability of in person appointments. The patient expressed understanding and agreed to proceed.   PATIENT: Tanya [name] DOB: Nov 26, 1980  REASON FOR VISIT: follow up HISTORY FROM: patient  HISTORY OF PRESENT ILLNESS: Today 02/03/20:  Ms. Bennie is a 39 year old female with a history of migraine headaches.  She returns today for follow-up.  She states it has been quite a while since she had a migraine headache.  She states that she had one 2 weeks ago and has the first when she has had in several months.  She did use frovatriptan and reports that it did resolve the headache but took longer than expected.  She would like to switch back to Maxalt as she is not consistently having migraines during her menstrual cycle.  HISTORY  03/24/19:  Ms. Schow is a 39 year old female with a history of migraine headaches.  She returns today for follow-up.  She reports that since she had Covid in November her headaches have decreased.  She states that November December she had to use 9 tablets of Frova each month.  She states that her headaches have improved slightly in January.  She is also noticed some brain fog after Covid but also feels that this is getting better.  She returns today for an evaluation  REVIEW OF SYSTEMS: Out of a complete 14 system review of symptoms, the patient complains only of the following symptoms, and all other reviewed systems are negative.  See HPI  ALLERGIES: Allergies  Allergen Reactions  .  Compazine [Prochlorperazine Edisylate] Other (See Comments)    HOME MEDICATIONS: Outpatient Medications Prior to Visit  Medication Sig Dispense Refill  . ALPRAZolam (XANAX) 0.25 MG tablet Take 0.25 mg by mouth 3 (three) times daily as needed.    Marland Kitchen escitalopram (LEXAPRO) 10 MG tablet Take 10 mg by mouth daily.  12  . fluconazole (DIFLUCAN) 150 MG tablet Take 1 tablet (150 mg total) by mouth daily. May repeat in 72 hours if needed 2 tablet 0  . frovatriptan (FROVA) 2.5 MG tablet TAKE 1 TABLET BY MOUTH AS NEEDED FOR MIGRAINE. IF RECURS, MAY REPEAT AFTER 2 HOURS. MAX OF 3 TABS IN 24 HOURS. 9 tablet 3  . ibuprofen (ADVIL,MOTRIN) 800 MG tablet Take 1 tablet (800 mg total) by mouth every 8 (eight) hours as needed. 30 tablet 0  . meloxicam (MOBIC) 7.5 MG tablet Take 1 to 2 tablets daily as needed for pain related to inflammation. 30 tablet 0  . methocarbamol (ROBAXIN) 500 MG tablet Take 1 tablet (500 mg total) by mouth 2 (two) times daily. 20 tablet 0  . ondansetron (ZOFRAN) 8 MG tablet Take 1 tablet (8 mg total) by mouth every 8 (eight) hours as needed for nausea or vomiting. 20 tablet 0   No facility-administered medications prior to visit.    PAST MEDICAL HISTORY: Past Medical History:  Diagnosis Date  . ADHD (attention deficit hyperactivity disorder)   . Anemia    postpartum  . Anxiety   . COVID-19   .  GERD (gastroesophageal reflux disease)   . History of chicken pox   . History of pyelonephritis    as child  . IBS (irritable bowel syndrome)   . Infection    UTI  . Interstitial cystitis   . Migraines   . Seasonal allergies   . Tachycardia   . Tilted uterus     PAST SURGICAL HISTORY: Past Surgical History:  Procedure Laterality Date  . CESAREAN SECTION N/A 08/07/2015   Procedure: CESAREAN SECTION;  Surgeon: Marcelle Overlie, MD;  Location: [facility] BIRTHING SUITES;  Service: Obstetrics;  Laterality: N/A;  . CESAREAN SECTION    . dilate and curettage  2016  . TUBAL LIGATION    .  Tubiligation      FAMILY HISTORY: Family History  Problem Relation Age of Onset  . Hypertension Mother   . Hypothyroidism Mother   . Thyroid disease Mother   . Other Mother        benign brain tumor  . Depression Mother   . Hyperlipidemia Mother   . Cancer Maternal Grandfather        breast  . Heart attack Maternal Grandfather   . Hyperlipidemia Maternal Grandfather   . Hypertension Maternal Grandfather   . Breast cancer Maternal Grandfather   . Lupus Paternal Grandmother   . Diabetes Paternal Grandfather     SOCIAL HISTORY: Social History   Socioeconomic History  . Marital status: Married    Spouse name: Not on file  . Number of children: 3  . Years of education: college  . Highest education level: Not on file  Occupational History    Comment: CPA  Tobacco Use  . Smoking status: Never Smoker  . Smokeless tobacco: Never Used  Vaping Use  . Vaping Use: Never used  Substance and Sexual Activity  . Alcohol use: Yes    Alcohol/week: 2.0 standard drinks    Types: 2 Glasses of wine per week    Comment: 1-2 per week  . Drug use: No  . Sexual activity: Yes    Birth control/protection: Surgical  Other Topics Concern  . Not on file  Social History Narrative   Lives at home with spouse, August Saucer, and 3 kids.  Works at [employer].  Education: college.  Caffeine 1-2 per day.    Social Determinants of Health   Financial Resource Strain: Not on file  Food Insecurity: Not on file  Transportation Needs: Not on file  Physical Activity: Not on file  Stress: Not on file  Social Connections: Not on file  Intimate Partner Violence: Not on file      PHYSICAL EXAM Generalized: Well developed, in no acute distress   Neurological examination  Mentation: Alert oriented to time, place, history taking. Follows all commands speech and language fluent Cranial nerve II-XII:Extraocular movements were full. Facial symmetry noted. uvula tongue midline. Head turning and  shoulder shrug  were normal and symmetric. Motor: Good strength throughout subjectively per patient Sensory: Sensory testing is intact to soft touch on all 4 extremities subjectively per patient Coordination: Cerebellar testing reveals good finger-nose-finger  Gait and station: Patient is able to stand from a seated position. gait is normal.  Reflexes: UTA  DIAGNOSTIC DATA (LABS, IMAGING, TESTING) - I reviewed patient records, labs, notes, testing and imaging myself where available.  Lab Results  Component Value Date   WBC 8.2 01/06/2019   HGB 14.1 01/06/2019   HCT 42.7 01/06/2019   MCV 89.7 01/06/2019   PLT 234 01/06/2019  Component Value Date/Time   NA 139 01/06/2019 1835   NA 139 05/19/2018 1451   K 4.3 01/06/2019 1835   CL 104 01/06/2019 1835   CO2 27 01/06/2019 1835   GLUCOSE 109 (H) 01/06/2019 1835   BUN 17 01/06/2019 1835   BUN 14 05/19/2018 1451   CREATININE 0.93 01/06/2019 1835   CREATININE 0.78 04/23/2013 0916   CALCIUM 9.3 01/06/2019 1835   PROT 6.8 05/19/2018 1451   ALBUMIN 4.5 05/19/2018 1451   AST 19 05/19/2018 1451   ALT 16 05/19/2018 1451   ALKPHOS 59 05/19/2018 1451   BILITOT 0.3 05/19/2018 1451   GFRNONAA >60 01/06/2019 1835   GFRAA >60 01/06/2019 1835   Lab Results  Component Value Date   CHOL 181 01/16/2018   HDL 57 01/16/2018   LDLCALC 109 (H) 01/16/2018   TRIG 73 01/16/2018   CHOLHDL 3.2 01/16/2018   Lab Results  Component Value Date   HGBA1C 5.1 01/16/2018   Lab Results  Component Value Date   VITAMINB12 399 09/18/2018   Lab Results  Component Value Date   TSH 2.110 09/18/2018      ASSESSMENT AND PLAN 39 y.o. year old female  has a past medical history of ADHD (attention deficit hyperactivity disorder), Anemia, Anxiety, COVID-19, GERD (gastroesophageal reflux disease), History of chicken pox, History of pyelonephritis, IBS (irritable bowel syndrome), Infection, Interstitial cystitis, Migraines, Seasonal allergies, Tachycardia,  and Tilted uterus. here with:  1.  Migraine headaches  --Start frovatriptan, will try Maxalt 10 mg take at the onset of a migraine and repeat in 2 hours if needed --Advised if headache frequency or severity worsen she should let us know --Follow-up in 1 year or sooner if needed   I spent 20 minutes of face-to-face and non-face-to-face time with patient.  This included previsit chart review, lab review, study review, order entry, electronic health record documentation, patient education.    Butch Penny, MSN, NP-C 02/03/2020, 1:54 PM [facility] [address] [place], [place] [address] [phone]

## 2020-02-11 ENCOUNTER — Other Ambulatory Visit: Payer: BC Managed Care – PPO

## 2020-03-02 ENCOUNTER — Other Ambulatory Visit: Payer: Self-pay | Admitting: Sports Medicine

## 2020-03-02 ENCOUNTER — Ambulatory Visit
Admission: RE | Admit: 2020-03-02 | Discharge: 2020-03-02 | Disposition: A | Discharge status: Home / Self Care | Payer: BC Managed Care – PPO | Source: Ambulatory Visit | Attending: Sports Medicine | Admitting: Sports Medicine

## 2020-03-02 ENCOUNTER — Other Ambulatory Visit: Payer: Self-pay

## 2020-03-02 DIAGNOSIS — M25512 Pain in left shoulder: Secondary | ICD-10-CM

## 2020-03-15 DIAGNOSIS — L814 Other melanin hyperpigmentation: Secondary | ICD-10-CM | POA: Diagnosis not present

## 2020-03-15 DIAGNOSIS — L300 Nummular dermatitis: Secondary | ICD-10-CM | POA: Diagnosis not present

## 2020-03-15 DIAGNOSIS — L110 Acquired keratosis follicularis: Secondary | ICD-10-CM | POA: Diagnosis not present

## 2020-03-15 DIAGNOSIS — D225 Melanocytic nevi of trunk: Secondary | ICD-10-CM | POA: Diagnosis not present

## 2020-03-16 DIAGNOSIS — M9901 Segmental and somatic dysfunction of cervical region: Secondary | ICD-10-CM | POA: Diagnosis not present

## 2020-03-16 DIAGNOSIS — M9902 Segmental and somatic dysfunction of thoracic region: Secondary | ICD-10-CM | POA: Diagnosis not present

## 2020-03-16 DIAGNOSIS — F0781 Postconcussional syndrome: Secondary | ICD-10-CM | POA: Diagnosis not present

## 2020-03-16 DIAGNOSIS — M9903 Segmental and somatic dysfunction of lumbar region: Secondary | ICD-10-CM | POA: Diagnosis not present

## 2020-03-18 DIAGNOSIS — M542 Cervicalgia: Secondary | ICD-10-CM | POA: Diagnosis not present

## 2020-03-18 DIAGNOSIS — M532X2 Spinal instabilities, cervical region: Secondary | ICD-10-CM | POA: Diagnosis not present

## 2020-03-22 DIAGNOSIS — M9902 Segmental and somatic dysfunction of thoracic region: Secondary | ICD-10-CM | POA: Diagnosis not present

## 2020-03-22 DIAGNOSIS — M9901 Segmental and somatic dysfunction of cervical region: Secondary | ICD-10-CM | POA: Diagnosis not present

## 2020-03-22 DIAGNOSIS — M9903 Segmental and somatic dysfunction of lumbar region: Secondary | ICD-10-CM | POA: Diagnosis not present

## 2020-03-22 DIAGNOSIS — F0781 Postconcussional syndrome: Secondary | ICD-10-CM | POA: Diagnosis not present

## 2020-04-03 DIAGNOSIS — M25561 Pain in right knee: Secondary | ICD-10-CM | POA: Diagnosis not present

## 2020-04-05 DIAGNOSIS — M532X2 Spinal instabilities, cervical region: Secondary | ICD-10-CM | POA: Diagnosis not present

## 2020-04-05 DIAGNOSIS — M9901 Segmental and somatic dysfunction of cervical region: Secondary | ICD-10-CM | POA: Diagnosis not present

## 2020-04-05 DIAGNOSIS — M9902 Segmental and somatic dysfunction of thoracic region: Secondary | ICD-10-CM | POA: Diagnosis not present

## 2020-04-05 DIAGNOSIS — M542 Cervicalgia: Secondary | ICD-10-CM | POA: Diagnosis not present

## 2020-04-05 DIAGNOSIS — F0781 Postconcussional syndrome: Secondary | ICD-10-CM | POA: Diagnosis not present

## 2020-04-05 DIAGNOSIS — M9903 Segmental and somatic dysfunction of lumbar region: Secondary | ICD-10-CM | POA: Diagnosis not present

## 2020-04-11 DIAGNOSIS — M532X2 Spinal instabilities, cervical region: Secondary | ICD-10-CM | POA: Diagnosis not present

## 2020-04-11 DIAGNOSIS — M542 Cervicalgia: Secondary | ICD-10-CM | POA: Diagnosis not present

## 2020-04-11 DIAGNOSIS — M9902 Segmental and somatic dysfunction of thoracic region: Secondary | ICD-10-CM | POA: Diagnosis not present

## 2020-04-11 DIAGNOSIS — M7041 Prepatellar bursitis, right knee: Secondary | ICD-10-CM | POA: Diagnosis not present

## 2020-04-11 DIAGNOSIS — M25561 Pain in right knee: Secondary | ICD-10-CM | POA: Diagnosis not present

## 2020-04-11 DIAGNOSIS — F0781 Postconcussional syndrome: Secondary | ICD-10-CM | POA: Diagnosis not present

## 2020-04-11 DIAGNOSIS — M9903 Segmental and somatic dysfunction of lumbar region: Secondary | ICD-10-CM | POA: Diagnosis not present

## 2020-04-11 DIAGNOSIS — M25512 Pain in left shoulder: Secondary | ICD-10-CM | POA: Diagnosis not present

## 2020-04-11 DIAGNOSIS — M9901 Segmental and somatic dysfunction of cervical region: Secondary | ICD-10-CM | POA: Diagnosis not present

## 2020-04-18 DIAGNOSIS — Z79899 Other long term (current) drug therapy: Secondary | ICD-10-CM | POA: Diagnosis not present

## 2020-04-18 DIAGNOSIS — F902 Attention-deficit hyperactivity disorder, combined type: Secondary | ICD-10-CM | POA: Diagnosis not present

## 2020-04-19 DIAGNOSIS — M9901 Segmental and somatic dysfunction of cervical region: Secondary | ICD-10-CM | POA: Diagnosis not present

## 2020-04-19 DIAGNOSIS — M9902 Segmental and somatic dysfunction of thoracic region: Secondary | ICD-10-CM | POA: Diagnosis not present

## 2020-04-19 DIAGNOSIS — Z01419 Encounter for gynecological examination (general) (routine) without abnormal findings: Secondary | ICD-10-CM | POA: Diagnosis not present

## 2020-04-19 DIAGNOSIS — Z1231 Encounter for screening mammogram for malignant neoplasm of breast: Secondary | ICD-10-CM | POA: Diagnosis not present

## 2020-04-19 DIAGNOSIS — F0781 Postconcussional syndrome: Secondary | ICD-10-CM | POA: Diagnosis not present

## 2020-04-19 DIAGNOSIS — Z6831 Body mass index (BMI) 31.0-31.9, adult: Secondary | ICD-10-CM | POA: Diagnosis not present

## 2020-04-19 DIAGNOSIS — M532X2 Spinal instabilities, cervical region: Secondary | ICD-10-CM | POA: Diagnosis not present

## 2020-04-19 DIAGNOSIS — M9903 Segmental and somatic dysfunction of lumbar region: Secondary | ICD-10-CM | POA: Diagnosis not present

## 2020-04-19 DIAGNOSIS — M542 Cervicalgia: Secondary | ICD-10-CM | POA: Diagnosis not present

## 2020-04-19 LAB — HM PAP SMEAR: HM Pap smear: NEGATIVE

## 2020-04-20 DIAGNOSIS — M9901 Segmental and somatic dysfunction of cervical region: Secondary | ICD-10-CM | POA: Diagnosis not present

## 2020-04-20 DIAGNOSIS — F0781 Postconcussional syndrome: Secondary | ICD-10-CM | POA: Diagnosis not present

## 2020-04-20 DIAGNOSIS — M9903 Segmental and somatic dysfunction of lumbar region: Secondary | ICD-10-CM | POA: Diagnosis not present

## 2020-04-20 DIAGNOSIS — M9902 Segmental and somatic dysfunction of thoracic region: Secondary | ICD-10-CM | POA: Diagnosis not present

## 2020-05-11 DIAGNOSIS — M25561 Pain in right knee: Secondary | ICD-10-CM | POA: Diagnosis not present

## 2020-05-11 DIAGNOSIS — M79651 Pain in right thigh: Secondary | ICD-10-CM | POA: Diagnosis not present

## 2020-05-11 DIAGNOSIS — M7041 Prepatellar bursitis, right knee: Secondary | ICD-10-CM | POA: Diagnosis not present

## 2020-05-30 DIAGNOSIS — M9905 Segmental and somatic dysfunction of pelvic region: Secondary | ICD-10-CM | POA: Diagnosis not present

## 2020-05-30 DIAGNOSIS — M9903 Segmental and somatic dysfunction of lumbar region: Secondary | ICD-10-CM | POA: Diagnosis not present

## 2020-05-30 DIAGNOSIS — M9902 Segmental and somatic dysfunction of thoracic region: Secondary | ICD-10-CM | POA: Diagnosis not present

## 2020-05-30 DIAGNOSIS — M9906 Segmental and somatic dysfunction of lower extremity: Secondary | ICD-10-CM | POA: Diagnosis not present

## 2020-06-10 ENCOUNTER — Other Ambulatory Visit: Payer: Self-pay

## 2020-06-10 ENCOUNTER — Ambulatory Visit
Admission: EM | Admit: 2020-06-10 | Discharge: 2020-06-10 | Disposition: A | Discharge status: Home / Self Care | Payer: BC Managed Care – PPO

## 2020-06-10 ENCOUNTER — Encounter: Payer: Self-pay | Admitting: Emergency Medicine

## 2020-06-10 DIAGNOSIS — R5383 Other fatigue: Secondary | ICD-10-CM | POA: Diagnosis not present

## 2020-06-10 DIAGNOSIS — S30861A Insect bite (nonvenomous) of abdominal wall, initial encounter: Secondary | ICD-10-CM | POA: Diagnosis not present

## 2020-06-10 DIAGNOSIS — B882 Other arthropod infestations: Secondary | ICD-10-CM

## 2020-06-10 DIAGNOSIS — W57XXXA Bitten or stung by nonvenomous insect and other nonvenomous arthropods, initial encounter: Secondary | ICD-10-CM

## 2020-06-10 DIAGNOSIS — M2550 Pain in unspecified joint: Secondary | ICD-10-CM

## 2020-06-10 MED ORDER — TRIAMCINOLONE ACETONIDE 0.5 % EX OINT: 1.0000 "application " | TOPICAL_OINTMENT | Freq: Two times a day (BID) | CUTANEOUS | 0 refills | Status: AC

## 2020-06-10 MED ORDER — DOXYCYCLINE HYCLATE 100 MG PO CAPS: 100.0000 mg | ORAL_CAPSULE | Freq: Two times a day (BID) | ORAL | 0 refills | Status: AC

## 2020-06-10 NOTE — Discharge Instructions (Signed)
It is possible your symptoms could be related to tickborne disease.  If we tested you for tick disease it may or may not be too soon.  You have decided just go ahead and get treated for it so sent doxycycline to the pharmacy at this time.  Have also sent an ointment for the tick bite area.  Try not to scratch.  If your symptoms are continuing after the next week or you have any new or worsening symptoms, please return.  You do need a PCP.  You can contact your insurance to find a covered provider.  I have included some information for member and primary care below.  [facility] at [facility], [address], [address]. Phone number: [phone] Please call this number to establish with primary care

## 2020-06-10 NOTE — ED Triage Notes (Signed)
Patient states that she had a tick bite a month ago.  Patient states 2 weeks ago she started having some bodyaches and fatigue.  Patient denies fevers.  Patient states that she still has some itching at the site.
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
## 2020-06-20 DIAGNOSIS — M9905 Segmental and somatic dysfunction of pelvic region: Secondary | ICD-10-CM | POA: Diagnosis not present

## 2020-06-20 DIAGNOSIS — M9902 Segmental and somatic dysfunction of thoracic region: Secondary | ICD-10-CM | POA: Diagnosis not present

## 2020-06-20 DIAGNOSIS — M9903 Segmental and somatic dysfunction of lumbar region: Secondary | ICD-10-CM | POA: Diagnosis not present

## 2020-06-20 DIAGNOSIS — M9906 Segmental and somatic dysfunction of lower extremity: Secondary | ICD-10-CM | POA: Diagnosis not present

## 2020-06-22 DIAGNOSIS — M25511 Pain in right shoulder: Secondary | ICD-10-CM | POA: Diagnosis not present

## 2020-06-22 DIAGNOSIS — M25311 Other instability, right shoulder: Secondary | ICD-10-CM | POA: Diagnosis not present

## 2020-06-22 DIAGNOSIS — M7551 Bursitis of right shoulder: Secondary | ICD-10-CM | POA: Diagnosis not present

## 2020-06-22 DIAGNOSIS — M25561 Pain in right knee: Secondary | ICD-10-CM | POA: Diagnosis not present

## 2020-07-13 DIAGNOSIS — Z79899 Other long term (current) drug therapy: Secondary | ICD-10-CM | POA: Diagnosis not present

## 2020-07-13 DIAGNOSIS — F902 Attention-deficit hyperactivity disorder, combined type: Secondary | ICD-10-CM | POA: Diagnosis not present

## 2020-07-13 DIAGNOSIS — F419 Anxiety disorder, unspecified: Secondary | ICD-10-CM | POA: Diagnosis not present

## 2020-07-18 ENCOUNTER — Ambulatory Visit: Admit: 2020-07-18 | Disposition: A | Discharge status: Home / Self Care | Payer: BC Managed Care – PPO

## 2020-07-25 DIAGNOSIS — M722 Plantar fascial fibromatosis: Secondary | ICD-10-CM | POA: Diagnosis not present

## 2020-07-25 DIAGNOSIS — M25311 Other instability, right shoulder: Secondary | ICD-10-CM | POA: Diagnosis not present

## 2020-07-25 DIAGNOSIS — M9906 Segmental and somatic dysfunction of lower extremity: Secondary | ICD-10-CM | POA: Diagnosis not present

## 2020-07-25 DIAGNOSIS — M9903 Segmental and somatic dysfunction of lumbar region: Secondary | ICD-10-CM | POA: Diagnosis not present

## 2020-07-25 DIAGNOSIS — M5451 Vertebrogenic low back pain: Secondary | ICD-10-CM | POA: Diagnosis not present

## 2020-07-25 DIAGNOSIS — M9905 Segmental and somatic dysfunction of pelvic region: Secondary | ICD-10-CM | POA: Diagnosis not present

## 2020-07-25 DIAGNOSIS — M9902 Segmental and somatic dysfunction of thoracic region: Secondary | ICD-10-CM | POA: Diagnosis not present

## 2020-07-25 DIAGNOSIS — M25511 Pain in right shoulder: Secondary | ICD-10-CM | POA: Diagnosis not present

## 2020-07-25 DIAGNOSIS — M542 Cervicalgia: Secondary | ICD-10-CM | POA: Diagnosis not present

## 2020-08-10 DIAGNOSIS — M9906 Segmental and somatic dysfunction of lower extremity: Secondary | ICD-10-CM | POA: Diagnosis not present

## 2020-08-10 DIAGNOSIS — M9902 Segmental and somatic dysfunction of thoracic region: Secondary | ICD-10-CM | POA: Diagnosis not present

## 2020-08-10 DIAGNOSIS — M7541 Impingement syndrome of right shoulder: Secondary | ICD-10-CM | POA: Diagnosis not present

## 2020-08-10 DIAGNOSIS — M9905 Segmental and somatic dysfunction of pelvic region: Secondary | ICD-10-CM | POA: Diagnosis not present

## 2020-08-10 DIAGNOSIS — M9903 Segmental and somatic dysfunction of lumbar region: Secondary | ICD-10-CM | POA: Diagnosis not present

## 2020-08-22 DIAGNOSIS — M542 Cervicalgia: Secondary | ICD-10-CM | POA: Diagnosis not present

## 2020-08-22 DIAGNOSIS — M25311 Other instability, right shoulder: Secondary | ICD-10-CM | POA: Diagnosis not present

## 2020-08-22 DIAGNOSIS — M25511 Pain in right shoulder: Secondary | ICD-10-CM | POA: Diagnosis not present

## 2020-08-22 DIAGNOSIS — M9901 Segmental and somatic dysfunction of cervical region: Secondary | ICD-10-CM | POA: Diagnosis not present

## 2020-08-31 DIAGNOSIS — M9902 Segmental and somatic dysfunction of thoracic region: Secondary | ICD-10-CM | POA: Diagnosis not present

## 2020-08-31 DIAGNOSIS — M9903 Segmental and somatic dysfunction of lumbar region: Secondary | ICD-10-CM | POA: Diagnosis not present

## 2020-08-31 DIAGNOSIS — M9905 Segmental and somatic dysfunction of pelvic region: Secondary | ICD-10-CM | POA: Diagnosis not present

## 2020-08-31 DIAGNOSIS — M9906 Segmental and somatic dysfunction of lower extremity: Secondary | ICD-10-CM | POA: Diagnosis not present

## 2020-08-31 DIAGNOSIS — M7541 Impingement syndrome of right shoulder: Secondary | ICD-10-CM | POA: Diagnosis not present

## 2020-09-01 DIAGNOSIS — L308 Other specified dermatitis: Secondary | ICD-10-CM | POA: Diagnosis not present

## 2020-09-21 DIAGNOSIS — M9906 Segmental and somatic dysfunction of lower extremity: Secondary | ICD-10-CM | POA: Diagnosis not present

## 2020-09-21 DIAGNOSIS — M9902 Segmental and somatic dysfunction of thoracic region: Secondary | ICD-10-CM | POA: Diagnosis not present

## 2020-09-21 DIAGNOSIS — M7541 Impingement syndrome of right shoulder: Secondary | ICD-10-CM | POA: Diagnosis not present

## 2020-09-21 DIAGNOSIS — M9905 Segmental and somatic dysfunction of pelvic region: Secondary | ICD-10-CM | POA: Diagnosis not present

## 2020-09-21 DIAGNOSIS — M9903 Segmental and somatic dysfunction of lumbar region: Secondary | ICD-10-CM | POA: Diagnosis not present

## 2020-09-28 DIAGNOSIS — M9901 Segmental and somatic dysfunction of cervical region: Secondary | ICD-10-CM | POA: Diagnosis not present

## 2020-09-28 DIAGNOSIS — M25511 Pain in right shoulder: Secondary | ICD-10-CM | POA: Diagnosis not present

## 2020-09-28 DIAGNOSIS — M9902 Segmental and somatic dysfunction of thoracic region: Secondary | ICD-10-CM | POA: Diagnosis not present

## 2020-09-28 DIAGNOSIS — M542 Cervicalgia: Secondary | ICD-10-CM | POA: Diagnosis not present

## 2020-10-05 DIAGNOSIS — M7541 Impingement syndrome of right shoulder: Secondary | ICD-10-CM | POA: Diagnosis not present

## 2020-10-05 DIAGNOSIS — N920 Excessive and frequent menstruation with regular cycle: Secondary | ICD-10-CM | POA: Diagnosis not present

## 2020-10-05 DIAGNOSIS — R635 Abnormal weight gain: Secondary | ICD-10-CM | POA: Diagnosis not present

## 2020-10-05 DIAGNOSIS — M9903 Segmental and somatic dysfunction of lumbar region: Secondary | ICD-10-CM | POA: Diagnosis not present

## 2020-10-05 DIAGNOSIS — M9906 Segmental and somatic dysfunction of lower extremity: Secondary | ICD-10-CM | POA: Diagnosis not present

## 2020-10-05 DIAGNOSIS — M9902 Segmental and somatic dysfunction of thoracic region: Secondary | ICD-10-CM | POA: Diagnosis not present

## 2020-10-05 DIAGNOSIS — M9905 Segmental and somatic dysfunction of pelvic region: Secondary | ICD-10-CM | POA: Diagnosis not present

## 2020-10-12 DIAGNOSIS — M9902 Segmental and somatic dysfunction of thoracic region: Secondary | ICD-10-CM | POA: Diagnosis not present

## 2020-10-12 DIAGNOSIS — M9905 Segmental and somatic dysfunction of pelvic region: Secondary | ICD-10-CM | POA: Diagnosis not present

## 2020-10-12 DIAGNOSIS — M9906 Segmental and somatic dysfunction of lower extremity: Secondary | ICD-10-CM | POA: Diagnosis not present

## 2020-10-12 DIAGNOSIS — Z79899 Other long term (current) drug therapy: Secondary | ICD-10-CM | POA: Diagnosis not present

## 2020-10-12 DIAGNOSIS — M7541 Impingement syndrome of right shoulder: Secondary | ICD-10-CM | POA: Diagnosis not present

## 2020-10-12 DIAGNOSIS — F902 Attention-deficit hyperactivity disorder, combined type: Secondary | ICD-10-CM | POA: Diagnosis not present

## 2020-10-12 DIAGNOSIS — M9903 Segmental and somatic dysfunction of lumbar region: Secondary | ICD-10-CM | POA: Diagnosis not present

## 2020-10-14 DIAGNOSIS — Z131 Encounter for screening for diabetes mellitus: Secondary | ICD-10-CM | POA: Diagnosis not present

## 2020-10-14 DIAGNOSIS — Z1329 Encounter for screening for other suspected endocrine disorder: Secondary | ICD-10-CM | POA: Diagnosis not present

## 2020-10-14 DIAGNOSIS — Z130 Encounter for screening for diseases of the blood and blood-forming organs and certain disorders involving the immune mechanism: Secondary | ICD-10-CM | POA: Diagnosis not present

## 2020-10-14 DIAGNOSIS — Z13228 Encounter for screening for other metabolic disorders: Secondary | ICD-10-CM | POA: Diagnosis not present

## 2020-10-26 DIAGNOSIS — M542 Cervicalgia: Secondary | ICD-10-CM | POA: Diagnosis not present

## 2020-10-26 DIAGNOSIS — M9901 Segmental and somatic dysfunction of cervical region: Secondary | ICD-10-CM | POA: Diagnosis not present

## 2020-10-26 DIAGNOSIS — M25511 Pain in right shoulder: Secondary | ICD-10-CM | POA: Diagnosis not present

## 2020-10-26 DIAGNOSIS — M545 Low back pain, unspecified: Secondary | ICD-10-CM | POA: Diagnosis not present

## 2020-10-31 DIAGNOSIS — M9903 Segmental and somatic dysfunction of lumbar region: Secondary | ICD-10-CM | POA: Diagnosis not present

## 2020-10-31 DIAGNOSIS — M9905 Segmental and somatic dysfunction of pelvic region: Secondary | ICD-10-CM | POA: Diagnosis not present

## 2020-10-31 DIAGNOSIS — M9906 Segmental and somatic dysfunction of lower extremity: Secondary | ICD-10-CM | POA: Diagnosis not present

## 2020-10-31 DIAGNOSIS — M9902 Segmental and somatic dysfunction of thoracic region: Secondary | ICD-10-CM | POA: Diagnosis not present

## 2020-10-31 DIAGNOSIS — M7541 Impingement syndrome of right shoulder: Secondary | ICD-10-CM | POA: Diagnosis not present

## 2020-11-07 DIAGNOSIS — M7541 Impingement syndrome of right shoulder: Secondary | ICD-10-CM | POA: Diagnosis not present

## 2020-11-07 DIAGNOSIS — M9906 Segmental and somatic dysfunction of lower extremity: Secondary | ICD-10-CM | POA: Diagnosis not present

## 2020-11-07 DIAGNOSIS — M9902 Segmental and somatic dysfunction of thoracic region: Secondary | ICD-10-CM | POA: Diagnosis not present

## 2020-11-07 DIAGNOSIS — M9903 Segmental and somatic dysfunction of lumbar region: Secondary | ICD-10-CM | POA: Diagnosis not present

## 2020-11-07 DIAGNOSIS — M9905 Segmental and somatic dysfunction of pelvic region: Secondary | ICD-10-CM | POA: Diagnosis not present

## 2020-11-16 DIAGNOSIS — M546 Pain in thoracic spine: Secondary | ICD-10-CM | POA: Diagnosis not present

## 2020-11-16 DIAGNOSIS — M25511 Pain in right shoulder: Secondary | ICD-10-CM | POA: Diagnosis not present

## 2020-11-16 DIAGNOSIS — M542 Cervicalgia: Secondary | ICD-10-CM | POA: Diagnosis not present

## 2020-11-16 DIAGNOSIS — M5451 Vertebrogenic low back pain: Secondary | ICD-10-CM | POA: Diagnosis not present

## 2020-11-23 IMAGING — MG DIGITAL DIAGNOSTIC UNILATERAL RIGHT MAMMOGRAM WITH TOMO AND CAD
6 series · 6 of 18 positions shown · non-contrast
Comparison: Previous exam(s).

CLINICAL DATA: The patient was called back from screening
mammography due to a possible right breast.

EXAM:
DIGITAL DIAGNOSTIC RIGHT MAMMOGRAM WITH CAD AND TOMO
ULTRASOUND RIGHT BREAST

[R CC synth-2D (1 of 2)]
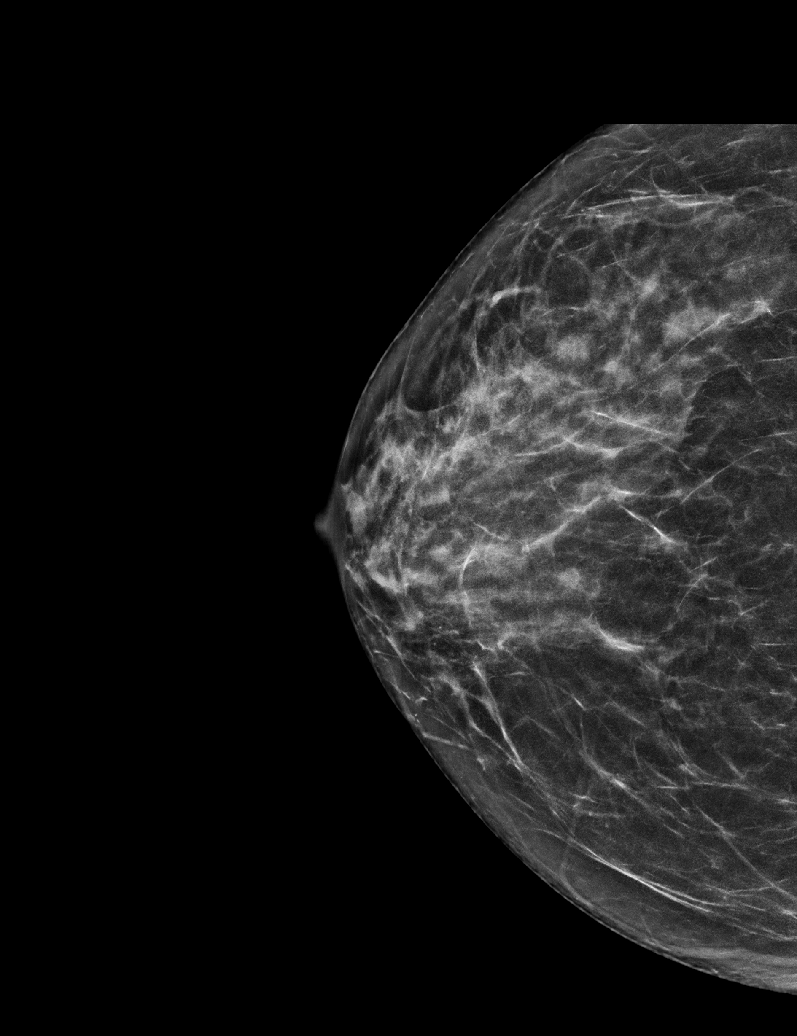

[R MLO synth-2D]
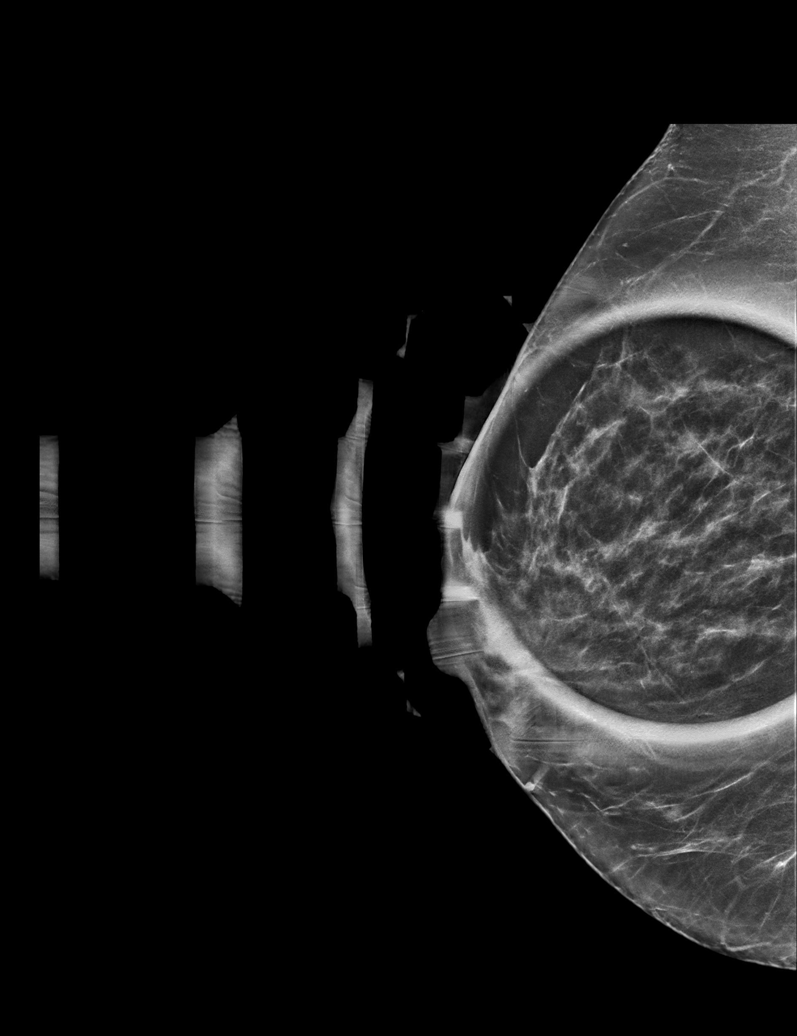

[R CC synth-2D (2 of 2)]
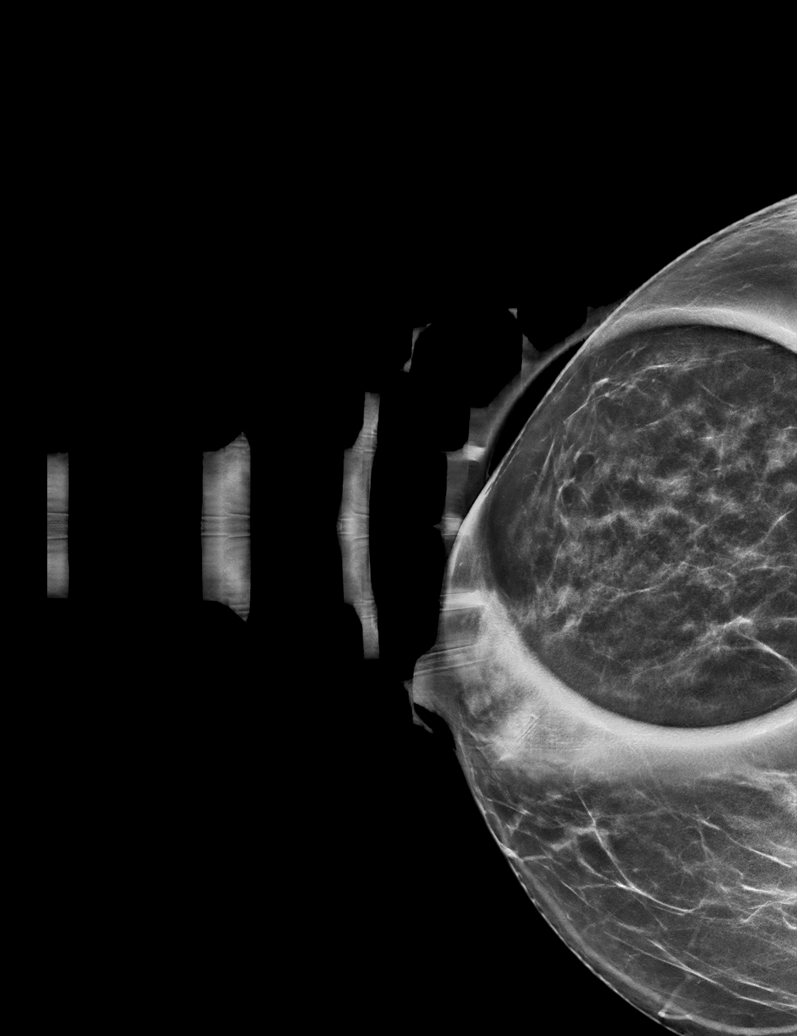

[R MLO tomo · tomo slice 31/60.0]
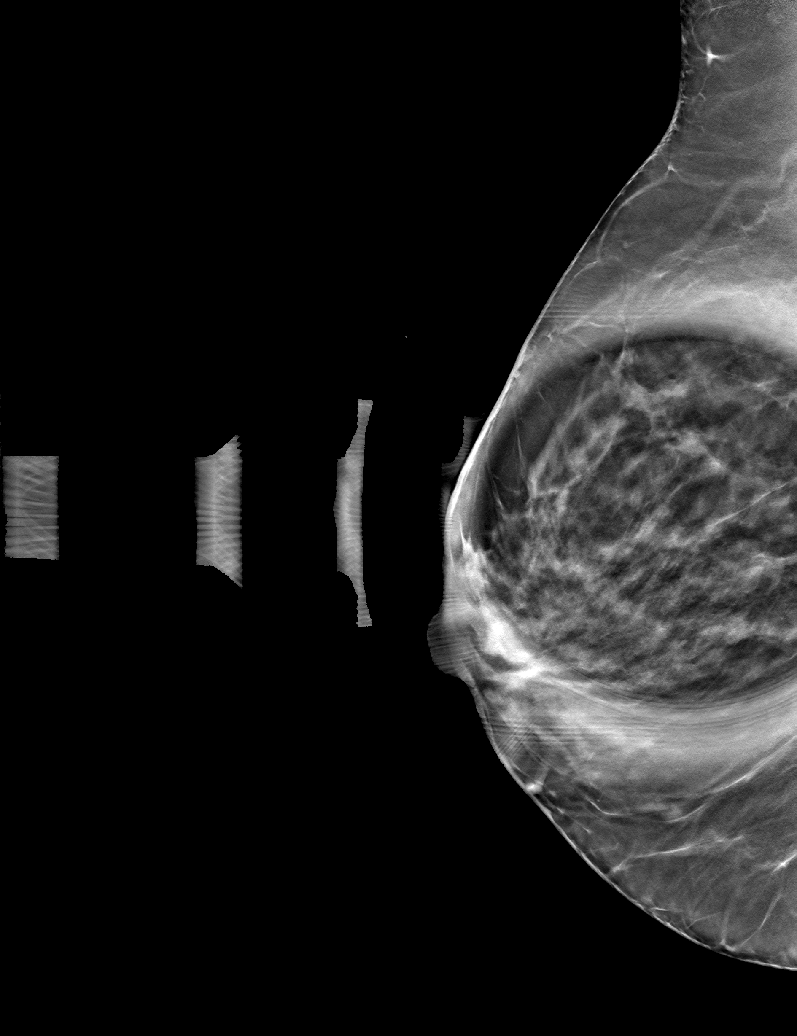

[R CC tomo (1 of 2) · tomo slice 28/55.0]
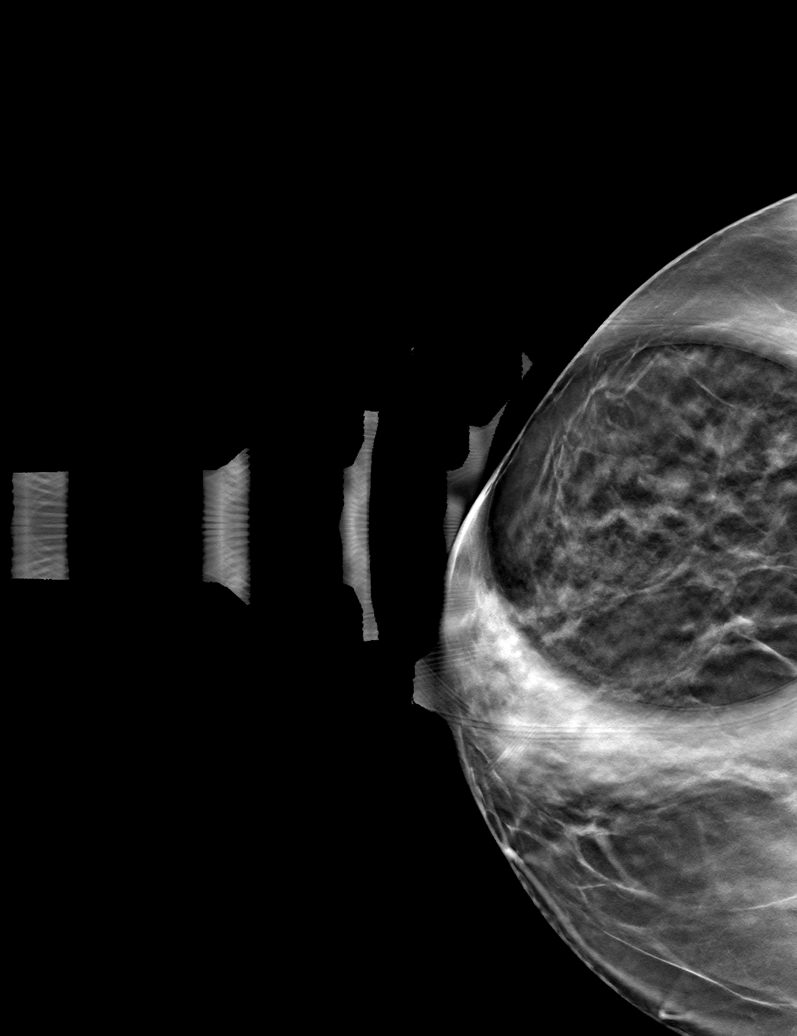

[R CC tomo (2 of 2) · tomo slice 31/61.0]
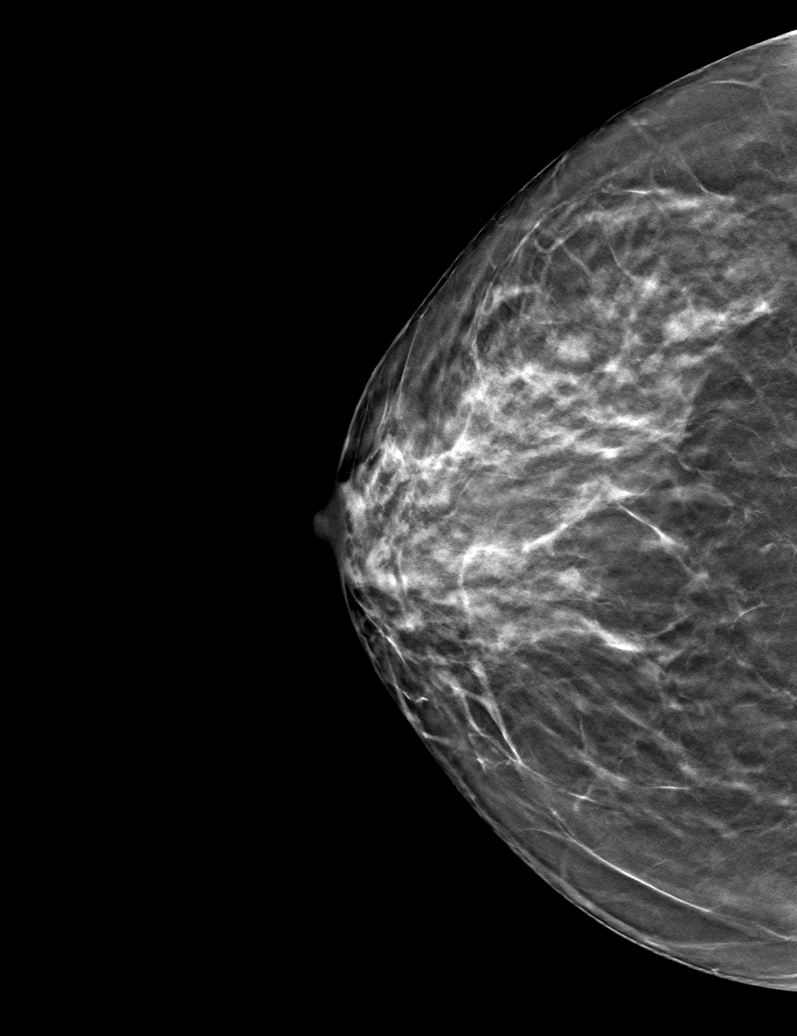

[6 of 18 positions shown; findings below may reference images not displayed]

ACR Breast Density Category c: The breast tissue is heterogeneously
dense, which may obscure small masses.
FINDINGS: The possible lateral right breast mass resolves on spot imaging. It
improves on repeat imaging.

Mammographic images were processed with CAD.

On physical exam, no suspicious lumps are identified.

Targeted ultrasound is performed, showing a cluster of cysts at 10
o'clock, 3 cm from the nipple accounting for the mammographic
finding. No other suspicious abnormalities in the lateral right
breast.
IMPRESSION: Fibrocystic changes.  No evidence of malignancy.

RECOMMENDATION:
Annual screening mammography.

I have discussed the findings and recommendations with the patient.
Results were also provided in writing at the conclusion of the
visit. If applicable, a reminder letter will be sent to the patient
regarding the next appointment.

BI-RADS CATEGORY  2: Benign.

## 2020-11-23 IMAGING — US ULTRASOUND RIGHT BREAST LIMITED
1 series · 4 of 4 positions shown · non-contrast
Comparison: Previous exam(s).

CLINICAL DATA: The patient was called back from screening
mammography due to a possible right breast.

EXAM:
DIGITAL DIAGNOSTIC RIGHT MAMMOGRAM WITH CAD AND TOMO
ULTRASOUND RIGHT BREAST

[Series 1: ultrasound right breast limited · 0.07mm/px · 4 of 4 slices shown]
[im 1/4]
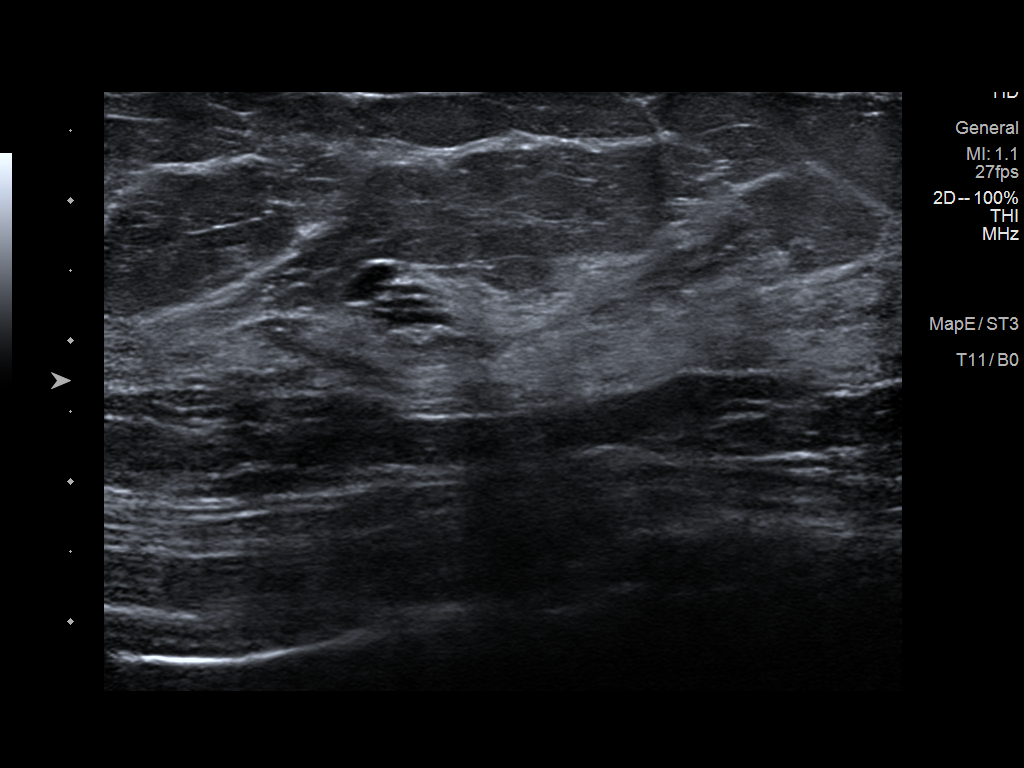
[im 2/4]
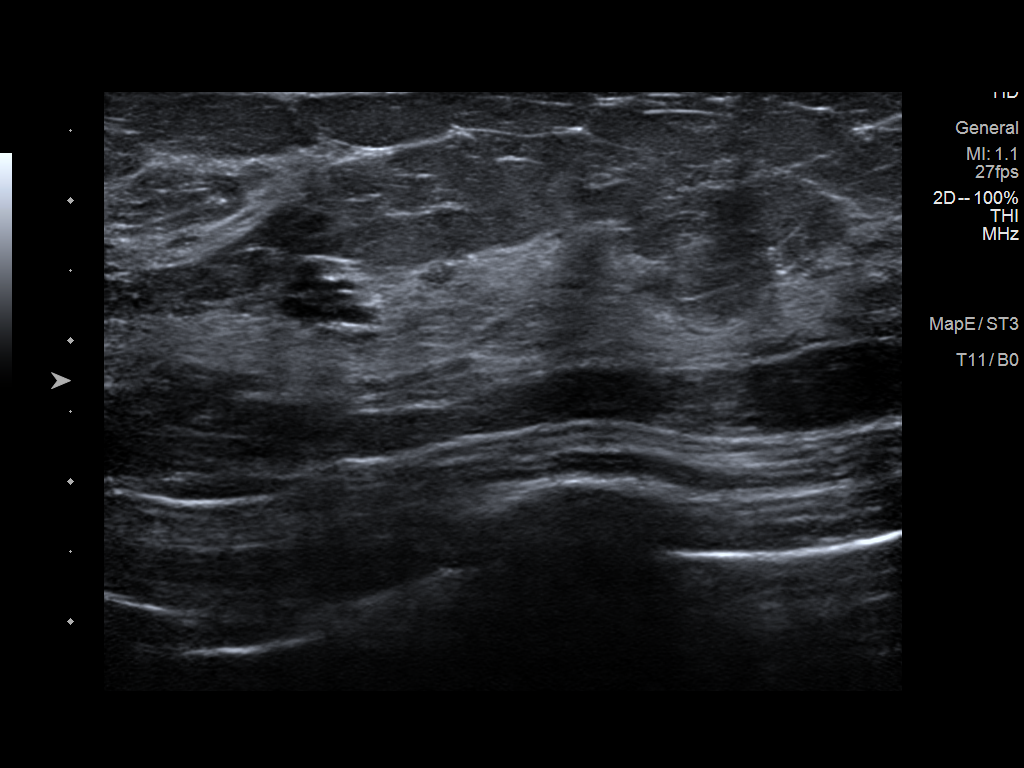
[im 3/4]
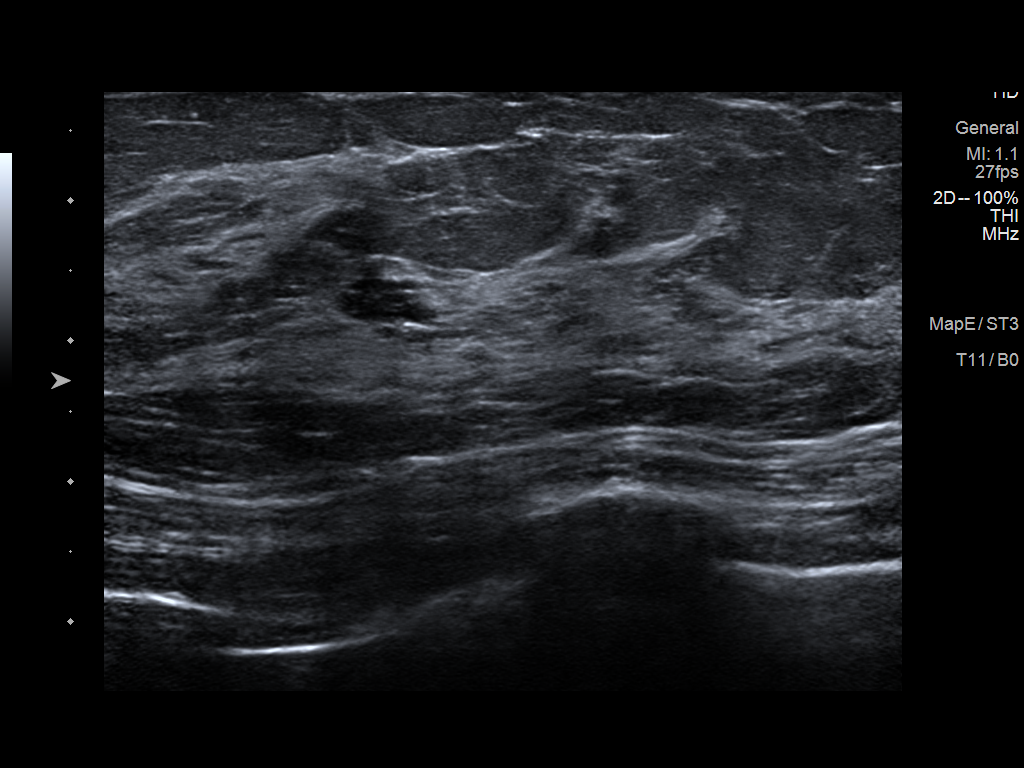
[im 4/4]
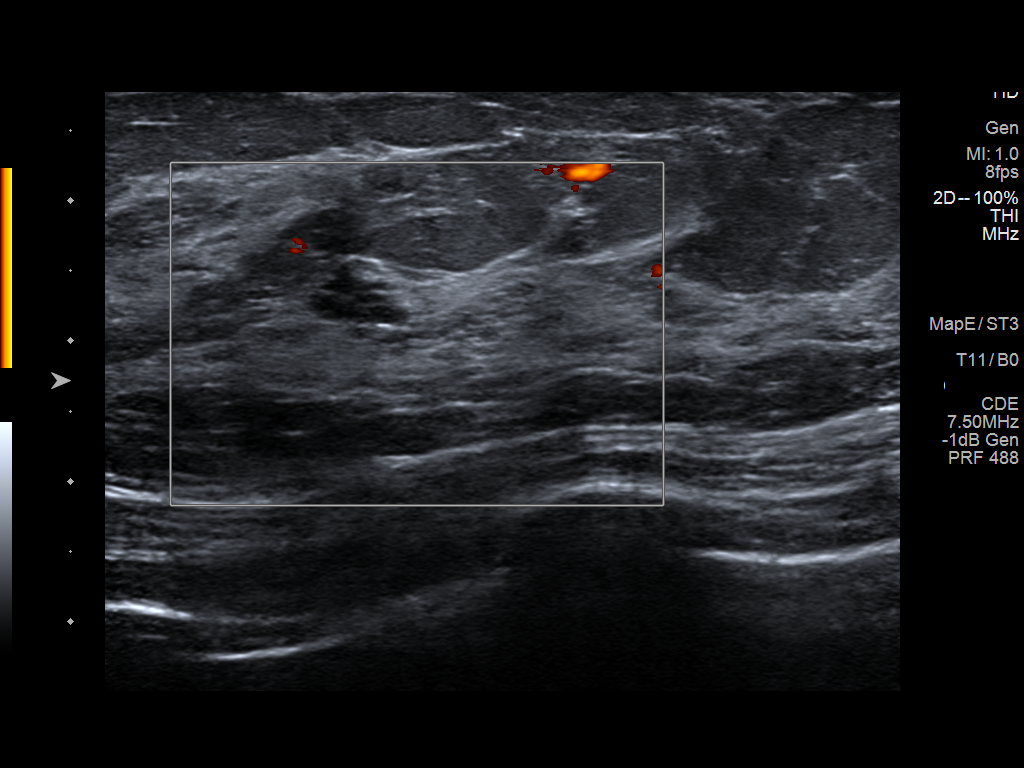

[4 of 4 positions shown; findings below may reference images not displayed]

ACR Breast Density Category c: The breast tissue is heterogeneously
dense, which may obscure small masses.
FINDINGS: The possible lateral right breast mass resolves on spot imaging. It
improves on repeat imaging.

Mammographic images were processed with CAD.

On physical exam, no suspicious lumps are identified.

Targeted ultrasound is performed, showing a cluster of cysts at 10
o'clock, 3 cm from the nipple accounting for the mammographic
finding. No other suspicious abnormalities in the lateral right
breast.
IMPRESSION: Fibrocystic changes.  No evidence of malignancy.

RECOMMENDATION:
Annual screening mammography.

I have discussed the findings and recommendations with the patient.
Results were also provided in writing at the conclusion of the
visit. If applicable, a reminder letter will be sent to the patient
regarding the next appointment.

BI-RADS CATEGORY  2: Benign.

## 2020-12-09 DIAGNOSIS — M546 Pain in thoracic spine: Secondary | ICD-10-CM | POA: Diagnosis not present

## 2020-12-09 DIAGNOSIS — M542 Cervicalgia: Secondary | ICD-10-CM | POA: Diagnosis not present

## 2020-12-09 DIAGNOSIS — M25511 Pain in right shoulder: Secondary | ICD-10-CM | POA: Diagnosis not present

## 2020-12-09 DIAGNOSIS — M5451 Vertebrogenic low back pain: Secondary | ICD-10-CM | POA: Diagnosis not present

## 2021-01-12 ENCOUNTER — Encounter: Payer: Self-pay | Admitting: Emergency Medicine

## 2021-01-12 ENCOUNTER — Ambulatory Visit
Admission: EM | Admit: 2021-01-12 | Discharge: 2021-01-12 | Disposition: A | Discharge status: Home / Self Care | Payer: BC Managed Care – PPO | Attending: Family Medicine | Admitting: Family Medicine

## 2021-01-12 DIAGNOSIS — N3000 Acute cystitis without hematuria: Secondary | ICD-10-CM | POA: Diagnosis not present

## 2021-01-12 LAB — POCT URINALYSIS DIP (MANUAL ENTRY)
Bilirubin, UA: NEGATIVE
Glucose, UA: 100 mg/dL — AB
Ketones, POC UA: NEGATIVE mg/dL
Nitrite, UA: POSITIVE — AB
Protein Ur, POC: 30 mg/dL — AB
Spec Grav, UA: 1.015 (ref 1.010–1.025)
Urobilinogen, UA: 1 E.U./dL
pH, UA: 7.5 (ref 5.0–8.0)

## 2021-01-12 MED ORDER — FLUCONAZOLE 150 MG PO TABS: 150.0000 mg | ORAL_TABLET | Freq: Once | ORAL | 0 refills | Status: AC

## 2021-01-12 MED ORDER — SULFAMETHOXAZOLE-TRIMETHOPRIM 800-160 MG PO TABS: 1.0000 | ORAL_TABLET | Freq: Two times a day (BID) | ORAL | 0 refills | Status: AC

## 2021-01-12 NOTE — Discharge Instructions (Signed)
Urine culture is pending.  Typically takes 3 to 4 days for the results to be available.  If any additional treatment is warranted following those results someone from our office will contact you.  In the meantime hydrate well with fluids especially water.  May continue the Azo's for comfort as needed.

## 2021-01-12 NOTE — ED Triage Notes (Signed)
Pt c/o dysuria sxs started 4 nights ago but got worse last night

## 2021-01-13 DIAGNOSIS — Z79899 Other long term (current) drug therapy: Secondary | ICD-10-CM | POA: Diagnosis not present

## 2021-01-13 DIAGNOSIS — F902 Attention-deficit hyperactivity disorder, combined type: Secondary | ICD-10-CM | POA: Diagnosis not present

## 2021-01-13 LAB — URINE CULTURE
Culture: <10000 — AB
Special Requests: NORMAL

## 2021-01-18 NOTE — ED Provider Notes (Signed)
[facility]    CSN: [id number] Arrival date & time: 01/12/21  1327      History   Chief Complaint Chief Complaint  Patient presents with   Dysuria    HPI Tanya [name] is a 40 y.o. female.   HPI   Tanya [name] is a 40 y.o. female presents for evaluation of urinary frequency, urgency and dysuria x 4 days, without flank pain, fever, chills, or abnormal vaginal discharge or bleeding. Reports no concern for STD. Previous history of UTI and reports failure with nitrofurantoin antibiotic. Uncertain of prior urine pathology. She has taken AZO which has been helpful for pain.  No LMP recorded. Patient has had an ablation.   Past Medical History:  Diagnosis Date   ADHD (attention deficit hyperactivity disorder)    Anemia    postpartum   Anxiety    COVID-19    GERD (gastroesophageal reflux disease)    History of chicken pox    History of pyelonephritis    as child   IBS (irritable bowel syndrome)    Infection    UTI   Interstitial cystitis    Migraines    Seasonal allergies    Tachycardia    Tilted uterus     Patient Active Problem List   Diagnosis Date Noted   Insect bite of right shoulder 06/19/2018   Localized edema 05/19/2018   Numbness and tingling of left leg 05/19/2018   Insomnia 05/19/2018   BMI 31.0-31.9,adult 01/22/2018   Healthcare maintenance 12/24/2017   GERD (gastroesophageal reflux disease) 12/24/2017   Anemia 12/24/2017   GAD (generalized anxiety disorder) 12/24/2017   Migraine with aura and without status migrainosus, not intractable 02/01/2017   S/P cesarean section 08/07/2015   Placenta previa antepartum 08/03/2015   TACHYCARDIA 06/07/2008   DYSPNEA 06/07/2008    Past Surgical History:  Procedure Laterality Date   CESAREAN SECTION N/A 08/07/2015   Procedure: CESAREAN SECTION;  Surgeon: Marcelle Overlie, MD;  Location: [facility];  Service: Obstetrics;  Laterality: N/A;   CESAREAN SECTION     dilate and curettage  2016    TUBAL LIGATION     Tubiligation      OB History     Gravida  5   Para  3   Term  2   Preterm  1   AB  2   Living  3      SAB  2   IAB      Ectopic      Multiple  0   Live Births  3            Home Medications    Prior to Admission medications   Medication Sig Start Date End Date Taking? Authorizing Provider  ALPRAZolam (XANAX) 0.25 MG tablet Take 0.25 mg by mouth 3 (three) times daily as needed. 01/01/19  Yes [provider]  escitalopram (LEXAPRO) 10 MG tablet Take 10 mg by mouth daily. 12/04/17  Yes [provider]  lisdexamfetamine (VYVANSE) 30 MG capsule Vyvanse 30 mg capsule  TAKE 1 CAPSULE BY MOUTH DAILY   Yes [provider]  ondansetron (ZOFRAN) 8 MG tablet Take 1 tablet (8 mg total) by mouth every 8 (eight) hours as needed for nausea or vomiting. 01/01/19  Yes Danford, Orpha Bur D, NP  rizatriptan (MAXALT-MLT) 10 MG disintegrating tablet Take 1 tablet at the onset of migraine. May repeat in 2 hours if needed 02/03/20  Yes Butch Penny, NP  diazepam (VALIUM) 10 MG  tablet SMARTSIG:1 Tablet(s) Vaginal Twice Daily PRN 01/11/21   [provider]  ibuprofen (ADVIL,MOTRIN) 800 MG tablet Take 1 tablet (800 mg total) by mouth every 8 (eight) hours as needed. 05/20/18   Trula Slade, DPM  meloxicam (MOBIC) 7.5 MG tablet Take 1 to 2 tablets daily as needed for pain related to inflammation. 11/14/19   Scot Jun, FNP  methocarbamol (ROBAXIN) 500 MG tablet Take 1 tablet (500 mg total) by mouth 2 (two) times daily. 11/14/19   Scot Jun, FNP  methylphenidate 27 MG PO CR tablet methylphenidate ER 27 mg tablet,extended release 24 hr  TAKE 1 TABLET BY MOUTH DAILY    [provider]    Family History Family History  Problem Relation Age of Onset   Hypertension Mother    Hypothyroidism Mother    Thyroid disease Mother    Other Mother        benign brain tumor   Depression Mother    Hyperlipidemia Mother     Cancer Maternal Grandfather        breast   Heart attack Maternal Grandfather    Hyperlipidemia Maternal Grandfather    Hypertension Maternal Grandfather    Breast cancer Maternal Grandfather    Lupus Paternal Grandmother    Diabetes Paternal Grandfather     Social History Social History   Tobacco Use   Smoking status: Never   Smokeless tobacco: Never  Vaping Use   Vaping Use: Never used  Substance Use Topics   Alcohol use: Yes    Alcohol/week: 2.0 standard drinks    Types: 2 Glasses of wine per week    Comment: 1-2 per week   Drug use: No     Allergies   Compazine [prochlorperazine edisylate]   Review of Systems Review of Systems Pertinent negatives listed in HPI   Physical Exam Triage Vital Signs ED Triage Vitals  Enc Vitals Group     BP 01/12/21 1335 112/77     Pulse Rate 01/12/21 1335 (!) 103     Resp 01/12/21 1335 18     Temp 01/12/21 1335 98.4 F (36.9 C)     Temp Source 01/12/21 1335 Oral     SpO2 01/12/21 1335 98 %     Weight --      Height --      Head Circumference --      Peak Flow --      Pain Score 01/12/21 1333 0     Pain Loc --      Pain Edu? --      Excl. in [place]? --    No data found.  Updated Vital Signs BP 112/77 (BP Location: Left Arm)   Pulse (!) 103   Temp 98.4 F (36.9 C) (Oral)   Resp 18   SpO2 98%   Visual Acuity Right Eye Distance:   Left Eye Distance:   Bilateral Distance:    Right Eye Near:   Left Eye Near:    Bilateral Near:     Physical Exam General appearance: alert, well developed, well nourished, cooperative  Head: Normocephalic, without obvious abnormality, atraumatic Respiratory: Respirations even and unlabored, normal respiratory rate Heart: rate and rhythm normal. No gallop or murmurs noted on exam  Abdomen: BS +, no distention, no rebound tenderness, or no CVA tenderness  Extremities: No gross deformities Skin: Skin color, texture, turgor normal. No rashes seen  Psych: Appropriate mood and  affect. Neurologic: no acute focal abnormalities present on exam  UC Treatments / Results  Labs (all labs ordered are listed, but only abnormal results are displayed) Labs Reviewed  URINE CULTURE - Abnormal; Notable for the following components:      Result Value   Culture   (*)    Value: <10,000 COLONIES/mL INSIGNIFICANT GROWTH Performed at [facility], [address], [place], [place] [address]    All other components within normal limits  POCT URINALYSIS DIP (MANUAL ENTRY) - Abnormal; Notable for the following components:   Color, UA orange (*)    Glucose, UA =100 (*)    Blood, UA trace-intact (*)    Protein Ur, POC =30 (*)    Nitrite, UA Positive (*)    Leukocytes, UA Small (1+) (*)    All other components within normal limits    EKG   Radiology No results found.  Procedures Procedures (including critical care time)  Medications Ordered in UC Medications - No data to display  Initial Impression / Assessment and Plan / UC Course  I have reviewed the triage vital signs and the nursing notes.  Pertinent labs & imaging results that were available during my care of the patient were reviewed by me and considered in my medical decision making (see chart for details).      UA abnormal and findings consistent with UTI. Empiric antibiotic treatment initiated.  Encouraged increase intake of water. Urine culture pending. ER if symptoms become severe. Follow-up with PCP if symptoms do not completely resolve.  Final Clinical Impressions(s) / UC Diagnoses   Final diagnoses:  Acute cystitis without hematuria     Discharge Instructions      Urine culture is pending.  Typically takes 3 to 4 days for the results to be available.  If any additional treatment is warranted following those results someone from our office will contact you.  In the meantime hydrate well with fluids especially water.  May continue the Azo's for comfort as needed.   ED Prescriptions      Medication Sig Dispense Auth. Provider   sulfamethoxazole-trimethoprim (BACTRIM DS) 800-160 MG tablet Take 1 tablet by mouth 2 (two) times daily for 5 days. 10 tablet Scot Jun, FNP   fluconazole (DIFLUCAN) 150 MG tablet Take 1 tablet (150 mg total) by mouth once for 1 dose. Repeat if needed 2 tablet Scot Jun, FNP      PDMP not reviewed this encounter.   Scot Jun, FNP 01/18/21 1731

## 2021-01-31 ENCOUNTER — Telehealth: Payer: BC Managed Care – PPO | Admitting: Adult Health

## 2021-03-14 DIAGNOSIS — M9903 Segmental and somatic dysfunction of lumbar region: Secondary | ICD-10-CM | POA: Diagnosis not present

## 2021-03-14 DIAGNOSIS — M9902 Segmental and somatic dysfunction of thoracic region: Secondary | ICD-10-CM | POA: Diagnosis not present

## 2021-03-14 DIAGNOSIS — M9906 Segmental and somatic dysfunction of lower extremity: Secondary | ICD-10-CM | POA: Diagnosis not present

## 2021-03-14 DIAGNOSIS — M9905 Segmental and somatic dysfunction of pelvic region: Secondary | ICD-10-CM | POA: Diagnosis not present

## 2021-03-14 DIAGNOSIS — M7541 Impingement syndrome of right shoulder: Secondary | ICD-10-CM | POA: Diagnosis not present

## 2021-03-22 DIAGNOSIS — M9905 Segmental and somatic dysfunction of pelvic region: Secondary | ICD-10-CM | POA: Diagnosis not present

## 2021-03-22 DIAGNOSIS — M7541 Impingement syndrome of right shoulder: Secondary | ICD-10-CM | POA: Diagnosis not present

## 2021-03-22 DIAGNOSIS — M9903 Segmental and somatic dysfunction of lumbar region: Secondary | ICD-10-CM | POA: Diagnosis not present

## 2021-03-22 DIAGNOSIS — M9902 Segmental and somatic dysfunction of thoracic region: Secondary | ICD-10-CM | POA: Diagnosis not present

## 2021-03-22 DIAGNOSIS — M9906 Segmental and somatic dysfunction of lower extremity: Secondary | ICD-10-CM | POA: Diagnosis not present

## 2021-04-17 DIAGNOSIS — M9903 Segmental and somatic dysfunction of lumbar region: Secondary | ICD-10-CM | POA: Diagnosis not present

## 2021-04-17 DIAGNOSIS — M542 Cervicalgia: Secondary | ICD-10-CM | POA: Diagnosis not present

## 2021-04-17 DIAGNOSIS — M9901 Segmental and somatic dysfunction of cervical region: Secondary | ICD-10-CM | POA: Diagnosis not present

## 2021-04-17 DIAGNOSIS — M546 Pain in thoracic spine: Secondary | ICD-10-CM | POA: Diagnosis not present

## 2021-04-17 DIAGNOSIS — Z79899 Other long term (current) drug therapy: Secondary | ICD-10-CM | POA: Diagnosis not present

## 2021-04-17 DIAGNOSIS — M9902 Segmental and somatic dysfunction of thoracic region: Secondary | ICD-10-CM | POA: Diagnosis not present

## 2021-04-17 DIAGNOSIS — F902 Attention-deficit hyperactivity disorder, combined type: Secondary | ICD-10-CM | POA: Diagnosis not present

## 2021-04-17 DIAGNOSIS — M5451 Vertebrogenic low back pain: Secondary | ICD-10-CM | POA: Diagnosis not present

## 2021-04-17 DIAGNOSIS — M9908 Segmental and somatic dysfunction of rib cage: Secondary | ICD-10-CM | POA: Diagnosis not present

## 2021-04-17 DIAGNOSIS — M9906 Segmental and somatic dysfunction of lower extremity: Secondary | ICD-10-CM | POA: Diagnosis not present

## 2021-05-03 ENCOUNTER — Telehealth: Payer: BC Managed Care – PPO | Admitting: Physician Assistant

## 2021-05-03 DIAGNOSIS — B001 Herpesviral vesicular dermatitis: Secondary | ICD-10-CM | POA: Diagnosis not present

## 2021-05-03 MED ORDER — VALACYCLOVIR HCL 1 G PO TABS: 2000.0000 mg | ORAL_TABLET | Freq: Two times a day (BID) | ORAL | 0 refills | Status: AC

## 2021-05-03 NOTE — Progress Notes (Signed)
I have spent 5 minutes in review of e-visit questionnaire, review and updating patient chart, medical decision making and response to patient.   Patrisha Hausmann Cody Allyana Vogan, PA-C    

## 2021-05-03 NOTE — Progress Notes (Signed)

## 2021-05-05 DIAGNOSIS — M546 Pain in thoracic spine: Secondary | ICD-10-CM | POA: Diagnosis not present

## 2021-05-05 DIAGNOSIS — M9908 Segmental and somatic dysfunction of rib cage: Secondary | ICD-10-CM | POA: Diagnosis not present

## 2021-05-05 DIAGNOSIS — M9901 Segmental and somatic dysfunction of cervical region: Secondary | ICD-10-CM | POA: Diagnosis not present

## 2021-05-05 DIAGNOSIS — M9902 Segmental and somatic dysfunction of thoracic region: Secondary | ICD-10-CM | POA: Diagnosis not present

## 2021-05-22 DIAGNOSIS — M9906 Segmental and somatic dysfunction of lower extremity: Secondary | ICD-10-CM | POA: Diagnosis not present

## 2021-05-22 DIAGNOSIS — M9905 Segmental and somatic dysfunction of pelvic region: Secondary | ICD-10-CM | POA: Diagnosis not present

## 2021-05-22 DIAGNOSIS — M546 Pain in thoracic spine: Secondary | ICD-10-CM | POA: Diagnosis not present

## 2021-05-22 DIAGNOSIS — M25561 Pain in right knee: Secondary | ICD-10-CM | POA: Diagnosis not present

## 2021-06-28 DIAGNOSIS — Z6830 Body mass index (BMI) 30.0-30.9, adult: Secondary | ICD-10-CM | POA: Diagnosis not present

## 2021-06-28 DIAGNOSIS — Z01419 Encounter for gynecological examination (general) (routine) without abnormal findings: Secondary | ICD-10-CM | POA: Diagnosis not present

## 2021-06-28 DIAGNOSIS — Z1231 Encounter for screening mammogram for malignant neoplasm of breast: Secondary | ICD-10-CM | POA: Diagnosis not present

## 2021-06-28 LAB — HM MAMMOGRAPHY: HM Mammogram: NORMAL (ref 0–4)

## 2021-07-13 DIAGNOSIS — G709 Myoneural disorder, unspecified: Secondary | ICD-10-CM

## 2021-07-13 HISTORY — DX: Myoneural disorder, unspecified: G70.9

## 2021-07-14 DIAGNOSIS — G5601 Carpal tunnel syndrome, right upper limb: Secondary | ICD-10-CM | POA: Diagnosis not present

## 2021-07-17 DIAGNOSIS — F902 Attention-deficit hyperactivity disorder, combined type: Secondary | ICD-10-CM | POA: Diagnosis not present

## 2021-07-17 DIAGNOSIS — Z79899 Other long term (current) drug therapy: Secondary | ICD-10-CM | POA: Diagnosis not present

## 2021-07-26 DIAGNOSIS — G5601 Carpal tunnel syndrome, right upper limb: Secondary | ICD-10-CM | POA: Diagnosis not present

## 2021-07-28 DIAGNOSIS — G5601 Carpal tunnel syndrome, right upper limb: Secondary | ICD-10-CM | POA: Diagnosis not present

## 2021-08-02 DIAGNOSIS — M9902 Segmental and somatic dysfunction of thoracic region: Secondary | ICD-10-CM | POA: Diagnosis not present

## 2021-08-02 DIAGNOSIS — M25531 Pain in right wrist: Secondary | ICD-10-CM | POA: Diagnosis not present

## 2021-08-02 DIAGNOSIS — M542 Cervicalgia: Secondary | ICD-10-CM | POA: Diagnosis not present

## 2021-08-02 DIAGNOSIS — M9901 Segmental and somatic dysfunction of cervical region: Secondary | ICD-10-CM | POA: Diagnosis not present

## 2021-08-23 ENCOUNTER — Encounter: Payer: Self-pay | Admitting: Family Medicine

## 2021-08-23 ENCOUNTER — Ambulatory Visit: Payer: BC Managed Care – PPO | Admitting: Family Medicine

## 2021-08-23 VITALS — BP 124/78 | HR 98 | Temp 97.5°F | Ht 66.5 in | Wt 197.6 lb

## 2021-08-23 DIAGNOSIS — Z7689 Persons encountering health services in other specified circumstances: Secondary | ICD-10-CM | POA: Diagnosis not present

## 2021-08-23 DIAGNOSIS — G5601 Carpal tunnel syndrome, right upper limb: Secondary | ICD-10-CM

## 2021-08-23 DIAGNOSIS — F900 Attention-deficit hyperactivity disorder, predominantly inattentive type: Secondary | ICD-10-CM

## 2021-08-23 DIAGNOSIS — F410 Panic disorder [episodic paroxysmal anxiety] without agoraphobia: Secondary | ICD-10-CM

## 2021-08-23 DIAGNOSIS — R195 Other fecal abnormalities: Secondary | ICD-10-CM

## 2021-08-23 DIAGNOSIS — F419 Anxiety disorder, unspecified: Secondary | ICD-10-CM | POA: Insufficient documentation

## 2021-08-23 DIAGNOSIS — F411 Generalized anxiety disorder: Secondary | ICD-10-CM

## 2021-08-23 DIAGNOSIS — M62838 Other muscle spasm: Secondary | ICD-10-CM

## 2021-08-23 DIAGNOSIS — N9489 Other specified conditions associated with female genital organs and menstrual cycle: Secondary | ICD-10-CM

## 2021-08-23 DIAGNOSIS — G43829 Menstrual migraine, not intractable, without status migrainosus: Secondary | ICD-10-CM | POA: Insufficient documentation

## 2021-08-23 DIAGNOSIS — K582 Mixed irritable bowel syndrome: Secondary | ICD-10-CM

## 2021-08-23 DIAGNOSIS — N3289 Other specified disorders of bladder: Secondary | ICD-10-CM | POA: Insufficient documentation

## 2021-08-23 DIAGNOSIS — N390 Urinary tract infection, site not specified: Secondary | ICD-10-CM

## 2021-08-23 NOTE — Assessment & Plan Note (Signed)
Follows with urology No signs of active infection at this moment

## 2021-08-23 NOTE — Assessment & Plan Note (Signed)
Controlled with as needed meloxicam Follows with orthopedics Awaiting nerve study

## 2021-08-23 NOTE — Assessment & Plan Note (Signed)
Well-controlled on Maxalt 10 mg. has not had migraine in several months.

## 2021-08-23 NOTE — Assessment & Plan Note (Signed)
Follows with orthopedics and sports medicine Controlled on gabapentin 600 mg nightly

## 2021-08-23 NOTE — Assessment & Plan Note (Signed)
Well-controlled on Vyvanse 40 mg daily Follows with psychiatry

## 2021-08-23 NOTE — Assessment & Plan Note (Signed)
Well-controlled on escitalopram 10 mg and as needed Xanax 0.25 mg 3 times daily.

## 2021-08-23 NOTE — Assessment & Plan Note (Signed)
Follows with urology and OB/GYN Uses as needed diazepam 10 mg to control symptoms

## 2021-08-23 NOTE — Progress Notes (Signed)
Tanya [name] is a 41 y.o. female who presents today for an office visit.  Assessment/Plan:  Spent 47 minutes reviewing patient's chart and discussing patient's history from problems outlined as below Problem List Items Addressed This Visit       Cardiovascular and Mediastinum   Menstrual migraine without status migrainosus, not intractable    Well-controlled on Maxalt 10 mg. has not had migraine in several months.       Relevant Medications   gabapentin (NEURONTIN) 600 MG tablet     Digestive   Irritable bowel syndrome with both constipation and diarrhea    Currently having diarrhea No red flag symptoms We will have patient trial probiotics and low FODMAP diet, patient to keep diary Patient is having routine lab work from her OB/GYN to check electrolytes and blood counts Patient to follow-up in 1 month and send labs here If no improvement on low FODMAP diet and probiotics, can consider work-up for diarrhea including infectious work-up and celiac's versus referral to GI        Nervous and Auditory   Carpal tunnel syndrome of right wrist    Controlled with as needed meloxicam Follows with orthopedics Awaiting nerve study      Relevant Medications   gabapentin (NEURONTIN) 600 MG tablet     Musculoskeletal and Integument   Neck muscle spasm    Follows with orthopedics and sports medicine Controlled on gabapentin 600 mg nightly      High-tone pelvic floor dysfunction    Follows with urology and OB/GYN Uses as needed diazepam 10 mg to control symptoms        Genitourinary   Recurrent UTI    Follows with urology No signs of active infection at this moment        Other   Generalized anxiety disorder with panic attacks    Well-controlled on escitalopram 10 mg and as needed Xanax 0.25 mg 3 times daily.      Loose stools - Primary    See diarrhea work-up for IBS as above      Spasm of bladder   Attention deficit hyperactivity disorder, predominantly  inattentive type    Well-controlled on Vyvanse 40 mg daily Follows with psychiatry      Other Visit Diagnoses     Establishing care with new doctor, encounter for              Subjective:  HPI:  [name] is a 41 y.o. female who has Generalized anxiety disorder with panic attacks; BMI 31.0-31.9,adult; Loose stools; Carpal tunnel syndrome of right wrist; Menstrual migraine without status migrainosus, not intractable; Recurrent UTI; Spasm of bladder; Neck muscle spasm; Attention deficit hyperactivity disorder, predominantly inattentive type; High-tone pelvic floor dysfunction; and Irritable bowel syndrome with both constipation and diarrhea on their problem list..   She  has a past medical history of ADHD (attention deficit hyperactivity disorder), Anemia, Anxiety, COVID-19, DYSPNEA (06/07/2008), GERD (gastroesophageal reflux disease), Healthcare maintenance (12/24/2017), History of chicken pox, History of pyelonephritis, IBS (irritable bowel syndrome), Infection, Insomnia (05/19/2018), Interstitial cystitis, Localized edema (05/19/2018), Migraine with aura and without status migrainosus, not intractable (02/01/2017), Migraines, Neuromuscular disorder (HCC) (07/13/2021), Numbness and tingling of left leg (05/19/2018), Placenta previa antepartum (08/03/2015), S/P cesarean section (08/07/2015), Seasonal allergies, Tachycardia, TACHYCARDIA (06/07/2008), and Tilted uterus..   She presents with chief complaint of Establish Care (Patient states that she has been having loose stools x a few weeks. Denies abdominal cramping just gas and burping.) .  Diarrhea: Patient complains of diarrhea. Symptoms have been present for approximately 4 weeks. The symptoms are unchanged. Stool frequency is approximately 3 per day. Diarrhea does not occur at night. The also patient reports the following symptoms: cramping unrelieved by defecation, fecal urgency, loose stools, and melena. The patient denies the following  symptoms: abdominal distension, bloating, borborygmi, fecal incontinence, fever, flatulence, floating stools, greasy stool, increase in stool volume, and pus with stools. The patient currently denies significant abdominal pain or discomfort.   Relationship to food: the diarrhea does not improve with fasting, the diarrhea is made worse by foods containing: high volume of fat. Relationship to medications: the patient denies any relationship to medications. Other risk factors: H/o IBS. Therapy tried so far: none. Work up so far: none.  Patient has history of menstrual related migraines.  She has had an ablation 2020, migraines are controlled with Maxalt.  Patient says she has not had a migraine in a while.  Patient has a history of anxiety with panic attacks.  She does take escitalopram and as needed Xanax.  She reports that she has not had a panic attack in a long time and anxiety is well controlled.  Patient has history of recurrent UTIs and bladder spasm.  She follows with urology and OB/GYN.  Bladder spasms are controlled with diazepam 10 mg..  Patient does not for any urinary symptoms at this moment.  Patient reports history of IBS mostly with constipation.  This was diagnosed in her 16s when she was in college.  She did have work-up with GI including barium swallow.  Patient has history of right carpal tunnel.  She is currently being worked up by of sports medicine and [facility] orthopedics.  She is waiting for her nerve conduction study.  Pain is controlled with splinting and meloxicam.  Patient has history of ADHD.  Does follow with psychiatry.  This is controlled on methylphenidate 40 mg daily.  Patient has a history of chronic neck spasms.  This follows sports medicine and orthopedics.  Pain is well controlled with gabapentin 600 mg.   Objective:  Physical Exam: BP 124/78 (BP Location: Left Arm, Patient Position: Sitting, Cuff Size: Large)   Pulse 98   Temp (!) 97.5 F (36.4 C)  (Temporal)   Ht 5' 6.5" (1.689 m)   Wt 197 lb 9.6 oz (89.6 kg)   SpO2 98%   BMI 31.42 kg/m    General: No acute distress. Awake and conversant.  Eyes: Normal conjunctiva, anicteric. Round symmetric pupils.  ENT: Hearing grossly intact. No nasal discharge.  Neck: Neck is supple. No masses or thyromegaly.  Respiratory: Respirations are non-labored. No auditory wheezing.  Skin: Warm. No rashes or ulcers.  Psych: Alert and oriented. Cooperative, Appropriate mood and affect, Normal judgment.  CV: No cyanosis or JVD MSK: Normal ambulation. No clubbing  Neuro: Sensation and CN II-XII grossly normal.        Garner Nash, MD, MS

## 2021-08-23 NOTE — Patient Instructions (Addendum)
Keep a diarrhea and food diary  Have labs from OB/GYN sent to our office

## 2021-08-23 NOTE — Assessment & Plan Note (Signed)
Currently having diarrhea No red flag symptoms We will have patient trial probiotics and low FODMAP diet, patient to keep diary Patient is having routine lab work from her OB/GYN to check electrolytes and blood counts Patient to follow-up in 1 month and send labs here If no improvement on low FODMAP diet and probiotics, can consider work-up for diarrhea including infectious work-up and celiac's versus referral to GI

## 2021-08-23 NOTE — Assessment & Plan Note (Signed)
See diarrhea work-up for IBS as above

## 2021-08-24 DIAGNOSIS — Z13228 Encounter for screening for other metabolic disorders: Secondary | ICD-10-CM | POA: Diagnosis not present

## 2021-08-24 DIAGNOSIS — G5623 Lesion of ulnar nerve, bilateral upper limbs: Secondary | ICD-10-CM | POA: Diagnosis not present

## 2021-08-24 DIAGNOSIS — Z131 Encounter for screening for diabetes mellitus: Secondary | ICD-10-CM | POA: Diagnosis not present

## 2021-08-24 DIAGNOSIS — Z1329 Encounter for screening for other suspected endocrine disorder: Secondary | ICD-10-CM | POA: Diagnosis not present

## 2021-08-24 DIAGNOSIS — G5601 Carpal tunnel syndrome, right upper limb: Secondary | ICD-10-CM | POA: Diagnosis not present

## 2021-08-24 DIAGNOSIS — Z1321 Encounter for screening for nutritional disorder: Secondary | ICD-10-CM | POA: Diagnosis not present

## 2021-09-01 DIAGNOSIS — M9908 Segmental and somatic dysfunction of rib cage: Secondary | ICD-10-CM | POA: Diagnosis not present

## 2021-09-01 DIAGNOSIS — M9901 Segmental and somatic dysfunction of cervical region: Secondary | ICD-10-CM | POA: Diagnosis not present

## 2021-09-01 DIAGNOSIS — M542 Cervicalgia: Secondary | ICD-10-CM | POA: Diagnosis not present

## 2021-09-01 DIAGNOSIS — M9902 Segmental and somatic dysfunction of thoracic region: Secondary | ICD-10-CM | POA: Diagnosis not present

## 2021-09-20 ENCOUNTER — Ambulatory Visit: Payer: BC Managed Care – PPO | Admitting: Family Medicine

## 2021-09-22 DIAGNOSIS — A6920 Lyme disease, unspecified: Secondary | ICD-10-CM | POA: Diagnosis not present

## 2021-09-22 DIAGNOSIS — M064 Inflammatory polyarthropathy: Secondary | ICD-10-CM | POA: Diagnosis not present

## 2021-09-22 DIAGNOSIS — M542 Cervicalgia: Secondary | ICD-10-CM | POA: Diagnosis not present

## 2021-10-04 ENCOUNTER — Telehealth: Payer: Self-pay | Admitting: Family Medicine

## 2021-10-04 ENCOUNTER — Ambulatory Visit: Payer: BC Managed Care – PPO | Admitting: Family Medicine

## 2021-10-04 NOTE — Telephone Encounter (Signed)
Pt called 8:18a and not coming in.  1st late cancel/no show, letter sent, fee waived

## 2021-10-04 NOTE — Telephone Encounter (Signed)
8.23.23 no show letter sent 

## 2021-10-12 ENCOUNTER — Encounter: Payer: Self-pay | Admitting: Family Medicine

## 2021-10-12 DIAGNOSIS — G43829 Menstrual migraine, not intractable, without status migrainosus: Secondary | ICD-10-CM

## 2021-10-12 MED ORDER — RIZATRIPTAN BENZOATE 10 MG PO TBDP: ORAL_TABLET | ORAL | 11 refills | Status: DC

## 2021-10-18 DIAGNOSIS — F902 Attention-deficit hyperactivity disorder, combined type: Secondary | ICD-10-CM | POA: Diagnosis not present

## 2021-10-18 DIAGNOSIS — Z79899 Other long term (current) drug therapy: Secondary | ICD-10-CM | POA: Diagnosis not present

## 2021-10-19 DIAGNOSIS — Z79899 Other long term (current) drug therapy: Secondary | ICD-10-CM | POA: Diagnosis not present

## 2021-10-19 DIAGNOSIS — F902 Attention-deficit hyperactivity disorder, combined type: Secondary | ICD-10-CM | POA: Diagnosis not present

## 2021-10-25 DIAGNOSIS — M9902 Segmental and somatic dysfunction of thoracic region: Secondary | ICD-10-CM | POA: Diagnosis not present

## 2021-10-25 DIAGNOSIS — M9901 Segmental and somatic dysfunction of cervical region: Secondary | ICD-10-CM | POA: Diagnosis not present

## 2021-10-25 DIAGNOSIS — M546 Pain in thoracic spine: Secondary | ICD-10-CM | POA: Diagnosis not present

## 2021-10-25 DIAGNOSIS — M542 Cervicalgia: Secondary | ICD-10-CM | POA: Diagnosis not present

## 2021-11-24 DIAGNOSIS — L409 Psoriasis, unspecified: Secondary | ICD-10-CM | POA: Diagnosis not present

## 2021-11-24 DIAGNOSIS — M659 Synovitis and tenosynovitis, unspecified: Secondary | ICD-10-CM | POA: Diagnosis not present

## 2021-12-15 DIAGNOSIS — M9901 Segmental and somatic dysfunction of cervical region: Secondary | ICD-10-CM | POA: Diagnosis not present

## 2021-12-15 DIAGNOSIS — M542 Cervicalgia: Secondary | ICD-10-CM | POA: Diagnosis not present

## 2021-12-15 DIAGNOSIS — M25512 Pain in left shoulder: Secondary | ICD-10-CM | POA: Diagnosis not present

## 2021-12-15 DIAGNOSIS — M9907 Segmental and somatic dysfunction of upper extremity: Secondary | ICD-10-CM | POA: Diagnosis not present

## 2021-12-20 DIAGNOSIS — M9902 Segmental and somatic dysfunction of thoracic region: Secondary | ICD-10-CM | POA: Diagnosis not present

## 2021-12-20 DIAGNOSIS — M9903 Segmental and somatic dysfunction of lumbar region: Secondary | ICD-10-CM | POA: Diagnosis not present

## 2021-12-20 DIAGNOSIS — M9905 Segmental and somatic dysfunction of pelvic region: Secondary | ICD-10-CM | POA: Diagnosis not present

## 2021-12-20 DIAGNOSIS — M9906 Segmental and somatic dysfunction of lower extremity: Secondary | ICD-10-CM | POA: Diagnosis not present

## 2021-12-21 ENCOUNTER — Telehealth: Payer: BC Managed Care – PPO | Admitting: Physician Assistant

## 2021-12-21 DIAGNOSIS — R3989 Other symptoms and signs involving the genitourinary system: Secondary | ICD-10-CM | POA: Diagnosis not present

## 2021-12-21 MED ORDER — SULFAMETHOXAZOLE-TRIMETHOPRIM 800-160 MG PO TABS: 1.0000 | ORAL_TABLET | Freq: Two times a day (BID) | ORAL | 0 refills | Status: DC

## 2021-12-21 NOTE — Progress Notes (Signed)

## 2021-12-21 NOTE — Progress Notes (Signed)
I have spent 5 minutes in review of e-visit questionnaire, review and updating patient chart, medical decision making and response to patient.   Latishia Suitt Cody Xavion Muscat, PA-C    

## 2022-01-08 DIAGNOSIS — N951 Menopausal and female climacteric states: Secondary | ICD-10-CM | POA: Diagnosis not present

## 2022-01-12 DIAGNOSIS — M9901 Segmental and somatic dysfunction of cervical region: Secondary | ICD-10-CM | POA: Diagnosis not present

## 2022-01-12 DIAGNOSIS — M9902 Segmental and somatic dysfunction of thoracic region: Secondary | ICD-10-CM | POA: Diagnosis not present

## 2022-01-12 DIAGNOSIS — M542 Cervicalgia: Secondary | ICD-10-CM | POA: Diagnosis not present

## 2022-01-12 DIAGNOSIS — M546 Pain in thoracic spine: Secondary | ICD-10-CM | POA: Diagnosis not present

## 2022-01-17 DIAGNOSIS — Z79899 Other long term (current) drug therapy: Secondary | ICD-10-CM | POA: Diagnosis not present

## 2022-01-17 DIAGNOSIS — F902 Attention-deficit hyperactivity disorder, combined type: Secondary | ICD-10-CM | POA: Diagnosis not present

## 2022-01-29 DIAGNOSIS — M9906 Segmental and somatic dysfunction of lower extremity: Secondary | ICD-10-CM | POA: Diagnosis not present

## 2022-01-29 DIAGNOSIS — M25552 Pain in left hip: Secondary | ICD-10-CM | POA: Diagnosis not present

## 2022-01-29 DIAGNOSIS — M25562 Pain in left knee: Secondary | ICD-10-CM | POA: Diagnosis not present

## 2022-01-29 DIAGNOSIS — M9902 Segmental and somatic dysfunction of thoracic region: Secondary | ICD-10-CM | POA: Diagnosis not present

## 2022-02-08 DIAGNOSIS — M545 Low back pain, unspecified: Secondary | ICD-10-CM | POA: Diagnosis not present

## 2022-02-08 DIAGNOSIS — S39012A Strain of muscle, fascia and tendon of lower back, initial encounter: Secondary | ICD-10-CM | POA: Diagnosis not present

## 2022-02-09 DIAGNOSIS — M25551 Pain in right hip: Secondary | ICD-10-CM | POA: Diagnosis not present

## 2022-02-09 DIAGNOSIS — M5451 Vertebrogenic low back pain: Secondary | ICD-10-CM | POA: Diagnosis not present

## 2022-02-09 DIAGNOSIS — M4607 Spinal enthesopathy, lumbosacral region: Secondary | ICD-10-CM | POA: Diagnosis not present

## 2022-02-09 DIAGNOSIS — M4608 Spinal enthesopathy, sacral and sacrococcygeal region: Secondary | ICD-10-CM | POA: Diagnosis not present

## 2022-02-19 DIAGNOSIS — M9906 Segmental and somatic dysfunction of lower extremity: Secondary | ICD-10-CM | POA: Diagnosis not present

## 2022-02-19 DIAGNOSIS — M9902 Segmental and somatic dysfunction of thoracic region: Secondary | ICD-10-CM | POA: Diagnosis not present

## 2022-02-19 DIAGNOSIS — M9903 Segmental and somatic dysfunction of lumbar region: Secondary | ICD-10-CM | POA: Diagnosis not present

## 2022-02-19 DIAGNOSIS — M9907 Segmental and somatic dysfunction of upper extremity: Secondary | ICD-10-CM | POA: Diagnosis not present

## 2022-02-19 DIAGNOSIS — M9905 Segmental and somatic dysfunction of pelvic region: Secondary | ICD-10-CM | POA: Diagnosis not present

## 2022-02-21 DIAGNOSIS — M5451 Vertebrogenic low back pain: Secondary | ICD-10-CM | POA: Diagnosis not present

## 2022-02-21 DIAGNOSIS — M4607 Spinal enthesopathy, lumbosacral region: Secondary | ICD-10-CM | POA: Diagnosis not present

## 2022-02-21 DIAGNOSIS — M064 Inflammatory polyarthropathy: Secondary | ICD-10-CM | POA: Diagnosis not present

## 2022-02-21 DIAGNOSIS — M5442 Lumbago with sciatica, left side: Secondary | ICD-10-CM | POA: Diagnosis not present

## 2022-02-28 ENCOUNTER — Other Ambulatory Visit: Payer: Self-pay | Admitting: Sports Medicine

## 2022-02-28 DIAGNOSIS — M5442 Lumbago with sciatica, left side: Secondary | ICD-10-CM

## 2022-03-06 DIAGNOSIS — M5116 Intervertebral disc disorders with radiculopathy, lumbar region: Secondary | ICD-10-CM | POA: Diagnosis not present

## 2022-03-09 DIAGNOSIS — M545 Low back pain, unspecified: Secondary | ICD-10-CM | POA: Diagnosis not present

## 2022-03-09 DIAGNOSIS — L409 Psoriasis, unspecified: Secondary | ICD-10-CM | POA: Diagnosis not present

## 2022-03-09 DIAGNOSIS — G8929 Other chronic pain: Secondary | ICD-10-CM | POA: Diagnosis not present

## 2022-03-10 ENCOUNTER — Other Ambulatory Visit: Payer: BC Managed Care – PPO

## 2022-03-12 DIAGNOSIS — M5441 Lumbago with sciatica, right side: Secondary | ICD-10-CM | POA: Diagnosis not present

## 2022-04-04 DIAGNOSIS — M62838 Other muscle spasm: Secondary | ICD-10-CM | POA: Diagnosis not present

## 2022-04-04 DIAGNOSIS — M533 Sacrococcygeal disorders, not elsewhere classified: Secondary | ICD-10-CM | POA: Diagnosis not present

## 2022-04-04 DIAGNOSIS — N816 Rectocele: Secondary | ICD-10-CM | POA: Diagnosis not present

## 2022-04-04 DIAGNOSIS — M25552 Pain in left hip: Secondary | ICD-10-CM | POA: Diagnosis not present

## 2022-04-16 DIAGNOSIS — M62838 Other muscle spasm: Secondary | ICD-10-CM | POA: Diagnosis not present

## 2022-04-16 DIAGNOSIS — Z5181 Encounter for therapeutic drug level monitoring: Secondary | ICD-10-CM | POA: Diagnosis not present

## 2022-04-16 DIAGNOSIS — M533 Sacrococcygeal disorders, not elsewhere classified: Secondary | ICD-10-CM | POA: Diagnosis not present

## 2022-04-16 DIAGNOSIS — N816 Rectocele: Secondary | ICD-10-CM | POA: Diagnosis not present

## 2022-04-16 DIAGNOSIS — Z79899 Other long term (current) drug therapy: Secondary | ICD-10-CM | POA: Diagnosis not present

## 2022-04-16 DIAGNOSIS — M25552 Pain in left hip: Secondary | ICD-10-CM | POA: Diagnosis not present

## 2022-04-16 DIAGNOSIS — Z79891 Long term (current) use of opiate analgesic: Secondary | ICD-10-CM | POA: Diagnosis not present

## 2022-04-16 DIAGNOSIS — F902 Attention-deficit hyperactivity disorder, combined type: Secondary | ICD-10-CM | POA: Diagnosis not present

## 2022-04-26 DIAGNOSIS — N816 Rectocele: Secondary | ICD-10-CM | POA: Diagnosis not present

## 2022-04-26 DIAGNOSIS — M25552 Pain in left hip: Secondary | ICD-10-CM | POA: Diagnosis not present

## 2022-04-26 DIAGNOSIS — M62838 Other muscle spasm: Secondary | ICD-10-CM | POA: Diagnosis not present

## 2022-04-26 DIAGNOSIS — M533 Sacrococcygeal disorders, not elsewhere classified: Secondary | ICD-10-CM | POA: Diagnosis not present

## 2022-04-30 DIAGNOSIS — M9907 Segmental and somatic dysfunction of upper extremity: Secondary | ICD-10-CM | POA: Diagnosis not present

## 2022-04-30 DIAGNOSIS — M25552 Pain in left hip: Secondary | ICD-10-CM | POA: Diagnosis not present

## 2022-04-30 DIAGNOSIS — M25551 Pain in right hip: Secondary | ICD-10-CM | POA: Diagnosis not present

## 2022-04-30 DIAGNOSIS — M9903 Segmental and somatic dysfunction of lumbar region: Secondary | ICD-10-CM | POA: Diagnosis not present

## 2022-04-30 DIAGNOSIS — M5451 Vertebrogenic low back pain: Secondary | ICD-10-CM | POA: Diagnosis not present

## 2022-04-30 DIAGNOSIS — M9904 Segmental and somatic dysfunction of sacral region: Secondary | ICD-10-CM | POA: Diagnosis not present

## 2022-05-03 DIAGNOSIS — M533 Sacrococcygeal disorders, not elsewhere classified: Secondary | ICD-10-CM | POA: Diagnosis not present

## 2022-05-03 DIAGNOSIS — M25552 Pain in left hip: Secondary | ICD-10-CM | POA: Diagnosis not present

## 2022-05-03 DIAGNOSIS — M62838 Other muscle spasm: Secondary | ICD-10-CM | POA: Diagnosis not present

## 2022-05-03 DIAGNOSIS — N816 Rectocele: Secondary | ICD-10-CM | POA: Diagnosis not present

## 2022-05-15 DIAGNOSIS — M62838 Other muscle spasm: Secondary | ICD-10-CM | POA: Diagnosis not present

## 2022-05-15 DIAGNOSIS — M25552 Pain in left hip: Secondary | ICD-10-CM | POA: Diagnosis not present

## 2022-05-15 DIAGNOSIS — N816 Rectocele: Secondary | ICD-10-CM | POA: Diagnosis not present

## 2022-05-15 DIAGNOSIS — M533 Sacrococcygeal disorders, not elsewhere classified: Secondary | ICD-10-CM | POA: Diagnosis not present

## 2022-05-16 ENCOUNTER — Ambulatory Visit
Admission: EM | Admit: 2022-05-16 | Discharge: 2022-05-16 | Disposition: A | Discharge status: Home / Self Care | Payer: BC Managed Care – PPO | Attending: Family Medicine | Admitting: Family Medicine

## 2022-05-16 ENCOUNTER — Ambulatory Visit (INDEPENDENT_AMBULATORY_CARE_PROVIDER_SITE_OTHER): Payer: BC Managed Care – PPO

## 2022-05-16 DIAGNOSIS — N200 Calculus of kidney: Secondary | ICD-10-CM | POA: Diagnosis not present

## 2022-05-16 DIAGNOSIS — R109 Unspecified abdominal pain: Secondary | ICD-10-CM

## 2022-05-16 DIAGNOSIS — R1031 Right lower quadrant pain: Secondary | ICD-10-CM | POA: Diagnosis not present

## 2022-05-16 LAB — CBC WITH DIFFERENTIAL/PLATELET
Abs Immature Granulocytes: 0.01 10*3/uL (ref 0.00–0.07)
Basophils Absolute: 0 10*3/uL (ref 0.0–0.1)
Basophils Relative: 1 %
Eosinophils Absolute: 0.1 10*3/uL (ref 0.0–0.5)
Eosinophils Relative: 2 %
HCT: 43.9 % (ref 36.0–46.0)
Hemoglobin: 14.9 g/dL (ref 12.0–15.0)
Immature Granulocytes: 0 %
Lymphocytes Relative: 29 %
Lymphs Abs: 1.8 10*3/uL (ref 0.7–4.0)
MCH: 29.8 pg (ref 26.0–34.0)
MCHC: 33.9 g/dL (ref 30.0–36.0)
MCV: 87.8 fL (ref 80.0–100.0)
Monocytes Absolute: 0.5 10*3/uL (ref 0.1–1.0)
Monocytes Relative: 8 %
Neutro Abs: 3.7 10*3/uL (ref 1.7–7.7)
Neutrophils Relative %: 60 %
Platelets: 257 10*3/uL (ref 150–400)
RBC: 5 MIL/uL (ref 3.87–5.11)
RDW: 12.4 % (ref 11.5–15.5)
WBC: 6.2 10*3/uL (ref 4.0–10.5)
nRBC: 0 % (ref 0.0–0.2)

## 2022-05-16 LAB — COMPREHENSIVE METABOLIC PANEL
ALT: 16 U/L (ref 0–44)
AST: 21 U/L (ref 15–41)
Albumin: 4.4 g/dL (ref 3.5–5.0)
Alkaline Phosphatase: 62 U/L (ref 38–126)
Anion gap: 7 (ref 5–15)
BUN: 18 mg/dL (ref 6–20)
CO2: 26 mmol/L (ref 22–32)
Calcium: 8.7 mg/dL — ABNORMAL LOW (ref 8.9–10.3)
Chloride: 101 mmol/L (ref 98–111)
Creatinine, Ser: 0.86 mg/dL (ref 0.44–1.00)
GFR, Estimated: >60 mL/min (ref >60)
Glucose, Bld: 90 mg/dL (ref 70–99)
Potassium: 4.2 mmol/L (ref 3.5–5.1)
Sodium: 134 mmol/L — ABNORMAL LOW (ref 135–145)
Total Bilirubin: 0.2 mg/dL — ABNORMAL LOW (ref 0.3–1.2)
Total Protein: 7.7 g/dL (ref 6.5–8.1)

## 2022-05-16 LAB — URINALYSIS, W/ REFLEX TO CULTURE (INFECTION SUSPECTED)
Bilirubin Urine: NEGATIVE
Glucose, UA: NEGATIVE mg/dL
Hgb urine dipstick: NEGATIVE
Ketones, ur: NEGATIVE mg/dL
Leukocytes,Ua: NEGATIVE
Nitrite: NEGATIVE
Protein, ur: NEGATIVE mg/dL
Specific Gravity, Urine: 1.02 (ref 1.005–1.030)
pH: 7 (ref 5.0–8.0)

## 2022-05-16 LAB — LIPASE, BLOOD: Lipase: 42 U/L (ref 11–51)

## 2022-05-16 MED ORDER — METHOCARBAMOL 500 MG PO TABS: 500.0000 mg | ORAL_TABLET | Freq: Two times a day (BID) | ORAL | 0 refills | Status: DC

## 2022-05-16 MED ORDER — IOHEXOL 300 MG/ML  SOLN
100.0000 mL | Freq: Once | INTRAMUSCULAR | Status: AC | PRN
  Administered 2022-05-16: 100 mL via INTRAVENOUS

## 2022-05-16 NOTE — ED Notes (Signed)
CT authorization # [id number] valid 05/16/22-06/14/22

## 2022-05-16 NOTE — ED Triage Notes (Signed)
Pt c/o upper right abdominal pain x2days  Pt states that the pain started when leaving work at 4:30pm and is on the lower right side of abdomen and groin.  Pt has been taking tylenol for pain  Pt has had 2 bowel movements and denies any diarrhea.  Pt has had an Ablation.   Pt denies vaginal discharge or drainage and denies any urinary issues  Pt had a Pelvic PT done yesterday.

## 2022-05-16 NOTE — ED Provider Notes (Signed)
[facility]    CSN: [id number] Arrival date & time: 05/16/22  0845      History   Chief Complaint Chief Complaint  Patient presents with   Abdominal Pain    HPI Tanya [name] is a 42 y.o. female.   HPI  Tanya [name] presents for RLQ abdominal pain that started yesterday while driving home. She had a bowel movement and felt better an hour later. She feels bloated and nauseous. This morning, the pain came back after a bowel movement. Pain worse with the pressure of her pants. She attempted to eat but didn't have an appetite. No strain with bowel movements. Had some constipation. Has IBS.  She went 8 months for diarrhea but this stopped when stopped taking Gabapentin. Took Ibuprofen and tylenol which helped   Past Surgeries: C, section and tubal ligation (2017) and uterine ablation (2020)  Symptoms Nausea/Vomiting: yes, nausea  Diarrhea: no  Constipation: yes  Melena/BRBPR: no  Hematemesis: no  Anorexia: yes  Fever/Chills: no  Dysuria: no  Rash: no  Wt loss: no  EtOH use: denies NSAIDs/ASA: yes  LMP: ablation STD risk/hx: no  Sore throat: no   Cough: no Nasal congestion : no  Sleep disturbance: no Back Pain: no Headache: no   Past Medical History:  Diagnosis Date   ADHD (attention deficit hyperactivity disorder)    Anemia    postpartum   Anxiety    COVID-19    DYSPNEA 06/07/2008   Qualifier: Diagnosis of  By: Lovette Cliche, CNA, Christy     GERD (gastroesophageal reflux disease)    Healthcare maintenance 12/24/2017   History of chicken pox    History of pyelonephritis    as child   IBS (irritable bowel syndrome)    Infection    UTI   Insomnia 05/19/2018   Interstitial cystitis    Localized edema 05/19/2018   Migraine with aura and without status migrainosus, not intractable 02/01/2017   Migraines    Neuromuscular disorder 07/13/2021   Carpal tunnel   Numbness and tingling of left leg 05/19/2018   Placenta previa antepartum 08/03/2015   S/P cesarean section  08/07/2015   Seasonal allergies    Tachycardia    TACHYCARDIA 06/07/2008   Qualifier: Diagnosis of  By: Lovette Cliche, CNA, Christy     Tilted uterus     Patient Active Problem List   Diagnosis Date Noted   Loose stools 08/23/2021   Carpal tunnel syndrome of right wrist 08/23/2021   Menstrual migraine without status migrainosus, not intractable 08/23/2021   Recurrent UTI 08/23/2021   Spasm of bladder 08/23/2021   Neck muscle spasm 08/23/2021   Irritable bowel syndrome with both constipation and diarrhea 08/23/2021   BMI 31.0-31.9,adult 01/22/2018   Generalized anxiety disorder with panic attacks 12/24/2017   Attention deficit hyperactivity disorder, 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 Authorizing Provider  ALPRAZolam (XANAX) 0.25 MG tablet Take 0.25 mg by mouth 3 (three) times daily as needed. 01/01/19  Yes [provider]  escitalopram (LEXAPRO) 10 MG tablet Take 10 mg by mouth daily. 12/04/17  Yes [provider]  gabapentin (NEURONTIN) 600 MG tablet Take 600 mg by mouth once.   Yes [provider]  meloxicam (MOBIC) 7.5 MG tablet Take 1 to 2 tablets daily as needed for pain related to inflammation. 11/14/19  Yes Scot Jun, NP  methocarbamol (ROBAXIN) 500 MG tablet Take 1 tablet (500 mg total) by mouth 2 (two) times  daily. 05/16/22  Yes Ellenor Wisniewski, DO  rizatriptan (MAXALT-MLT) 10 MG disintegrating tablet Take 1 tablet at the onset of migraine. May repeat in 2 hours if needed 10/12/21  Yes Bonnita Hollow, MD  Methylphenidate HCl ER, PM, (JORNAY PM) 80 MG CP24 Take by mouth.    [provider]    Family History Family History  Problem Relation Age of Onset   Hypertension Mother    Hypothyroidism Mother    Thyroid disease Mother    Other Mother        benign brain tumor   Depression Mother    Hyperlipidemia Mother    ADD / ADHD Mother    Anxiety disorder Mother    Arthritis Mother    COPD Mother    Heart disease Mother    Cancer Maternal Grandfather        breast   Heart attack Maternal Grandfather    Hyperlipidemia Maternal Grandfather    Hypertension Maternal Grandfather    Breast cancer Maternal Grandfather    Heart disease Maternal Grandfather    Lupus Paternal Grandmother    Diabetes Paternal Grandfather    Alcohol abuse Father     Social History Social History   Tobacco Use   Smoking status: Never   Smokeless tobacco: Never  Vaping Use   Vaping Use: Never used  Substance Use Topics   Alcohol use: Yes    Alcohol/week: 2.0 standard drinks of alcohol    Types: 2 Glasses of wine per week    Comment: 1-2 per week   Drug use: No     Allergies   Compazine [prochlorperazine edisylate]   Review of Systems Review of Systems :negative unless otherwise stated in HPI.      Physical Exam Triage Vital Signs ED Triage Vitals  Enc Vitals Group     BP      Pulse      Resp      Temp      Temp src      SpO2      Weight      Height      Head Circumference      Peak Flow      Pain Score      Pain Loc      Pain Edu?      Excl. in [place]?    No data found.  Updated Vital Signs BP 115/84 (BP Location: Left Arm)   Pulse 99   Temp 98.6 F (37 C) (Oral)   Ht 5\' 7"  (1.702 m)   Wt 97.5 kg  SpO2 95%   BMI 33.67 kg/m   Visual Acuity Right Eye Distance:    Left Eye Distance:   Bilateral Distance:    Right Eye Near:   Left Eye Near:    Bilateral Near:     Physical Exam  GEN: well appearing female, in no acute distress  CV: regular rate and rhythm RESP: no increased work of breathing, clear to ascultation bilaterally ABD: Bowel sounds present. Soft, tenderness RLQ > periumbilical> LLQ, mildly distended. No guarding, no rebound, no appreciable hepatosplenomegaly, no CVA tenderness, positive McBurney's,negative Murphy MSK: no extremity edema SKIN: warm, dry, no rash on visible skin NEURO: alert, moves all extremities appropriately PSYCH: Normal affect, appropriate speech and behavior   UC Treatments / Results  Labs (all labs ordered are listed, but only abnormal results are displayed) Labs Reviewed  URINALYSIS, W/ REFLEX TO CULTURE (INFECTION SUSPECTED) - Abnormal; Notable for the following components:      Result Value   Bacteria, UA FEW (*)    All other components within normal limits  COMPREHENSIVE METABOLIC PANEL - Abnormal; Notable for the following components:   Sodium 134 (*)    Calcium 8.7 (*)    Total Bilirubin 0.2 (*)    All other components within normal limits  CBC WITH DIFFERENTIAL/PLATELET  LIPASE, BLOOD    EKG  If EKG performed, see my interpretation and MDM section  Radiology CT ABDOMEN PELVIS W CONTRAST  Result Date: 05/16/2022 CLINICAL DATA:  Right lower quadrant pain for 2 days. EXAM: CT ABDOMEN AND PELVIS WITH CONTRAST TECHNIQUE: Multidetector CT imaging of the abdomen and pelvis was performed using the standard protocol following bolus administration of intravenous contrast. RADIATION DOSE REDUCTION: This exam was performed according to the departmental dose-optimization program which includes automated exposure control, adjustment of the mA and/or kV according to patient size and/or use of iterative reconstruction technique. CONTRAST:  156mL OMNIPAQUE IOHEXOL 300 MG/ML  SOLN COMPARISON:  CT abdomen/pelvis  04/23/2013 FINDINGS: Lower chest: The lung bases are clear. The imaged heart is unremarkable Hepatobiliary: The liver and gallbladder are unremarkable. There is no biliary ductal dilatation. Pancreas: Unremarkable. Spleen: Unremarkable. Adrenals/Urinary Tract: The adrenals are unremarkable. Cortical scarring in the left kidney upper pole is similar to 2015. The right kidney is normal in size. There few punctate nonobstructing left upper pole and right lower pole stones. There are no suspicious renal lesions. There is no hydronephrosis or hydroureter. The bladder is unremarkable. Stomach/Bowel: There is a small hiatal hernia. The stomach is otherwise unremarkable. There is no evidence of bowel obstruction. There is no abnormal bowel wall thickening or inflammatory change. The appendix is normal. There is an ovoid hyperdense object in the cecum presumably reflecting ingested material. Vascular/Lymphatic: The abdominal aorta is normal in course and caliber. The major branch vessels are patent. The main portal and splenic veins are patent. There is no abdominal or pelvic lymphadenopathy. Reproductive: The uterus and adnexa are unremarkable. Bilateral tubal ligation clips are noted. Other: There is no ascites or free air. There is a tiny fat containing umbilical hernia, unchanged. Musculoskeletal: There is no acute osseous abnormality or suspicious osseous lesion. IMPRESSION: 1. No acute finding in the abdomen or pelvis to explain the patient's pain. Normal appendix. 2. Punctate nonobstructing bilateral renal stones. Electronically Signed   By: Valetta Mole M.D.   On: 05/16/2022 11:21     Procedures Procedures (including critical care time)  Medications Ordered in UC Medications  iohexol (OMNIPAQUE) 300 MG/ML solution 100 mL (100 mLs  Intravenous Contrast Given 05/16/22 1107)    Initial Impression / Assessment and Plan / UC Course  I have reviewed the triage vital signs and the nursing notes.  Pertinent labs &  imaging results that were available during my care of the patient were reviewed by me and considered in my medical decision making (see chart for details).       Patient is a  42 y.o. femalewith history IBS, interstitial cystitis, recurrent UTIs who presents after having insidious severe abdominal pain about 1 day ago.  Reviewed notes from Dr. Tarri Glenn, [facility] gastroenterologist, in May 2020 for IBS mixed type.  Patient previously had been put on a FODMAP diet and the lactose-free diet.  Has notes about IBS going back to 2013.  Previously seen by Dr. Roney Mans and at [facility]  Started having GI issues when she was 42 years old.  Overall, patient is well-appearing, well-hydrated, and in no acute distress.  Vital signs stable.  [place] afebrile.  On exam, she has significant right lower quadrant tenderness and mild to moderate left lower quadrant and periumbilical tenderness.  Obtained UA, CBC, CMP, and lipase.  Discussed CT abdomen pelvis and patient is agreeable.  Doubt pregnancy given history of tubal ligation and uterine ablation.  CBC normal.  UA not concerning for acute cystitis.  No hematuria to suggest acute nephrolithiasis.  CMP without significant electrolyte derailment, elevation in LFTs or abnormal serum creatinine.  Lipase not concerning for acute pancreatitis.  CT abdomen pelvis showed no acute appendicitis, pancreatitis, acute cystitis, gastritis, enteritis, IBD, ovarian cyst and cholecystitis.  She does have punctate left and right lower pole nephrolithiasis but these are likely not the cause of her pain.  She does have history of right psoas strain.  Her pain may be musculoskeletal in nature.  We will trial muscle relaxer/Robaxin.  Continue over-the-counter pain medications.  If pain does not resolve she should follow-up with her gastroenterologist.  Follow-up, return and ED precautions given. Discussed MDM, treatment plan and plan for follow-up with patient who agrees with plan.     Final Clinical Impressions(s) / UC Diagnoses   Final diagnoses:  Abdominal pain, unspecified abdominal location     Discharge Instructions      Your lab work did not show cause of your pain.  Your CT scan of your belly did not show cause of your pain.  You do have some kidney stones that are very small in both of the lower part of your kidneys.  If you start to have blood in your urine or increasing pain you may be passing kidney stones.  May be muscular in nature.  Stop by the pharmacy to pick up your muscle relaxer.  If your pain does not improve be sure to follow-up with [facility] gastroenterology.     ED Prescriptions     Medication Sig Dispense Auth. Provider   methocarbamol (ROBAXIN) 500 MG tablet Take 1 tablet (500 mg total) by mouth 2 (two) times daily. 20 tablet Lyndee Hensen, DO      PDMP not reviewed this encounter.   Lyndee Hensen, DO 05/16/22 1307

## 2022-05-16 NOTE — Discharge Instructions (Signed)
Your lab work did not show cause of your pain.  Your CT scan of your belly did not show cause of your pain.  You do have some kidney stones that are very small in both of the lower part of your kidneys.  If you start to have blood in your urine or increasing pain you may be passing kidney stones.  May be muscular in nature.  Stop by the pharmacy to pick up your muscle relaxer.  If your pain does not improve be sure to follow-up with [facility] gastroenterology.

## 2022-05-30 DIAGNOSIS — N816 Rectocele: Secondary | ICD-10-CM | POA: Diagnosis not present

## 2022-05-30 DIAGNOSIS — M533 Sacrococcygeal disorders, not elsewhere classified: Secondary | ICD-10-CM | POA: Diagnosis not present

## 2022-05-30 DIAGNOSIS — M62838 Other muscle spasm: Secondary | ICD-10-CM | POA: Diagnosis not present

## 2022-05-30 DIAGNOSIS — M25552 Pain in left hip: Secondary | ICD-10-CM | POA: Diagnosis not present

## 2022-06-06 DIAGNOSIS — K219 Gastro-esophageal reflux disease without esophagitis: Secondary | ICD-10-CM | POA: Diagnosis not present

## 2022-06-06 DIAGNOSIS — E668 Other obesity: Secondary | ICD-10-CM | POA: Diagnosis not present

## 2022-06-06 DIAGNOSIS — Q401 Congenital hiatus hernia: Secondary | ICD-10-CM | POA: Diagnosis not present

## 2022-06-13 DIAGNOSIS — M25512 Pain in left shoulder: Secondary | ICD-10-CM | POA: Diagnosis not present

## 2022-06-13 DIAGNOSIS — M9902 Segmental and somatic dysfunction of thoracic region: Secondary | ICD-10-CM | POA: Diagnosis not present

## 2022-06-13 DIAGNOSIS — M9901 Segmental and somatic dysfunction of cervical region: Secondary | ICD-10-CM | POA: Diagnosis not present

## 2022-06-13 DIAGNOSIS — M25511 Pain in right shoulder: Secondary | ICD-10-CM | POA: Diagnosis not present

## 2022-06-18 ENCOUNTER — Ambulatory Visit: Payer: BC Managed Care – PPO | Admitting: Family Medicine

## 2022-06-18 VITALS — BP 118/82 | HR 79 | Temp 97.6°F | Wt 221.6 lb

## 2022-06-18 DIAGNOSIS — K219 Gastro-esophageal reflux disease without esophagitis: Secondary | ICD-10-CM

## 2022-06-18 DIAGNOSIS — K449 Diaphragmatic hernia without obstruction or gangrene: Secondary | ICD-10-CM | POA: Diagnosis not present

## 2022-06-18 MED ORDER — PANTOPRAZOLE SODIUM 40 MG PO TBEC: 40.0000 mg | DELAYED_RELEASE_TABLET | Freq: Every day | ORAL | 3 refills | Status: DC

## 2022-06-18 MED ORDER — SIMETHICONE 80 MG PO CHEW
80.0000 mg | CHEWABLE_TABLET | Freq: Four times a day (QID) | ORAL | 0 refills | Status: DC | PRN
Start: 2022-06-18 — End: 2023-08-06

## 2022-06-18 NOTE — Progress Notes (Unsigned)
Assessment/Plan:   Problem List Items Addressed This Visit   None Visit Diagnoses     Gastroesophageal reflux disease, unspecified whether esophagitis present    -  Primary   Relevant Medications   pantoprazole (PROTONIX) 40 MG tablet   simethicone (GAS-X) 80 MG chewable tablet   Other Relevant Orders   Ambulatory referral to Gastroenterology   Hiatal hernia       Relevant Medications   simethicone (GAS-X) 80 MG chewable tablet   Other Relevant Orders   H. pylori breath test   Ambulatory referral to Gastroenterology       Medications Discontinued During This Encounter  Medication Reason   gabapentin (NEURONTIN) 600 MG tablet     No follow-ups on file.    Subjective:   Encounter date: 06/18/2022  Tanya [name] is a 42 y.o. female who has Generalized anxiety disorder with panic attacks; BMI 31.0-31.9,adult; Loose stools; Carpal tunnel syndrome of right wrist; Menstrual migraine without status migrainosus, not intractable; Recurrent UTI; Spasm of bladder; Neck muscle spasm; Attention deficit hyperactivity disorder, predominantly inattentive type; High-tone pelvic floor dysfunction; and Irritable bowel syndrome with both constipation and diarrhea on their problem list..   She  has a past medical history of ADHD (attention deficit hyperactivity disorder), Anemia, Anxiety, COVID-19, DYSPNEA (06/07/2008), GERD (gastroesophageal reflux disease), Healthcare maintenance (12/24/2017), History of chicken pox, History of pyelonephritis, IBS (irritable bowel syndrome), Infection, Insomnia (05/19/2018), Interstitial cystitis, Localized edema (05/19/2018), Migraine with aura and without status migrainosus, not intractable (02/01/2017), Migraines, Neuromuscular disorder (HCC) (07/13/2021), Numbness and tingling of left leg (05/19/2018), Placenta previa antepartum (08/03/2015), S/P cesarean section (08/07/2015), Seasonal allergies, Tachycardia, TACHYCARDIA (06/07/2008), and Tilted uterus..   She  presents with chief complaint of Hernia (Dx with Hital hernia at [facility]) .   HPI:   ROS  Past Surgical History:  Procedure Laterality Date   CESAREAN SECTION N/A 08/07/2015   Procedure: CESAREAN SECTION;  Surgeon: Marcelle Overlie, MD;  Location: [facility] BIRTHING SUITES;  Service: Obstetrics;  Laterality: N/A;   CESAREAN SECTION     dilate and curettage  2016   TUBAL LIGATION     Tubiligation      Outpatient Medications Prior to Visit  Medication Sig Dispense Refill   ALPRAZolam (XANAX) 0.25 MG tablet Take 0.25 mg by mouth 3 (three) times daily as needed.     escitalopram (LEXAPRO) 10 MG tablet Take 10 mg by mouth daily.  12   meloxicam (MOBIC) 7.5 MG tablet Take 1 to 2 tablets daily as needed for pain related to inflammation. 30 tablet 0   methocarbamol (ROBAXIN) 500 MG tablet Take 1 tablet (500 mg total) by mouth 2 (two) times daily. 20 tablet 0   Methylphenidate HCl ER, PM, (JORNAY PM) 80 MG CP24 Take by mouth.     rizatriptan (MAXALT-MLT) 10 MG disintegrating tablet Take 1 tablet at the onset of migraine. May repeat in 2 hours if needed 9 tablet 11   gabapentin (NEURONTIN) 600 MG tablet Take 600 mg by mouth once.     No facility-administered medications prior to visit.    Family History  Problem Relation Age of Onset   Hypertension Mother    Hypothyroidism Mother    Thyroid disease Mother    Other Mother        benign brain tumor   Depression Mother    Hyperlipidemia Mother    ADD / ADHD Mother    Anxiety disorder Mother    Arthritis Mother    COPD Mother  Heart disease Mother    Cancer Maternal Grandfather        breast   Heart attack Maternal Grandfather    Hyperlipidemia Maternal Grandfather    Hypertension Maternal Grandfather    Breast cancer Maternal Grandfather    Heart disease Maternal Grandfather    Lupus Paternal Grandmother    Diabetes Paternal Grandfather    Alcohol abuse Father     Social History   Socioeconomic History   Marital status: Married     Spouse name: Not on file   Number of children: 3   Years of education: college   Highest education level: Bachelor's degree (e.g., BA, AB, BS)  Occupational History    Comment: CPA  Tobacco Use   Smoking status: Never   Smokeless tobacco: Never  Vaping Use   Vaping Use: Never used  Substance and Sexual Activity   Alcohol use: Yes    Alcohol/week: 2.0 standard drinks of alcohol    Types: 2 Glasses of wine per week    Comment: 1-2 per week   Drug use: No   Sexual activity: Yes    Birth control/protection: Surgical  Other Topics Concern   Not on file  Social History Narrative   Lives at home with spouse, August Saucer, and 3 kids.  Works at [employer].  Education: college.  Caffeine 1-2 per day.    Social Determinants of Health   Financial Resource Strain: Low Risk  (06/18/2022)   Overall Financial Resource Strain (CARDIA)    Difficulty of Paying Living Expenses: Not hard at all  Food Insecurity: No Food Insecurity (06/18/2022)   Hunger Vital Sign    Worried About Running Out of Food in the Last Year: Never true    Ran Out of Food in the Last Year: Never true  Transportation Needs: No Transportation Needs (06/18/2022)   PRAPARE - Administrator, Civil Service (Medical): No    Lack of Transportation (Non-Medical): No  Physical Activity: Insufficiently Active (06/18/2022)   Exercise Vital Sign    Days of Exercise per Week: 3 days    Minutes of Exercise per Session: 30 min  Stress: No Stress Concern Present (06/18/2022)   [employer] of Occupational Health - Occupational Stress Questionnaire    Feeling of Stress : Not at all  Social Connections: Unknown (06/18/2022)   Social Connection and Isolation Panel [NHANES]    Frequency of Communication with Friends and Family: More than three times a week    Frequency of Social Gatherings with Friends and Family: Once a week    Attends Religious Services: Patient declined    Database administrator or Organizations: No     Attends Engineer, structural: Not on file    Marital Status: Married  Catering manager Violence: Not on file                                                                                                  Objective:  Physical Exam: BP 118/82 (BP Location: Left Arm, Patient Position: Sitting, Cuff Size: Large)   Pulse 79  Temp 97.6 F (36.4 C) (Temporal)   Wt 221 lb 9.6 oz (100.5 kg)   SpO2 99%   BMI 34.71 kg/m     Physical Exam Constitutional:      General: She is not in acute distress.    Appearance: Normal appearance. She is not ill-appearing or toxic-appearing.  HENT:     Head: Normocephalic and atraumatic.     Nose: Nose normal. No congestion.  Eyes:     General: No scleral icterus.    Extraocular Movements: Extraocular movements intact.  Cardiovascular:     Rate and Rhythm: Normal rate and regular rhythm.     Pulses: Normal pulses.     Heart sounds: Normal heart sounds.  Pulmonary:     Effort: Pulmonary effort is normal. No respiratory distress.     Breath sounds: Normal breath sounds.  Abdominal:     General: Abdomen is flat. Bowel sounds are normal.     Palpations: Abdomen is soft.     Tenderness: There is abdominal tenderness in the epigastric area.  Musculoskeletal:        General: Normal range of motion.  Lymphadenopathy:     Cervical: No cervical adenopathy.  Skin:    General: Skin is warm and dry.     Findings: No rash.  Neurological:     General: No focal deficit present.     Mental Status: She is alert and oriented to person, place, and time. Mental status is at baseline.  Psychiatric:        Mood and Affect: Mood normal.        Behavior: Behavior normal.        Thought Content: Thought content normal.        Judgment: Judgment normal.     CT ABDOMEN PELVIS W CONTRAST  Result Date: 05/16/2022 CLINICAL DATA:  Right lower quadrant pain for 2 days. EXAM: CT ABDOMEN AND PELVIS WITH CONTRAST TECHNIQUE: Multidetector CT imaging of the  abdomen and pelvis was performed using the standard protocol following bolus administration of intravenous contrast. RADIATION DOSE REDUCTION: This exam was performed according to the departmental dose-optimization program which includes automated exposure control, adjustment of the mA and/or kV according to patient size and/or use of iterative reconstruction technique. CONTRAST:  OMNIPAQUE IOHEXOL 300 MG/ML  SOLN COMPARISON:  CT abdomen/pelvis 04/23/2013 FINDINGS: Lower chest: The lung bases are clear. The imaged heart is unremarkable Hepatobiliary: The liver and gallbladder are unremarkable. There is no biliary ductal dilatation. Pancreas: Unremarkable. Spleen: Unremarkable. Adrenals/Urinary Tract: The adrenals are unremarkable. Cortical scarring in the left kidney upper pole is similar to 2015. The right kidney is normal in size. There few punctate nonobstructing left upper pole and right lower pole stones. There are no suspicious renal lesions. There is no hydronephrosis or hydroureter. The bladder is unremarkable. Stomach/Bowel: There is a small hiatal hernia. The stomach is otherwise unremarkable. There is no evidence of bowel obstruction. There is no abnormal bowel wall thickening or inflammatory change. The appendix is normal. There is an ovoid hyperdense object in the cecum presumably reflecting ingested material. Vascular/Lymphatic: The abdominal aorta is normal in course and caliber. The major branch vessels are patent. The main portal and splenic veins are patent. There is no abdominal or pelvic lymphadenopathy. Reproductive: The uterus and adnexa are unremarkable. Bilateral tubal ligation clips are noted. Other: There is no ascites or free air. There is a tiny fat containing umbilical hernia, unchanged. Musculoskeletal: There is no acute osseous abnormality or suspicious osseous  lesion. IMPRESSION: 1. No acute finding in the abdomen or pelvis to explain the patient's pain. Normal appendix. 2.  Punctate nonobstructing bilateral renal stones. Electronically Signed   By: Lesia Hausen M.D.   On: 05/16/2022 11:21    Recent Results (from the past 2160 hour(s))  CBC with Differential     Status: None   Collection Time: 05/16/22  9:47 AM  Result Value Ref Range   WBC 6.2 4.0 - 10.5 K/uL   RBC 5.00 3.87 - 5.11 MIL/uL   Hemoglobin 14.9 12.0 - 15.0 g/dL   HCT 78.2 95.6 - 21.3 %   MCV 87.8 80.0 - 100.0 fL   MCH 29.8 26.0 - 34.0 pg   MCHC 33.9 30.0 - 36.0 g/dL   RDW 08.6 57.8 - 46.9 %   Platelets 257 150 - 400 K/uL   nRBC 0.0 0.0 - 0.2 %   Neutrophils Relative % 60 %   Neutro Abs 3.7 1.7 - 7.7 K/uL   Lymphocytes Relative 29 %   Lymphs Abs 1.8 0.7 - 4.0 K/uL   Monocytes Relative 8 %   Monocytes Absolute 0.5 0.1 - 1.0 K/uL   Eosinophils Relative 2 %   Eosinophils Absolute 0.1 0.0 - 0.5 K/uL   Basophils Relative 1 %   Basophils Absolute 0.0 0.0 - 0.1 K/uL   Immature Granulocytes 0 %   Abs Immature Granulocytes 0.01 0.00 - 0.07 K/uL    Comment: Performed at [facility], [address], [place], [place] [address]  Lipase, blood     Status: None   Collection Time: 05/16/22  9:47 AM  Result Value Ref Range   Lipase 42 11 - 51 U/L    Comment: Performed at [facility], [address] [place], [place] [address]  Urinalysis, w/ Reflex to Culture (Infection Suspected) -Urine, Clean Catch     Status: Abnormal   Collection Time: 05/16/22  9:47 AM  Result Value Ref Range   Specimen Source URINE, CLEAN CATCH    Color, Urine YELLOW YELLOW   APPearance CLEAR CLEAR   Specific Gravity, Urine 1.020 1.005 - 1.030   pH 7.0 5.0 - 8.0   Glucose, UA NEGATIVE NEGATIVE mg/dL   Hgb urine dipstick NEGATIVE NEGATIVE   Bilirubin Urine NEGATIVE NEGATIVE   Ketones, ur NEGATIVE NEGATIVE mg/dL   Protein, ur NEGATIVE NEGATIVE mg/dL   Nitrite NEGATIVE NEGATIVE   Leukocytes,Ua NEGATIVE NEGATIVE   Squamous Epithelial / HPF 6-10 0 - 5 /HPF   WBC, UA 0-5 0 - 5 WBC/hpf    Comment:  Reflex urine culture not performed if WBC <=10, OR if Squamous epithelial cells >5. If Squamous epithelial cells >5, suggest recollection.   RBC / HPF 0-5 0 - 5 RBC/hpf   Bacteria, UA FEW (A) NONE SEEN    Comment: Performed at [facility], [address] [place], [place] [address]  Comprehensive metabolic panel     Status: Abnormal   Collection Time: 05/16/22  9:47 AM  Result Value Ref Range   Sodium 134 (L) 135 - 145 mmol/L   Potassium 4.2 3.5 - 5.1 mmol/L   Chloride 101 98 - 111 mmol/L   CO2 26 22 - 32 mmol/L   Glucose, Bld 90 70 - 99 mg/dL    Comment: Glucose reference range applies only to samples taken after fasting for at least 8 hours.   BUN 18 6 - 20 mg/dL   Creatinine, Ser 2.72 0.44 - 1.00 mg/dL  Calcium 8.7 (L) 8.9 - 10.3 mg/dL   Total Protein 7.7 6.5 - 8.1 g/dL   Albumin 4.4 3.5 - 5.0 g/dL   AST 21 15 - 41 U/L   ALT 16 0 - 44 U/L   Alkaline Phosphatase 62 38 - 126 U/L   Total Bilirubin 0.2 (L) 0.3 - 1.2 mg/dL   GFR, Estimated >16 >10 mL/min    Comment: (NOTE) Calculated using the CKD-EPI Creatinine Equation (2021)    Anion gap 7 5 - 15    Comment: Performed at [facility], [address], [place], [place] [address]        Garner Nash, MD, MS

## 2022-06-20 DIAGNOSIS — K449 Diaphragmatic hernia without obstruction or gangrene: Secondary | ICD-10-CM | POA: Insufficient documentation

## 2022-06-20 NOTE — Assessment & Plan Note (Signed)
Most likely related to GERD given symptoms and history of hiatal hernia.  Plan:  Prescribe pantoprazole 40 MG tablet, 1 tablet daily before meals to manage GERD symptoms. Advise lifestyle modifications such as avoiding late-night meals, reducing caffeine intake, and elevating the head of the bed. Recommend simethicone (Gas-X) 80 MG chewable tablet for bloating as needed. Refer to Gastroenterology for potential endoscopic evaluation due to chronicity of GERD symptoms and hiatal hernia. Order H. pylori breath test to rule out infection as a contributing factor.

## 2022-06-21 DIAGNOSIS — N816 Rectocele: Secondary | ICD-10-CM | POA: Diagnosis not present

## 2022-06-21 DIAGNOSIS — M533 Sacrococcygeal disorders, not elsewhere classified: Secondary | ICD-10-CM | POA: Diagnosis not present

## 2022-06-21 DIAGNOSIS — M25552 Pain in left hip: Secondary | ICD-10-CM | POA: Diagnosis not present

## 2022-06-21 DIAGNOSIS — M62838 Other muscle spasm: Secondary | ICD-10-CM | POA: Diagnosis not present

## 2022-06-21 LAB — H. PYLORI BREATH TEST: H. pylori Breath Test: NOT DETECTED

## 2022-07-02 ENCOUNTER — Telehealth: Payer: Self-pay

## 2022-07-02 NOTE — Telephone Encounter (Signed)
Per pharmacy Pantoprazole requires PA.  PA started via [facility] Key: [id number]

## 2022-07-03 ENCOUNTER — Other Ambulatory Visit (HOSPITAL_COMMUNITY): Payer: Self-pay

## 2022-07-08 ENCOUNTER — Ambulatory Visit: Payer: BC Managed Care – PPO

## 2022-07-13 DIAGNOSIS — M9903 Segmental and somatic dysfunction of lumbar region: Secondary | ICD-10-CM | POA: Diagnosis not present

## 2022-07-13 DIAGNOSIS — M25551 Pain in right hip: Secondary | ICD-10-CM | POA: Diagnosis not present

## 2022-07-13 DIAGNOSIS — M25561 Pain in right knee: Secondary | ICD-10-CM | POA: Diagnosis not present

## 2022-07-13 DIAGNOSIS — M25571 Pain in right ankle and joints of right foot: Secondary | ICD-10-CM | POA: Diagnosis not present

## 2022-07-16 DIAGNOSIS — F419 Anxiety disorder, unspecified: Secondary | ICD-10-CM | POA: Diagnosis not present

## 2022-07-16 DIAGNOSIS — F902 Attention-deficit hyperactivity disorder, combined type: Secondary | ICD-10-CM | POA: Diagnosis not present

## 2022-07-16 DIAGNOSIS — Z79899 Other long term (current) drug therapy: Secondary | ICD-10-CM | POA: Diagnosis not present

## 2022-08-06 DIAGNOSIS — M79662 Pain in left lower leg: Secondary | ICD-10-CM | POA: Diagnosis not present

## 2022-08-06 DIAGNOSIS — M9901 Segmental and somatic dysfunction of cervical region: Secondary | ICD-10-CM | POA: Diagnosis not present

## 2022-08-06 DIAGNOSIS — M25552 Pain in left hip: Secondary | ICD-10-CM | POA: Diagnosis not present

## 2022-08-06 DIAGNOSIS — M7662 Achilles tendinitis, left leg: Secondary | ICD-10-CM | POA: Diagnosis not present

## 2022-09-26 ENCOUNTER — Ambulatory Visit (INDEPENDENT_AMBULATORY_CARE_PROVIDER_SITE_OTHER): Payer: 59 | Admitting: Gastroenterology

## 2022-09-26 ENCOUNTER — Encounter: Payer: Self-pay | Admitting: Gastroenterology

## 2022-09-26 VITALS — BP 118/68 | HR 92 | Ht 67.0 in | Wt 223.4 lb

## 2022-09-26 DIAGNOSIS — K219 Gastro-esophageal reflux disease without esophagitis: Secondary | ICD-10-CM | POA: Diagnosis not present

## 2022-09-26 DIAGNOSIS — K449 Diaphragmatic hernia without obstruction or gangrene: Secondary | ICD-10-CM | POA: Diagnosis not present

## 2022-09-26 NOTE — Progress Notes (Signed)
HPI : Tanya [name] is a 42 y.o. female with a history of anxiety, GERD and ADHD who is referred to [place] by Garnette Gunner, MD for further evaluation and management of GERD symptoms.  The patient states that she has had problems with GERD symptoms off and on for most of her life.  Her symptoms do seem to be more frequent and persistent recently.  She describes symptoms of acid regurgitation, burning pain in her chest and throat and more recently hoarseness/voice changes.  Her symptoms can occur both during the day and night, but are much more stressful and bothersome when they awaken her from sleep. She has occasional dysphagia to pills, but denies significant problems with dysphagia to food.  No problems with nausea or vomiting.  No unintentional weight loss. She is currently having symptoms at least 1-2 times per week.  She is taking Protonix once daily, and has been taking this for about a month now.  Her symptoms were worse before she started taking this. She has previously taken multiple different courses of PPIs over the years, typically for several weeks at a time.  She has found that Protonix seems to work best for her. In addition to Protonix, she will take Tums or Pepcid as needed for breakthrough symptoms. She has made dietary adjustments in the past when her reflux symptoms flareup to include eliminating red sauces, chocolate, spicy foods and alcohol.  She will tries to sleep on her left side most nights, as this seems to help reduce reflux episodes.  She has difficulty keeping head of bed elevated because of back/neck pain.  She recently had a CT scan because of abdominal/back pain.  The CT scan was unremarkable except for a small hiatal hernia.    CT abdomen/pelvis May 16, 2022 (RLQ pain x 2 days) IMPRESSION: 1. No acute finding in the abdomen or pelvis to explain the patient's pain. Normal appendix. 2. Punctate nonobstructing bilateral renal stones. Small hiatal hernia  incidentally noted   Component Ref Range & Units 3 mo ago  H. pylori Breath Test NOT DETECTED NOT DETECTED    Past Medical History:  Diagnosis Date   ADHD (attention deficit hyperactivity disorder)    Anemia    postpartum   Anxiety    COVID-19    DYSPNEA 06/07/2008   Qualifier: Diagnosis of  By: Cristela Felt, CNA, Christy     GERD (gastroesophageal reflux disease)    Healthcare maintenance 12/24/2017   History of chicken pox    History of pyelonephritis    as child   IBS (irritable bowel syndrome)    Infection    UTI   Insomnia 05/19/2018   Interstitial cystitis    Localized edema 05/19/2018   Migraine with aura and without status migrainosus, not intractable 02/01/2017   Migraines    Neuromuscular disorder (HCC) 07/13/2021   Carpal tunnel   Numbness and tingling of left leg 05/19/2018   Placenta previa antepartum 08/03/2015   S/P cesarean section 08/07/2015   Seasonal allergies    Tachycardia    TACHYCARDIA 06/07/2008   Qualifier: Diagnosis of  By: Cristela Felt, CNA, Christy     Tilted uterus      Past Surgical History:  Procedure Laterality Date   CESAREAN SECTION N/A 08/07/2015   Procedure: CESAREAN SECTION;  Surgeon: Marcelle Overlie, MD;  Location: [facility] BIRTHING SUITES;  Service: Obstetrics;  Laterality: N/A;   CESAREAN SECTION     dilate and curettage  2016   TUBAL LIGATION  Tubiligation     Family History  Problem Relation Age of Onset   Hypertension Mother    Hypothyroidism Mother    Thyroid disease Mother    Other Mother        benign brain tumor   Depression Mother    Hyperlipidemia Mother    ADD / ADHD Mother    Anxiety disorder Mother    Arthritis Mother    COPD Mother    Heart disease Mother    Cancer Maternal Grandfather        breast   Heart attack Maternal Grandfather    Hyperlipidemia Maternal Grandfather    Hypertension Maternal Grandfather    Breast cancer Maternal Grandfather    Heart disease Maternal Grandfather    Lupus Paternal Grandmother     Diabetes Paternal Grandfather    Alcohol abuse Father    Social History   Tobacco Use   Smoking status: Never   Smokeless tobacco: Never  Vaping Use   Vaping status: Never Used  Substance Use Topics   Alcohol use: Yes    Alcohol/week: 2.0 standard drinks of alcohol    Types: 2 Glasses of wine per week    Comment: 1-2 per week   Drug use: No   Current Outpatient Medications  Medication Sig Dispense Refill   ALPRAZolam (XANAX) 0.25 MG tablet Take 0.25 mg by mouth 3 (three) times daily as needed.     escitalopram (LEXAPRO) 10 MG tablet Take 10 mg by mouth daily.  12   meloxicam (MOBIC) 7.5 MG tablet Take 1 to 2 tablets daily as needed for pain related to inflammation. 30 tablet 0   methocarbamol (ROBAXIN) 500 MG tablet Take 1 tablet (500 mg total) by mouth 2 (two) times daily. 20 tablet 0   Methylphenidate HCl ER, PM, (JORNAY PM) 80 MG CP24 Take by mouth.     pantoprazole (PROTONIX) 40 MG tablet Take 1 tablet (40 mg total) by mouth daily. 30 tablet 3   rizatriptan (MAXALT-MLT) 10 MG disintegrating tablet Take 1 tablet at the onset of migraine. May repeat in 2 hours if needed 9 tablet 11   simethicone (GAS-X) 80 MG chewable tablet Chew 1 tablet (80 mg total) by mouth every 6 (six) hours as needed for flatulence. 30 tablet 0   No current facility-administered medications for this visit.   Allergies  Allergen Reactions   Compazine [Prochlorperazine Edisylate] Other (See Comments)     Review of Systems: All systems reviewed and negative except where noted in HPI.    No results found.  Physical Exam: BP 118/68 (BP Location: Left Arm, Patient Position: Sitting, Cuff Size: Large)   Pulse 92   Ht 5\' 7"  (1.702 m)   Wt 223 lb 6 oz (101.3 kg)   SpO2 99%   BMI 34.99 kg/m  Constitutional: Pleasant,well-developed, Caucasian female in no acute distress. HEENT: Normocephalic and atraumatic. Conjunctivae are normal. No scleral icterus. Neck supple.  Cardiovascular: Normal rate,  regular rhythm.  Pulmonary/chest: Effort normal and breath sounds normal. No wheezing, rales or rhonchi. Abdominal: Soft, nondistended, nontender. Bowel sounds active throughout. There are no masses palpable. No hepatomegaly. Extremities: no edema Neurological: Alert and oriented to person place and time. Skin: Skin is warm and dry. No rashes noted. Psychiatric: Normal mood and affect. Behavior is normal.  CBC    Component Value Date/Time   WBC 6.2 05/16/2022 0947   RBC 5.00 05/16/2022 0947   HGB 14.9 05/16/2022 0947   HGB 14.4 01/16/2018 0933  HCT 43.9 05/16/2022 0947   HCT 44.0 01/16/2018 0933   PLT 257 05/16/2022 0947   PLT 254 01/16/2018 0933   MCV 87.8 05/16/2022 0947   MCV 89 01/16/2018 0933   MCH 29.8 05/16/2022 0947   MCHC 33.9 05/16/2022 0947   RDW 12.4 05/16/2022 0947   RDW 12.0 (L) 01/16/2018 0933   LYMPHSABS 1.8 05/16/2022 0947   LYMPHSABS 1.6 01/16/2018 0933   MONOABS 0.5 05/16/2022 0947   EOSABS 0.1 05/16/2022 0947   EOSABS 0.1 01/16/2018 0933   BASOSABS 0.0 05/16/2022 0947   BASOSABS 0.0 01/16/2018 0933    CMP     Component Value Date/Time   NA 134 (L) 05/16/2022 0947   NA 139 05/19/2018 1451   K 4.2 05/16/2022 0947   CL 101 05/16/2022 0947   CO2 26 05/16/2022 0947   GLUCOSE 90 05/16/2022 0947   BUN 18 05/16/2022 0947   BUN 14 05/19/2018 1451   CREATININE 0.86 05/16/2022 0947   CREATININE 0.78 04/23/2013 0916   CALCIUM 8.7 (L) 05/16/2022 0947   PROT 7.7 05/16/2022 0947   PROT 6.8 05/19/2018 1451   ALBUMIN 4.4 05/16/2022 0947   ALBUMIN 4.5 05/19/2018 1451   AST 21 05/16/2022 0947   ALT 16 05/16/2022 0947   ALKPHOS 62 05/16/2022 0947   BILITOT 0.2 (L) 05/16/2022 0947   BILITOT 0.3 05/19/2018 1451   GFRNONAA >60 05/16/2022 0947   GFRAA >60 01/06/2019 1835       Latest Ref Rng & Units 05/16/2022    9:47 AM 01/06/2019    6:35 PM 01/16/2018    9:33 AM  CBC EXTENDED  WBC 4.0 - 10.5 K/uL 6.2  8.2  6.3   RBC 3.87 - 5.11 MIL/uL 5.00  4.76  4.97    Hemoglobin 12.0 - 15.0 g/dL 40.9  81.1  91.4   HCT 36.0 - 46.0 % 43.9  42.7  44.0   Platelets 150 - 400 K/uL 257  234  254   NEUT# 1.7 - 7.7 K/uL 3.7  5.4  4.1   Lymph# 0.7 - 4.0 K/uL 1.8  2.1  1.6       ASSESSMENT AND PLAN: 42 year old female with longstanding typical GERD symptoms, partially responsive to daily PPI.  She has occasional pill dysphagia, but no significant food dysphagia and no other red flag symptoms.  She has a small hiatal hernia that was noted on a CT scan. We discussed the pathophysiology of GERD and the principles of GERD management to include lifestyle modifications  such as dietary discretion (avoidance of alcohol, tobacco, caffeinated and carbonated beverages, spicy/greasy foods, citrus, peppermint/chocolate), weight loss if applicable, head of bed elevation andconsuming last meal of day within 3 hours of bedtime; pharmacologic options to include PPIs, H2RAs and OTC antacids; and finally surgical or endoscopic fundoplication. The patient is potentially interested in fundoplication.  She would like to proceed with an upper endoscopy to further assess her hiatal hernia.   GERD/hiatal hernia - EGD - Continue once daily Protonix, PRN antacids/Pepcid - Dietary modifications - Provided handouts and further information on TIF  The details, risks (including bleeding, perforation, infection, missed lesions, medication reactions and possible hospitalization or surgery if complications occur), benefits, and alternatives to EGD with possible biopsy and possible dilation were discussed with the patient and she consents to proceed.    Nikai Quest E. Tomasa Rand, MD [facility]  I spent a total of 40 minutes reviewing the patient's medical record, interviewing and examining the patient, discussing her diagnosis and  management of her condition going forward, and documenting in the medical record   Garnette Gunner, MD

## 2022-09-26 NOTE — Patient Instructions (Signed)
You have been scheduled for an endoscopy. Please follow written instructions given to you at your visit today.  If you use inhalers (even only as needed), please bring them with you on the day of your procedure.  If you take any of the following medications, they will need to be adjusted prior to your procedure:   DO NOT TAKE 7 DAYS PRIOR TO TEST- Trulicity (dulaglutide) Ozempic, Wegovy (semaglutide) Mounjaro (tirzepatide) Bydureon Bcise (exanatide extended release)  DO NOT TAKE 1 DAY PRIOR TO YOUR TEST Rybelsus (semaglutide) Adlyxin (lixisenatide) Victoza (liraglutide) Byetta (exanatide)   If your blood pressure at your visit was 140/90 or greater, please contact your primary care physician to follow up on this.  _______________________________________________________  If you are age 56 or older, your body mass index should be between 23-30. Your Body mass index is 34.99 kg/m. If this is out of the aforementioned range listed, please consider follow up with your Primary Care Provider.  If you are age 29 or younger, your body mass index should be between 19-25. Your Body mass index is 34.99 kg/m. If this is out of the aformentioned range listed, please consider follow up with your Primary Care Provider.   ________________________________________________________  The [facility] providers would like to encourage you to use [facility] to communicate with providers for non-urgent requests or questions.  Due to long hold times on the telephone, sending your provider a message by [facility] may be a faster and more efficient way to get a response.  Please allow 48 business hours for a response.  Please remember that this is for non-urgent   It was a pleasure to see you today!  Thank you for trusting me with your gastrointestinal care!

## 2022-09-28 ENCOUNTER — Encounter: Payer: Self-pay | Admitting: Gastroenterology

## 2022-09-28 ENCOUNTER — Ambulatory Visit (AMBULATORY_SURGERY_CENTER): Payer: 59 | Admitting: Gastroenterology

## 2022-09-28 VITALS — BP 122/74 | HR 72 | Temp 98.9°F | Resp 12 | Ht 67.0 in | Wt 223.0 lb

## 2022-09-28 DIAGNOSIS — K317 Polyp of stomach and duodenum: Secondary | ICD-10-CM

## 2022-09-28 DIAGNOSIS — K219 Gastro-esophageal reflux disease without esophagitis: Secondary | ICD-10-CM | POA: Diagnosis not present

## 2022-09-28 MED ORDER — SODIUM CHLORIDE 0.9 % IV SOLN
500.0000 mL | Freq: Once | INTRAVENOUS | Status: DC
Start: 2022-09-28 — End: 2022-09-28

## 2022-09-28 NOTE — Patient Instructions (Signed)

## 2022-09-28 NOTE — Progress Notes (Signed)
To pacu, VSS. Report to [email or web address] 

## 2022-09-28 NOTE — Progress Notes (Signed)
Called to room to assist during endoscopic procedure.  Patient ID and intended procedure confirmed with present staff. Received instructions for my participation in the procedure from the performing physician.  

## 2022-09-28 NOTE — Progress Notes (Signed)
History and Physical Interval Note:  09/28/2022 7:51 AM  Tanya [name]  has presented today for endoscopic procedure(s), with the diagnosis of  Encounter Diagnosis  Name Primary?   Gastroesophageal reflux disease, unspecified whether esophagitis present Yes  .  The various methods of evaluation and treatment have been discussed with the patient and/or family. After consideration of risks, benefits and other options for treatment, the patient has consented to  the endoscopic procedure(s).   The patient's history has been reviewed, patient examined, no change in status, stable for endoscopic procedure(s).  I have reviewed the patient's chart and labs.  Questions were answered to the patient's satisfaction.     Kailin Leu E. Tomasa Rand, MD [facility]

## 2022-09-28 NOTE — Op Note (Signed)
[facility] Patient Name: Tanya [name] Procedure Date: 09/28/2022 8:04 AM MRN: [id number] Endoscopist: Lorin Picket E. Tomasa Rand , MD, [id number] Age: 42 Referring MD:  Date of Birth: 1980/09/18 Gender: Female Account #: [id number] Procedure:                Upper GI endoscopy Indications:              Esophageal reflux symptoms that persist despite                            appropriate therapy Medicines:                Monitored Anesthesia Care Procedure:                Pre-Anesthesia Assessment:                           - Prior to the procedure, a History and Physical                            was performed, and patient medications and                            allergies were reviewed. The patient's tolerance of                            previous anesthesia was also reviewed. The risks                            and benefits of the procedure and the sedation                            options and risks were discussed with the patient.                            All questions were answered, and informed consent                            was obtained. Prior Anticoagulants: The patient has                            taken no anticoagulant or antiplatelet agents. ASA                            Grade Assessment: II - A patient with mild systemic                            disease. After reviewing the risks and benefits,                            the patient was deemed in satisfactory condition to                            undergo the procedure.  After obtaining informed consent, the endoscope was                            passed under direct vision. Throughout the                            procedure, the patient's blood pressure, pulse, and                            oxygen saturations were monitored continuously. The                            GIF [id number] #[id number] was introduced through the                            mouth, and advanced to the second  part of duodenum.                            The upper GI endoscopy was accomplished without                            difficulty. The patient tolerated the procedure                            well. Scope In: Scope Out: Findings:                 The examined portions of the nasopharynx,                            oropharynx and larynx were normal.                           The Z-line was irregular.                           The gastroesophageal flap valve was visualized                            endoscopically and classified as Hill Grade IV (no                            fold, wide open lumen, hiatal hernia present).                           A 3 cm hiatal hernia was present.                           Multiple 2 to 5 mm sessile polyps with no stigmata                            of recent bleeding were found in the gastric body.                            Two of these polyps were  removed with a cold snare.                            Resection and retrieval were complete. Estimated                            blood loss was minimal.                           The exam of the stomach was otherwise normal.                           The examined duodenum was normal. Complications:            No immediate complications. Estimated Blood Loss:     Estimated blood loss was minimal. Impression:               - The examined portions of the nasopharynx,                            oropharynx and larynx were normal.                           - Z-line irregular.                           - Gastroesophageal flap valve classified as Hill                            Grade IV (no fold, wide open lumen, hiatal hernia                            present).                           - 3 cm hiatal hernia.                           - Multiple gastric polyps. Two were resected and                            retrieved. These were consistent with fundic gland                            polyps                            - Normal examined duodenum. Recommendation:           - Patient has a contact number available for                            emergencies. The signs and symptoms of potential                            delayed complications were discussed with the  patient. Return to normal activities tomorrow.                            Written discharge instructions were provided to the                            patient.                           - Resume previous diet.                           - Continue present medications.                           - Await pathology results.                           - Consider TIF + hiatal hernia repair for better                            symptom control. Caetano Oberhaus E. Tomasa Rand, MD 09/28/2022 8:26:18 AM This report has been signed electronically.

## 2022-09-28 NOTE — Progress Notes (Signed)
VS completed by CW.   Pt's states no medical or surgical changes since previsit or office visit.  

## 2022-10-01 ENCOUNTER — Telehealth: Payer: Self-pay | Admitting: *Deleted

## 2022-10-01 NOTE — Telephone Encounter (Signed)
  Follow up Call-     09/28/2022    7:26 AM  Call back number  Post procedure Call Back phone  # [phone]  Permission to leave phone message Yes    Post procedure follow up phone call. No answer at number given.  Left message on voicemail.

## 2022-12-04 ENCOUNTER — Telehealth: Payer: 59 | Admitting: Physician Assistant

## 2022-12-04 DIAGNOSIS — R3989 Other symptoms and signs involving the genitourinary system: Secondary | ICD-10-CM | POA: Diagnosis not present

## 2022-12-04 MED ORDER — CEPHALEXIN 500 MG PO CAPS: 500.0000 mg | ORAL_CAPSULE | Freq: Two times a day (BID) | ORAL | 0 refills | Status: AC

## 2022-12-04 NOTE — Progress Notes (Signed)

## 2022-12-04 NOTE — Addendum Note (Signed)
Addended byLaure Kidney on: 12/04/2022 07:56 PM   Modules accepted: Level of Service

## 2022-12-14 ENCOUNTER — Other Ambulatory Visit: Payer: Self-pay | Admitting: Medical Genetics

## 2022-12-14 DIAGNOSIS — Z006 Encounter for examination for normal comparison and control in clinical research program: Secondary | ICD-10-CM

## 2022-12-25 ENCOUNTER — Ambulatory Visit (INDEPENDENT_AMBULATORY_CARE_PROVIDER_SITE_OTHER): Payer: BC Managed Care – PPO | Admitting: Podiatry

## 2022-12-25 DIAGNOSIS — L603 Nail dystrophy: Secondary | ICD-10-CM | POA: Diagnosis not present

## 2022-12-25 NOTE — Progress Notes (Signed)
  Subjective:  Patient ID: Tanya [name], female    DOB: July 23, 1980,  MRN: [id number]  Chief Complaint  Patient presents with   Nail Problem    Left foot 3rd toe is bruised, 1st toe is discolored. Right foot 1st toe is bruised. No injuries. Believes it was caused by excessive walking. She was wearing a brand new pair of shoes for the first time. Some throbbing in the right 1st toe.     42 y.o. female presents with concern of some bruising under the right hallux nail and left third toenail.  Previously got a new pair of shoes and was walking a lot at [employer].  This caused the injury to occur.  She also took off her nail polish and noticed some discoloration and abnormal growth of the left hallux nail.  Past Medical History:  Diagnosis Date   ADHD (attention deficit hyperactivity disorder)    Anemia    postpartum   Anxiety    COVID-19    DYSPNEA 06/07/2008   Qualifier: Diagnosis of  By: Cristela Felt, CNA, Christy     GERD (gastroesophageal reflux disease)    Healthcare maintenance 12/24/2017   History of chicken pox    History of pyelonephritis    as child   IBS (irritable bowel syndrome)    Infection    UTI   Insomnia 05/19/2018   Interstitial cystitis    Localized edema 05/19/2018   Migraine with aura and without status migrainosus, not intractable 02/01/2017   Migraines    Neuromuscular disorder (HCC) 07/13/2021   Carpal tunnel   Numbness and tingling of left leg 05/19/2018   Placenta previa antepartum 08/03/2015   S/P cesarean section 08/07/2015   Seasonal allergies    Tachycardia    TACHYCARDIA 06/07/2008   Qualifier: Diagnosis of  By: Cristela Felt, CNA, Christy     Tilted uterus     Allergies  Allergen Reactions   Compazine [Prochlorperazine Edisylate] Other (See Comments)    ROS: Negative except as per HPI above  Objective:  General: AAO x3, NAD  Dermatological: Subungual hematoma present mild under the right hallux without significant pain on palpation.  Left hallux nail with  yellowing discoloration dystrophy horizontal ridging and pitting.  Vascular:  Dorsalis Pedis artery and Posterior Tibial artery pedal pulses are 2/4 bilateral.  Capillary fill time < 3 sec to all digits.   Neruologic: Grossly intact via light touch bilateral. Protective threshold intact to all sites bilateral.   Musculoskeletal: No gross boney pedal deformities bilateral. No pain, crepitus, or limitation noted with foot and ankle range of motion bilateral. Muscular strength 5/5 in all groups tested bilateral.  Gait: Unassisted, Nonantalgic.   No images are attached to the encounter.   Assessment:   1. Nail dystrophy      Plan:  Patient was evaluated and treated and all questions answered.  Onychomycosis -Educated on etiology of nail fungus. -Nail sample taken for microbiology and histology. -Baseline liver function studies ordered. Will d/c terbinafine if elevated during therapy. -Deferred sending antifungal at this time patient will continue using topical antifungal we will send terbinafine therapy following the results of the nail biopsy if there is confirmed nail fungal infection    No follow-ups on file.          Corinna Gab, DPM [facility] / [facility]

## 2022-12-26 ENCOUNTER — Other Ambulatory Visit: Payer: Self-pay

## 2022-12-27 ENCOUNTER — Other Ambulatory Visit: Payer: Self-pay | Attending: Medical Genetics

## 2023-01-02 LAB — HEPATIC FUNCTION PANEL
ALT: 14 [IU]/L (ref 0–32)
AST: 16 [IU]/L (ref 0–40)
Albumin: 4.3 g/dL (ref 3.9–4.9)
Alkaline Phosphatase: 78 [IU]/L (ref 44–121)
Bilirubin Total: 0.4 mg/dL (ref 0.0–1.2)
Bilirubin, Direct: 0.13 mg/dL (ref 0.00–0.40)
Total Protein: 7.1 g/dL (ref 6.0–8.5)

## 2023-01-16 ENCOUNTER — Encounter: Payer: Self-pay | Admitting: Podiatry

## 2023-01-18 ENCOUNTER — Telehealth: Payer: Self-pay

## 2023-01-18 NOTE — Telephone Encounter (Signed)
Tanya [name]  DOB Dec 06, 1980  Tyra from [facility] [employer] is calling in regards to a requisition being sent over for this patient to have her nail sample preformed. She stated they have been trying to get in contact with our office for over a month in regards to getting this sent over. She stated that they were supposed to expire the request 4 days ago and would like to get any information for this patient sent in before the end of the day her direct call back number is [phone]. I told her youd get back to her later this morning upon your request :)   I spoke with Tyra and faxed the requisition to two numbers, reference #[id number] Received confirmation on both

## 2023-01-24 ENCOUNTER — Other Ambulatory Visit: Payer: Self-pay | Admitting: Podiatry

## 2023-02-07 ENCOUNTER — Telehealth: Payer: Self-pay | Admitting: Family Medicine

## 2023-02-07 ENCOUNTER — Ambulatory Visit: Payer: Self-pay

## 2023-02-07 DIAGNOSIS — R3989 Other symptoms and signs involving the genitourinary system: Secondary | ICD-10-CM

## 2023-02-07 MED ORDER — NITROFURANTOIN MONOHYD MACRO 100 MG PO CAPS: 100.0000 mg | ORAL_CAPSULE | Freq: Two times a day (BID) | ORAL | 0 refills | Status: AC

## 2023-02-07 NOTE — Progress Notes (Signed)
E-Visit for Urinary Problems  We are sorry that you are not feeling well.  Here is how we plan to help!  Based on what you shared with me it looks like you most likely have a simple urinary tract infection.  A UTI (Urinary Tract Infection) is a bacterial infection of the bladder.  Most cases of urinary tract infections are simple to treat but a key part of your care is to encourage you to drink plenty of fluids and watch your symptoms carefully.  I have prescribed MacroBid 100 mg twice a day for 5 days.  Your symptoms should gradually improve. Call us if the burning in your urine worsens, you develop worsening fever, back pain or pelvic pain or if your symptoms do not resolve after completing the antibiotic.  Urinary tract infections can be prevented by drinking plenty of water to keep your body hydrated.  Also be sure when you wipe, wipe from front to back and don't hold it in!  If possible, empty your bladder every 4 hours.  HOME CARE Drink plenty of fluids Compete the full course of the antibiotics even if the symptoms resolve Remember, when you need to go.go. Holding in your urine can increase the likelihood of getting a UTI! GET HELP RIGHT AWAY IF: You cannot urinate You get a high fever Worsening back pain occurs You see blood in your urine You feel sick to your stomach or throw up You feel like you are going to pass out  MAKE SURE YOU  Understand these instructions. Will watch your condition. Will get help right away if you are not doing well or get worse.   Thank you for choosing an e-visit.  Your e-visit answers were reviewed by a board certified advanced clinical practitioner to complete your personal care plan. Depending upon the condition, your plan could have included both over the counter or prescription medications.  Please review your pharmacy choice. Make sure the pharmacy is open so you can pick up prescription now. If there is a problem, you may contact your  provider through MyChart messaging and have the prescription routed to another pharmacy.  Your safety is important to us. If you have drug allergies check your prescription carefully.   For the next 24 hours you can use MyChart to ask questions about today's visit, request a non-urgent call back, or ask for a work or school excuse. You will get an email in the next two days asking about your experience. I hope that your e-visit has been valuable and will speed your recovery.  I provided 5 minutes of non face-to-face time during this encounter for chart review, medication and order placement, as well as and documentation.   

## 2023-02-25 ENCOUNTER — Encounter: Payer: Self-pay | Admitting: Family Medicine

## 2023-02-25 ENCOUNTER — Other Ambulatory Visit: Payer: Self-pay | Admitting: Family Medicine

## 2023-02-25 ENCOUNTER — Encounter: Payer: Self-pay | Admitting: Gastroenterology

## 2023-02-25 DIAGNOSIS — K219 Gastro-esophageal reflux disease without esophagitis: Secondary | ICD-10-CM

## 2023-02-26 ENCOUNTER — Other Ambulatory Visit: Payer: Self-pay

## 2023-02-26 DIAGNOSIS — G43829 Menstrual migraine, not intractable, without status migrainosus: Secondary | ICD-10-CM

## 2023-02-26 MED ORDER — RIZATRIPTAN BENZOATE 10 MG PO TBDP: ORAL_TABLET | ORAL | 11 refills | Status: AC

## 2023-03-13 ENCOUNTER — Other Ambulatory Visit: Payer: Self-pay | Admitting: Obstetrics and Gynecology

## 2023-03-13 DIAGNOSIS — R928 Other abnormal and inconclusive findings on diagnostic imaging of breast: Secondary | ICD-10-CM

## 2023-03-28 ENCOUNTER — Ambulatory Visit
Admission: RE | Admit: 2023-03-28 | Discharge: 2023-03-28 | Disposition: A | Discharge status: Home / Self Care | Payer: Self-pay | Source: Ambulatory Visit | Attending: Obstetrics and Gynecology | Admitting: Obstetrics and Gynecology

## 2023-03-28 ENCOUNTER — Telehealth (HOSPITAL_COMMUNITY): Payer: Self-pay

## 2023-03-28 ENCOUNTER — Ambulatory Visit: Payer: BC Managed Care – PPO | Admitting: Podiatry

## 2023-03-28 DIAGNOSIS — R928 Other abnormal and inconclusive findings on diagnostic imaging of breast: Secondary | ICD-10-CM

## 2023-03-28 NOTE — Telephone Encounter (Signed)
Pharmacy Patient Advocate Encounter   Received notification from CoverMyMeds that prior authorization for  is required/requested.  Pantoprazole Sodium 40MG  dr tablets Insurance verification completed.   The patient is insured through [facility] .   Per test claim: PA required; PA started via CoverMyMeds. KEY [id number] . Waiting for clinical questions to populate.

## 2023-04-01 ENCOUNTER — Telehealth: Payer: Self-pay

## 2023-04-01 ENCOUNTER — Other Ambulatory Visit (HOSPITAL_COMMUNITY): Payer: Self-pay

## 2023-04-01 NOTE — Telephone Encounter (Signed)
PA request has been Submitted. New Encounter created for follow up. For additional info see Pharmacy Prior Auth telephone encounter from 04/01/23.

## 2023-04-01 NOTE — Telephone Encounter (Signed)
Pharmacy Patient Advocate Encounter   Received notification from Fax that prior authorization for Pantoprazole Sodium 40MG  dr tablets is required/requested.   Insurance verification completed.   The patient is insured through [facility] .   Per test claim: PA required and submitted KEY/EOC/Request #: [id number] APPROVED from 04/01/23 to 03/31/24. Ran test claim, Copay is $2.35. This test claim was processed through [facility]- copay amounts may vary at other pharmacies due to pharmacy/plan contracts, or as the patient moves through the different stages of their insurance plan.

## 2023-07-10 ENCOUNTER — Telehealth: Payer: Self-pay | Admitting: Family Medicine

## 2023-07-10 DIAGNOSIS — J069 Acute upper respiratory infection, unspecified: Secondary | ICD-10-CM

## 2023-07-10 MED ORDER — BENZONATATE 100 MG PO CAPS: 100.0000 mg | ORAL_CAPSULE | Freq: Three times a day (TID) | ORAL | 0 refills | Status: DC | PRN

## 2023-07-10 MED ORDER — ALBUTEROL SULFATE HFA 108 (90 BASE) MCG/ACT IN AERS: 1.0000 | INHALATION_SPRAY | Freq: Four times a day (QID) | RESPIRATORY_TRACT | 0 refills | Status: AC | PRN

## 2023-07-10 NOTE — Progress Notes (Signed)
E-Visit for Cough  We are sorry that you are not feeling well.  Here is how we plan to help!  Based on your presentation I believe you most likely have A cough due to a virus.  This is called viral bronchitis and is best treated by rest, plenty of fluids and control of the cough.  You may use Ibuprofen or Tylenol as directed to help your symptoms.     In addition you may use A prescription cough medication called Tessalon Perles 100mg. You may take 1-2 capsules every 8 hours as needed for your cough.   From your responses in the eVisit questionnaire you describe inflammation in the upper respiratory tract which is causing a significant cough.  This is commonly called Bronchitis and has four common causes:   Allergies Viral Infections Acid Reflux Bacterial Infection Allergies, viruses and acid reflux are treated by controlling symptoms or eliminating the cause. An example might be a cough caused by taking certain blood pressure medications. You stop the cough by changing the medication. Another example might be a cough caused by acid reflux. Controlling the reflux helps control the cough.  USE OF BRONCHODILATOR ("RESCUE") INHALERS: There is a risk from using your bronchodilator too frequently.  The risk is that over-reliance on a medication which only relaxes the muscles surrounding the breathing tubes can reduce the effectiveness of medications prescribed to reduce swelling and congestion of the tubes themselves.  Although you feel brief relief from the bronchodilator inhaler, your asthma may actually be worsening with the tubes becoming more swollen and filled with mucus.  This can delay other crucial treatments, such as oral steroid medications. If you need to use a bronchodilator inhaler daily, several times per day, you should discuss this with your provider.  There are probably better treatments that could be used to keep your asthma under control.     HOME CARE Only take medications as  instructed by your medical team. Complete the entire course of an antibiotic. Drink plenty of fluids and get plenty of rest. Avoid close contacts especially the very young and the elderly Cover your mouth if you cough or cough into your sleeve. Always remember to wash your hands A steam or ultrasonic humidifier can help congestion.   GET HELP RIGHT AWAY IF: You develop worsening fever. You become short of breath You cough up blood. Your symptoms persist after you have completed your treatment plan MAKE SURE YOU  Understand these instructions. Will watch your condition. Will get help right away if you are not doing well or get worse.    Thank you for choosing an e-visit.  Your e-visit answers were reviewed by a board certified advanced clinical practitioner to complete your personal care plan. Depending upon the condition, your plan could have included both over the counter or prescription medications.  Please review your pharmacy choice. Make sure the pharmacy is open so you can pick up prescription now. If there is a problem, you may contact your provider through MyChart messaging and have the prescription routed to another pharmacy.  Your safety is important to us. If you have drug allergies check your prescription carefully.   For the next 24 hours you can use MyChart to ask questions about today's visit, request a non-urgent call back, or ask for a work or school excuse. You will get an email in the next two days asking about your experience. I hope that your e-visit has been valuable and will speed your recovery.    I provided 5 minutes of non face-to-face time during this encounter for chart review, medication and order placement, as well as and documentation.   . 

## 2023-07-13 ENCOUNTER — Encounter: Payer: Self-pay | Admitting: Emergency Medicine

## 2023-07-13 ENCOUNTER — Ambulatory Visit
Admission: EM | Admit: 2023-07-13 | Discharge: 2023-07-13 | Disposition: A | Discharge status: Home / Self Care | Attending: Family Medicine | Admitting: Family Medicine

## 2023-07-13 ENCOUNTER — Ambulatory Visit (INDEPENDENT_AMBULATORY_CARE_PROVIDER_SITE_OTHER)

## 2023-07-13 DIAGNOSIS — J40 Bronchitis, not specified as acute or chronic: Secondary | ICD-10-CM | POA: Diagnosis not present

## 2023-07-13 MED ORDER — PREDNISONE 10 MG (21) PO TBPK: ORAL_TABLET | Freq: Every day | ORAL | 0 refills | Status: DC

## 2023-07-13 MED ORDER — HYDROCODONE BIT-HOMATROP MBR 5-1.5 MG/5ML PO SOLN: 5.0000 mL | Freq: Four times a day (QID) | ORAL | 0 refills | Status: AC | PRN

## 2023-07-13 NOTE — ED Provider Notes (Signed)
 [facility]    CSN: [id number] Arrival date & time: 07/13/23  1037      History   Chief Complaint Chief Complaint  Patient presents with   Cough    HPI Tanya [name] is a 43 y.o. female.   HPI  History obtained from the patient. Tanya [name] presents for persistent cough and chest congestion with green sputum for the past 6 days.  She did an e-visit on Wednesday and was given a cough suppressant and an inhaler.  She reports no improvement with those.  Has been no fever, vomiting, diarrhea, nasal congestion, rhinorrhea.  She endorses headache, body aches and fatigue.  Of note, her child had similar symptoms last week.    Denies history of asthma, smoking or vaping.    Past Medical History:  Diagnosis Date   ADHD (attention deficit hyperactivity disorder)    Anemia    postpartum   Anxiety    COVID-19    DYSPNEA 06/07/2008   Qualifier: Diagnosis of  By: Adelle Agent, CNA, Christy     GERD (gastroesophageal reflux disease)    Healthcare maintenance 12/24/2017   History of chicken pox    History of pyelonephritis    as child   IBS (irritable bowel syndrome)    Infection    UTI   Insomnia 05/19/2018   Interstitial cystitis    Localized edema 05/19/2018   Migraine with aura and without status migrainosus, not intractable 02/01/2017   Migraines    Neuromuscular disorder (HCC) 07/13/2021   Carpal tunnel   Numbness and tingling of left leg 05/19/2018   Placenta previa antepartum 08/03/2015   S/P cesarean section 08/07/2015   Seasonal allergies    Tachycardia    TACHYCARDIA 06/07/2008   Qualifier: Diagnosis of  By: Adelle Agent, CNA, Christy     Tilted uterus     Patient Active Problem List   Diagnosis Date Noted   Hiatal hernia 06/20/2022   Loose stools 08/23/2021   Carpal tunnel syndrome of right wrist 08/23/2021   Menstrual migraine without status migrainosus, not intractable 08/23/2021   Recurrent UTI 08/23/2021   Spasm of bladder 08/23/2021   Neck muscle spasm 08/23/2021    Irritable bowel syndrome with both constipation and diarrhea 08/23/2021   BMI 31.0-31.9,adult 01/22/2018   Gastroesophageal reflux disease 12/24/2017   Generalized anxiety disorder with panic attacks 12/24/2017   Attention deficit hyperactivity disorder, 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   High-tone pelvic floor dysfunction 11/25/2014    Past Surgical History:  Procedure Laterality Date   CESAREAN SECTION N/A 08/07/2015   Procedure: CESAREAN SECTION;  Surgeon: Thurman Flores, MD;  Location: [facility];  Service: Obstetrics;  Laterality: N/A;   CESAREAN SECTION     dilate and curettage  2016   TUBAL LIGATION     Tubiligation      OB History     Gravida  5   Para  3   Term  2   Preterm  1   AB  2   Living  3      SAB  2   IAB      Ectopic      Multiple  0   Live Births  3            Home Medications    Prior to Admission medications   Medication Sig Start Date End Date Taking? Authorizing Provider  buPROPion (WELLBUTRIN XL) 300 MG 24 hr tablet Take 300 mg by mouth every morning.   Yes  [provider]  cyclobenzaprine  (FLEXERIL ) 5 MG tablet  05/10/23  Yes [provider]  escitalopram (LEXAPRO) 10 MG tablet Take 10 mg by mouth daily. 12/04/17  Yes [provider]  HYDROcodone bit-homatropine (HYCODAN) 5-1.5 MG/5ML syrup Take 5 mLs by mouth every 6 (six) hours as needed for cough. 07/13/23  Yes Rene Gonsoulin, DO  pantoprazole  (PROTONIX ) 40 MG tablet TAKE 1 TABLET (40 MG TOTAL) BY MOUTH DAILY. 02/25/23  Yes Catheryn Cluck, MD  predniSONE (STERAPRED UNI-PAK 21 TAB) 10 MG (21) TBPK tablet Take by mouth daily. Take 6 tabs by mouth daily for 1, then 5 tabs for 1 day, then 4 tabs for 1 day, then 3 tabs for 1 day, then 2 tabs for 1 day, then 1 tab for 1 day. 07/13/23  Yes Tommie Dejoseph, DO  albuterol  (VENTOLIN  HFA) 108 (90 Base) MCG/ACT inhaler Inhale 1-2 puffs into the lungs every 6 (six) hours as needed for wheezing or  shortness of breath (cough). 07/10/23   Lanetta Pion, NP  ALPRAZolam  (XANAX ) 0.25 MG tablet Take 0.25 mg by mouth 3 (three) times daily as needed. 01/01/19   [provider]  benzonatate  (TESSALON ) 100 MG capsule Take 1 capsule (100 mg total) by mouth 3 (three) times daily as needed for cough. 07/10/23   Lanetta Pion, NP  meloxicam  (MOBIC ) 7.5 MG tablet Take 1 to 2 tablets daily as needed for pain related to inflammation. 11/14/19   Buena Carmine, NP  methocarbamol  (ROBAXIN ) 500 MG tablet Take 1 tablet (500 mg total) by mouth 2 (two) times daily. 05/16/22   Yosselyn Tax, DO  Methylphenidate HCl ER, PM, (JORNAY PM) 80 MG CP24 Take by mouth.    [provider]  rizatriptan  (MAXALT -MLT) 10 MG disintegrating tablet Take 1 tablet at the onset of migraine. May repeat in 2 hours if needed 02/26/23   Catheryn Cluck, MD  simethicone  (GAS-X) 80 MG chewable tablet Chew 1 tablet (80 mg total) by mouth every 6 (six) hours as needed for flatulence. 06/18/22   Catheryn Cluck, MD    Family History Family History  Problem Relation Age of Onset   Hypertension Mother    Hypothyroidism Mother    Thyroid  disease Mother    Other Mother        benign brain tumor   Depression Mother    Hyperlipidemia Mother    ADD / ADHD Mother    Anxiety disorder Mother    Arthritis Mother    COPD Mother    Heart disease Mother    Cancer Maternal Grandfather        breast   Heart attack Maternal Grandfather    Hyperlipidemia Maternal Grandfather    Hypertension Maternal Grandfather    Breast cancer Maternal Grandfather    Heart disease Maternal Grandfather    Lupus Paternal Grandmother    Diabetes Paternal Grandfather    Alcohol abuse Father     Social History Social History   Tobacco Use   Smoking status: Never   Smokeless tobacco: Never  Vaping Use   Vaping status: Never Used  Substance Use Topics   Alcohol use: Yes    Alcohol/week: 2.0 standard drinks of alcohol    Types: 2  Glasses of wine per week    Comment: 1-2 per week   Drug use: No     Allergies   Compazine  [prochlorperazine  edisylate]   Review of Systems Review of Systems: negative unless otherwise stated in HPI.  Physical Exam Triage Vital Signs ED Triage Vitals  Encounter Vitals Group     BP 07/13/23 1055 136/84     Systolic BP Percentile --      Diastolic BP Percentile --      Pulse Rate 07/13/23 1055 92     Resp 07/13/23 1055 14     Temp 07/13/23 1055 98.8 F (37.1 C)     Temp Source 07/13/23 1055 Oral     SpO2 07/13/23 1055 95 %     Weight 07/13/23 1052 223 lb 1.7 oz (101.2 kg)     Height 07/13/23 1052 5\' 7"  (1.702 m)     Head Circumference --      Peak Flow --      Pain Score 07/13/23 1052 0     Pain Loc --      Pain Education --      Exclude from Growth Chart --    No data found.  Updated Vital Signs BP 136/84 (BP Location: Right Arm)   Pulse 92   Temp 98.8 F (37.1 C) (Oral)   Resp 14   Ht 5\' 7"  (1.702 m)   Wt 101.2 kg   SpO2 95%   BMI 34.94 kg/m   Visual Acuity Right Eye Distance:   Left Eye Distance:   Bilateral Distance:    Right Eye Near:   Left Eye Near:    Bilateral Near:     Physical Exam GEN:     alert, non-toxic appearing female in no distress    EYES:  no scleral injection or discharge RESP:  no increased work of breathing, clear to auscultation bilaterally, frequent cough CVS:   regular rate and rhythm Skin:   warm and dry, no rash on visible skin    UC Treatments / Results  Labs (all labs ordered are listed, but only abnormal results are displayed) Labs Reviewed - No data to display  EKG   Radiology DG Chest 2 View Result Date: 07/13/2023 CLINICAL DATA:  Cough and chest congestion for 1 week. EXAM: CHEST - 2 VIEW COMPARISON:  01/06/2019 FINDINGS: The heart size and mediastinal contours are within normal limits. Both lungs are clear. The visualized skeletal structures are unremarkable. IMPRESSION: No active cardiopulmonary  disease. Electronically Signed   By: Marlyce Sine M.D.   On: 07/13/2023 11:58    Procedures Procedures (including critical care time)  Medications Ordered in UC Medications - No data to display  Initial Impression / Assessment and Plan / UC Course  I have reviewed the triage vital signs and the nursing notes.  Pertinent labs & imaging results that were available during my care of the patient were reviewed by me and considered in my medical decision making (see chart for details).       Pt is a 43 y.o. female who presents for 6 days of respiratory symptoms. Taresa is afebrile here without recent antipyretics. Satting well on room air. Overall pt is non-toxic appearing, well hydrated, without respiratory distress. Pulmonary exam is unremarkable.  COVID and influenza panel deferred due to duration of symptoms.  Patient worried about deep congestion and green sputum and we discussed a chest x-ray to check for pneumonia.  She is agreeable to this.    Chest xray personally reviewed by me without focal pneumonia, pleural effusion, cardiomegaly or pneumothorax.   History consistent with viral bronchitis.  Prescribed prednisone and Hycodan. Continue Tessalon  perles and albuterol  inhaler. Discussed symptomatic treatment.  Explained lack of efficacy of antibiotics in  viral disease.  Typical duration of symptoms discussed.   Return and ED precautions given and voiced understanding. Discussed MDM, treatment plan and plan for follow-up with patient who agrees with plan.     Final Clinical Impressions(s) / UC Diagnoses   Final diagnoses:  Bronchitis     Discharge Instructions      Your chest xray did not show evidence of pneumonia though the radiologist has not yet read it. If they find something that I didn't, I will call you.    Stop by the pharmacy to pick up your prescriptions.  Follow up with your primary care provider or return to the urgent care, if not improving.      ED  Prescriptions     Medication Sig Dispense Auth. Provider   HYDROcodone bit-homatropine (HYCODAN) 5-1.5 MG/5ML syrup Take 5 mLs by mouth every 6 (six) hours as needed for cough. 120 mL Thatcher Doberstein, DO   predniSONE (STERAPRED UNI-PAK 21 TAB) 10 MG (21) TBPK tablet Take by mouth daily. Take 6 tabs by mouth daily for 1, then 5 tabs for 1 day, then 4 tabs for 1 day, then 3 tabs for 1 day, then 2 tabs for 1 day, then 1 tab for 1 day. 21 tablet Devri Kreher, DO      I have reviewed the PDMP during this encounter.   Fidel Huddle, DO 07/13/23 1213

## 2023-07-13 NOTE — ED Triage Notes (Signed)
 Patient c/o cough and chest congestion for a week.  Patient denies fevers.  Patient did E-Visit on Wed and was given an inhaler.  Patient reports no improvement.

## 2023-07-13 NOTE — Discharge Instructions (Signed)
 Your chest xray did not show evidence of pneumonia  though the radiologist has not yet read it. If they find something that I didn't, I will call you.    Stop by the pharmacy to pick up your prescriptions.  Follow up with your primary care provider or return to the urgent care, if not improving.

## 2023-08-06 ENCOUNTER — Telehealth: Admitting: Physician Assistant

## 2023-08-06 DIAGNOSIS — R3989 Other symptoms and signs involving the genitourinary system: Secondary | ICD-10-CM | POA: Diagnosis not present

## 2023-08-06 MED ORDER — NITROFURANTOIN MONOHYD MACRO 100 MG PO CAPS
100.0000 mg | ORAL_CAPSULE | Freq: Two times a day (BID) | ORAL | 0 refills | Status: DC
Start: 2023-08-06 — End: 2023-11-16

## 2023-08-06 NOTE — Progress Notes (Signed)
 I have spent 5 minutes in review of e-visit questionnaire, review and updating patient chart, medical decision making and response to patient.   Piedad Climes, PA-C

## 2023-08-06 NOTE — Progress Notes (Signed)

## 2023-08-15 ENCOUNTER — Other Ambulatory Visit: Payer: Self-pay | Admitting: Family Medicine

## 2023-08-15 DIAGNOSIS — K219 Gastro-esophageal reflux disease without esophagitis: Secondary | ICD-10-CM

## 2023-08-28 ENCOUNTER — Ambulatory Visit: Admitting: Family Medicine

## 2023-09-14 ENCOUNTER — Ambulatory Visit

## 2023-11-16 ENCOUNTER — Telehealth: Admitting: Nurse Practitioner

## 2023-11-16 DIAGNOSIS — R3989 Other symptoms and signs involving the genitourinary system: Secondary | ICD-10-CM | POA: Diagnosis not present

## 2023-11-16 MED ORDER — NITROFURANTOIN MONOHYD MACRO 100 MG PO CAPS: 100.0000 mg | ORAL_CAPSULE | Freq: Two times a day (BID) | ORAL | 0 refills | Status: DC

## 2023-11-16 NOTE — Progress Notes (Signed)

## 2023-12-02 ENCOUNTER — Ambulatory Visit

## 2023-12-03 ENCOUNTER — Other Ambulatory Visit: Payer: Self-pay | Admitting: Medical Genetics

## 2023-12-03 DIAGNOSIS — Z006 Encounter for examination for normal comparison and control in clinical research program: Secondary | ICD-10-CM

## 2023-12-06 ENCOUNTER — Ambulatory Visit
Admission: EM | Admit: 2023-12-06 | Discharge: 2023-12-06 | Disposition: A | Discharge status: Home / Self Care | Attending: Nurse Practitioner | Admitting: Nurse Practitioner

## 2023-12-06 ENCOUNTER — Encounter: Payer: Self-pay | Admitting: Emergency Medicine

## 2023-12-06 DIAGNOSIS — R300 Dysuria: Secondary | ICD-10-CM

## 2023-12-06 DIAGNOSIS — N3001 Acute cystitis with hematuria: Secondary | ICD-10-CM

## 2023-12-06 LAB — POCT URINE DIPSTICK
Bilirubin, UA: NEGATIVE
Glucose, UA: NEGATIVE mg/dL
Ketones, POC UA: NEGATIVE mg/dL
Nitrite, UA: POSITIVE — AB
Protein Ur, POC: NEGATIVE mg/dL
Spec Grav, UA: 1.015 (ref 1.010–1.025)
Urobilinogen, UA: 0.2 U/dL
pH, UA: 6 (ref 5.0–8.0)

## 2023-12-06 MED ORDER — PHENAZOPYRIDINE HCL 200 MG PO TABS: 200.0000 mg | ORAL_TABLET | ORAL | 0 refills | Status: AC

## 2023-12-06 MED ORDER — SULFAMETHOXAZOLE-TRIMETHOPRIM 800-160 MG PO TABS
1.0000 | ORAL_TABLET | Freq: Two times a day (BID) | ORAL | 0 refills | Status: AC
Start: 2023-12-06 — End: 2023-12-13

## 2023-12-06 MED ORDER — ONDANSETRON 8 MG PO TBDP: 8.0000 mg | ORAL_TABLET | Freq: Three times a day (TID) | ORAL | 0 refills | Status: AC | PRN

## 2023-12-06 NOTE — ED Provider Notes (Signed)
 [facility]    CSN: [id number] Arrival date & time: 12/06/23  1343      History   Chief Complaint Chief Complaint  Patient presents with   Urinary Frequency    HPI Tanya [name] is a 43 y.o. female.   Discussed the use of AI scribe software for clinical note transcription with the patient, who gave verbal consent to proceed.   The patient presents with urinary frequency and urgency and is concerned about a possible urinary tract or kidney infection. She recently completed a course of Macrobid  prescribed via e-visit for a suspected UTI approximately two weeks ago. Symptoms improved somewhat last week with intermittent use of AZO. Her current symptoms returned today and include urinary frequency, burning with urination, cloudy urine, and very foul-smelling urine. She also reports nausea, generalized stomach discomfort, right lower back pain, and headache, stating she just doesn't feel good. She denies any measured fevers.  The patient has a long-standing history of recurrent urinary tract infections since childhood, including renal reflux, and reports that these symptoms feel similar to her typical UTI episodes.  The following sections of the patient's history were reviewed and updated as appropriate: allergies, current medications, past family history, past medical history, past social history, past surgical history, and problem list.     Past Medical History:  Diagnosis Date   ADHD (attention deficit hyperactivity disorder)    Anemia    postpartum   Anxiety    COVID-19    DYSPNEA 06/07/2008   Qualifier: Diagnosis of  By: Leana, CNA, Christy     GERD (gastroesophageal reflux disease)    Healthcare maintenance 12/24/2017   History of chicken pox    History of pyelonephritis    as child   IBS (irritable bowel syndrome)    Infection    UTI   Insomnia 05/19/2018   Interstitial cystitis    Localized edema 05/19/2018   Migraine with aura and without status  migrainosus, not intractable 02/01/2017   Migraines    Neuromuscular disorder (HCC) 07/13/2021   Carpal tunnel   Numbness and tingling of left leg 05/19/2018   Placenta previa antepartum 08/03/2015   S/P cesarean section 08/07/2015   Seasonal allergies    Tachycardia    TACHYCARDIA 06/07/2008   Qualifier: Diagnosis of  By: Leana, CNA, Christy     Tilted uterus     Patient Active Problem List   Diagnosis Date Noted   Hiatal hernia 06/20/2022   Loose stools 08/23/2021   Carpal tunnel syndrome of right wrist 08/23/2021   Menstrual migraine without status migrainosus, not intractable 08/23/2021   Recurrent UTI 08/23/2021   Spasm of bladder 08/23/2021   Neck muscle spasm 08/23/2021   Irritable bowel syndrome with both constipation and diarrhea 08/23/2021   BMI 31.0-31.9,adult 01/22/2018   Gastroesophageal reflux disease 12/24/2017   Generalized anxiety disorder with panic attacks 12/24/2017   Attention deficit hyperactivity disorder, 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   High-tone pelvic floor dysfunction 11/25/2014    Past Surgical History:  Procedure Laterality Date   CESAREAN SECTION N/A 08/07/2015   Procedure: CESAREAN SECTION;  Surgeon: Rosaline Cobble, MD;  Location: [facility];  Service: Obstetrics;  Laterality: N/A;   CESAREAN SECTION     dilate and curettage  2016   TUBAL LIGATION     Tubiligation      OB History     Gravida  5   Para  3   Term  2   Preterm  1  AB  2   Living  3      SAB  2   IAB      Ectopic      Multiple  0   Live Births  3            Home Medications    Prior to Admission medications   Medication Sig Start Date End Date Taking? Authorizing Provider  ondansetron  (ZOFRAN -ODT) 8 MG disintegrating tablet Take 1 tablet (8 mg total) by mouth every 8 (eight) hours as needed for nausea or vomiting. 12/06/23  Yes Iola Lukes, FNP  phenazopyridine  (PYRIDIUM ) 200 MG tablet Take 1 tablet (200 mg total) by mouth 3  (three) times daily at 8am, 3pm and bedtime for 2 days. 12/06/23 12/08/23 Yes Iola Lukes, FNP  sulfamethoxazole -trimethoprim  (BACTRIM  DS) 800-160 MG tablet Take 1 tablet by mouth 2 (two) times daily for 7 days. 12/06/23 12/13/23 Yes Iola Lukes, FNP  albuterol  (VENTOLIN  HFA) 108 (90 Base) MCG/ACT inhaler Inhale 1-2 puffs into the lungs every 6 (six) hours as needed for wheezing or shortness of breath (cough). 07/10/23   Moishe Chiquita HERO, NP  ALPRAZolam  (XANAX ) 0.25 MG tablet Take 0.25 mg by mouth 3 (three) times daily as needed. 01/01/19   [provider]  buPROPion (WELLBUTRIN XL) 300 MG 24 hr tablet Take 300 mg by mouth every morning.    [provider]  cyclobenzaprine  (FLEXERIL ) 5 MG tablet  05/10/23   [provider]  escitalopram (LEXAPRO) 10 MG tablet Take 10 mg by mouth daily. 12/04/17   [provider]  HYDROcodone  bit-homatropine (HYCODAN) 5-1.5 MG/5ML syrup Take 5 mLs by mouth every 6 (six) hours as needed for cough. 07/13/23   Brimage, Vondra, DO  meloxicam  (MOBIC ) 7.5 MG tablet Take 1 to 2 tablets daily as needed for pain related to inflammation. 11/14/19   Arloa Suzen RAMAN, NP  Methylphenidate HCl ER, PM, (JORNAY PM) 80 MG CP24 Take by mouth.    [provider]  pantoprazole  (PROTONIX ) 40 MG tablet TAKE 1 TABLET (40 MG TOTAL) BY MOUTH DAILY. 02/25/23   Sebastian Beverley NOVAK, MD  rizatriptan  (MAXALT -MLT) 10 MG disintegrating tablet Take 1 tablet at the onset of migraine. May repeat in 2 hours if needed 02/26/23   Sebastian Beverley NOVAK, MD    Family History Family History  Problem Relation Age of Onset   Hypertension Mother    Hypothyroidism Mother    Thyroid  disease Mother    Other Mother        benign brain tumor   Depression Mother    Hyperlipidemia Mother    ADD / ADHD Mother    Anxiety disorder Mother    Arthritis Mother    COPD Mother    Heart disease Mother    Cancer Maternal Grandfather        breast   Heart attack  Maternal Grandfather    Hyperlipidemia Maternal Grandfather    Hypertension Maternal Grandfather    Breast cancer Maternal Grandfather    Heart disease Maternal Grandfather    Lupus Paternal Grandmother    Diabetes Paternal Grandfather    Alcohol abuse Father     Social History Social History   Tobacco Use   Smoking status: Never   Smokeless tobacco: Never  Vaping Use   Vaping status: Never Used  Substance Use Topics   Alcohol use: Yes    Alcohol/week: 2.0 standard drinks of alcohol    Types: 2 Glasses of wine per week  Comment: 1-2 per week   Drug use: No     Allergies   Compazine  [prochlorperazine  edisylate]   Review of Systems Review of Systems  Constitutional:  Negative for fever.  Gastrointestinal:  Positive for nausea. Negative for abdominal pain and vomiting.  Genitourinary:  Positive for dysuria, frequency and urgency.       Cloudy urine with odor   Musculoskeletal:  Positive for back pain (right, lower).  Neurological:  Positive for headaches.  All other systems reviewed and are negative.    Physical Exam Triage Vital Signs ED Triage Vitals [12/06/23 1355]  Encounter Vitals Group     BP 114/71     Girls Systolic BP Percentile      Girls Diastolic BP Percentile      Boys Systolic BP Percentile      Boys Diastolic BP Percentile      Pulse Rate (!) 105     Resp 16     Temp 98 F (36.7 C)     Temp Source Oral     SpO2 97 %     Weight      Height      Head Circumference      Peak Flow      Pain Score      Pain Loc      Pain Education      Exclude from Growth Chart    No data found.  Updated Vital Signs BP 114/71 (BP Location: Right Arm)   Pulse (!) 105   Temp 98 F (36.7 C) (Oral)   Resp 16   SpO2 97%   Visual Acuity Right Eye Distance:   Left Eye Distance:   Bilateral Distance:    Right Eye Near:   Left Eye Near:    Bilateral Near:     Physical Exam Vitals reviewed.  Constitutional:      General: She is awake. She is not  in acute distress.    Appearance: Normal appearance. She is well-developed. She is not ill-appearing, toxic-appearing or diaphoretic.  HENT:     Head: Normocephalic.     Right Ear: Hearing normal.     Left Ear: Hearing normal.     Nose: Nose normal.     Mouth/Throat:     Mouth: Mucous membranes are moist.  Eyes:     General: Vision grossly intact.     Conjunctiva/sclera: Conjunctivae normal.  Cardiovascular:     Rate and Rhythm: Normal rate and regular rhythm.     Heart sounds: Normal heart sounds.  Pulmonary:     Effort: Pulmonary effort is normal.     Breath sounds: Normal breath sounds and air entry.  Musculoskeletal:        General: Normal range of motion.     Cervical back: Normal range of motion and neck supple.  Skin:    General: Skin is warm and dry.  Neurological:     General: No focal deficit present.     Mental Status: She is alert and oriented to person, place, and time.  Psychiatric:        Speech: Speech normal.        Behavior: Behavior is cooperative.      UC Treatments / Results  Labs (all labs ordered are listed, but only abnormal results are displayed) Labs Reviewed  POCT URINE DIPSTICK - Abnormal; Notable for the following components:      Result Value   Color, UA light yellow (*)    Blood, UA  trace-intact (*)    Nitrite, UA Positive (*)    Leukocytes, UA Small (1+) (*)    All other components within normal limits  URINE CULTURE  POCT URINE PREGNANCY    EKG   Radiology No results found.  Procedures Procedures (including critical care time)  Medications Ordered in UC Medications - No data to display  Initial Impression / Assessment and Plan / UC Course  I have reviewed the triage vital signs and the nursing notes.  Pertinent labs & imaging results that were available during my care of the patient were reviewed by me and considered in my medical decision making (see chart for details).    Patient presents with recurrent urinary  symptoms including dysuria, urinary frequency, foul-smelling and cloudy urine, nausea, and right lower back discomfort. She has a long history of UTIs and reports this episode feels similar to prior infections. Urinalysis today is consistent with urinary tract infection, showing positive nitrites, small leukocyte esterase, and trace blood. A urine culture has been sent to identify the causative organism and to ensure antibiotic sensitivity.  Empiric treatment was started with Bactrim  DS. Pyridium  was prescribed for urinary discomfort, with counseling that it may cause orange discoloration of the urine. Zofran  was prescribed to help manage nausea. The patient was advised to increase oral fluid intake and rest. She was instructed to monitor symptoms closely and will be contacted if culture results require change in antibiotic therapy.  She was advised to seek medical care promptly if symptoms worsen, particularly if she develops fever, chills, flank pain, persistent vomiting, worsening back pain, or feels increasingly unwell, as these can indicate progression to kidney infection. Follow-up with primary care was recommended if symptoms do not improve within 48-72 hours.  Today's evaluation has revealed no signs of a dangerous process. Discussed diagnosis with patient and/or guardian. Patient and/or guardian aware of their diagnosis, possible red flag symptoms to watch out for and need for close follow up. Patient and/or guardian understands verbal and written discharge instructions. Patient and/or guardian comfortable with plan and disposition.  Patient and/or guardian has a clear mental status at this time, good insight into illness (after discussion and teaching) and has clear judgment to make decisions regarding their care  Documentation was completed with the aid of voice recognition software. Transcription may contain typographical errors.   Final Clinical Impressions(s) / UC Diagnoses   Final  diagnoses:  Dysuria  Acute cystitis with hematuria     Discharge Instructions      You were seen today for symptoms consistent with a urinary tract infection (UTI). You have been prescribed Bactrim  to treat the infection and Pyridium  to help relieve discomfort such as burning, urgency, and bladder pressure. Take the antibiotics exactly as prescribed and complete the full course, even if you start feeling better. Pyridium  may cause your urine to change color, which is a normal side effect of the medication. A urine culture has been sent to identify the specific bacteria causing the infection and to confirm that the prescribed antibiotic is appropriate. You will only be contacted if your results are abnormal; otherwise, you may review them in your MyChart account.   It is important to stay well hydrated by drinking plenty of fluids throughout the day. This helps flush out your urinary system and keeps your urine light yellow, which is a sign of good hydration. Avoid caffeine and alcohol, as they can irritate the bladder. Be sure to urinate regularly and empty your bladder fully. Do  not hold your urine for extended periods. Always wipe from front to back after using the bathroom and use a clean tissue for each wipe. It is also important to urinate after sexual activity. Avoid douching or using sprays or powders in the genital area, as these can cause irritation. Follow up with your healthcare provider if your symptoms do not improve within a few days, get worse, or return after completing your treatment.     ED Prescriptions     Medication Sig Dispense Auth. Provider   sulfamethoxazole -trimethoprim  (BACTRIM  DS) 800-160 MG tablet Take 1 tablet by mouth 2 (two) times daily for 7 days. 14 tablet Alpha Chouinard, [place], FNP   phenazopyridine  (PYRIDIUM ) 200 MG tablet Take 1 tablet (200 mg total) by mouth 3 (three) times daily at 8am, 3pm and bedtime for 2 days. 6 tablet Iola Lukes, FNP   ondansetron   (ZOFRAN -ODT) 8 MG disintegrating tablet Take 1 tablet (8 mg total) by mouth every 8 (eight) hours as needed for nausea or vomiting. 12 tablet Iola Lukes, FNP      PDMP not reviewed this encounter.   Iola Lukes, [place] 12/06/23 1431

## 2023-12-06 NOTE — Discharge Instructions (Addendum)
 You were seen today for symptoms consistent with a urinary tract infection (UTI). You have been prescribed Bactrim  to treat the infection and Pyridium  to help relieve discomfort such as burning, urgency, and bladder pressure. Take the antibiotics exactly as prescribed and complete the full course, even if you start feeling better. Pyridium  may cause your urine to change color, which is a normal side effect of the medication. A urine culture has been sent to identify the specific bacteria causing the infection and to confirm that the prescribed antibiotic is appropriate. You will only be contacted if your results are abnormal; otherwise, you may review them in your MyChart account.   It is important to stay well hydrated by drinking plenty of fluids throughout the day. This helps flush out your urinary system and keeps your urine light yellow, which is a sign of good hydration. Avoid caffeine and alcohol, as they can irritate the bladder. Be sure to urinate regularly and empty your bladder fully. Do not hold your urine for extended periods. Always wipe from front to back after using the bathroom and use a clean tissue for each wipe. It is also important to urinate after sexual activity. Avoid douching or using sprays or powders in the genital area, as these can cause irritation. Follow up with your healthcare provider if your symptoms do not improve within a few days, get worse, or return after completing your treatment.

## 2023-12-06 NOTE — ED Triage Notes (Signed)
 Pt states she had urinary symptoms two weeks ago and did e visit. She was on abx and missed one day. Presents today due to symptoms coming back and worsening. C/O urinary frequency, burning with urination, urinary odor, cloudy urine.

## 2023-12-08 LAB — URINE CULTURE: Culture: >=100000 — AB

## 2023-12-09 ENCOUNTER — Ambulatory Visit (HOSPITAL_COMMUNITY): Payer: Self-pay

## 2023-12-18 ENCOUNTER — Ambulatory Visit: Admission: EM | Admit: 2023-12-18 | Discharge: 2023-12-18 | Disposition: A | Discharge status: Home / Self Care

## 2023-12-18 ENCOUNTER — Other Ambulatory Visit: Payer: Self-pay

## 2023-12-18 DIAGNOSIS — N3001 Acute cystitis with hematuria: Secondary | ICD-10-CM | POA: Diagnosis present

## 2023-12-18 LAB — POCT URINE DIPSTICK
Bilirubin, UA: NEGATIVE
Glucose, UA: NEGATIVE mg/dL
Ketones, POC UA: NEGATIVE mg/dL
Nitrite, UA: POSITIVE — AB
POC PROTEIN,UA: NEGATIVE
Spec Grav, UA: 1.015 (ref 1.010–1.025)
Urobilinogen, UA: 0.2 U/dL
pH, UA: 7 (ref 5.0–8.0)

## 2023-12-18 LAB — POCT URINE PREGNANCY: Preg Test, Ur: NEGATIVE

## 2023-12-18 MED ORDER — CEFUROXIME AXETIL 250 MG PO TABS: 250.0000 mg | ORAL_TABLET | Freq: Two times a day (BID) | ORAL | 0 refills | Status: AC

## 2023-12-18 NOTE — ED Provider Notes (Signed)
 [facility]  Note:  This document was prepared using Dragon voice recognition software and may include unintentional dictation errors.  MRN: [id number] DOB: 04/26/1980  Subjective:   Tanya [name] is a 43 y.o. female presenting for evaluation of urinary urgency and urinary odor persistent since 12/06/2023.  Patient reports that she was seen here previously and prescribed Bactrim  for possible urinary tract infection.  Patient took meds for 4 days out of the 7 and symptoms improved.  Patient reports that then symptoms returned yesterday.  Patient took Pyridium  this morning and started taking antibiotics again but was concern for possible secondary infection.  Patient denies any severe dysuria, flank pain, abdominal pain  No current facility-administered medications for this encounter.  Current Outpatient Medications:    cefUROXime (CEFTIN) 250 MG tablet, Take 1 tablet (250 mg total) by mouth 2 (two) times daily with a meal for 7 days., Disp: 14 tablet, Rfl: 0   albuterol  (VENTOLIN  HFA) 108 (90 Base) MCG/ACT inhaler, Inhale 1-2 puffs into the lungs every 6 (six) hours as needed for wheezing or shortness of breath (cough)., Disp: 8 g, Rfl: 0   ALPRAZolam  (XANAX ) 0.25 MG tablet, Take 0.25 mg by mouth 3 (three) times daily as needed., Disp: , Rfl:    buPROPion (WELLBUTRIN XL) 300 MG 24 hr tablet, Take 300 mg by mouth every morning., Disp: , Rfl:    cyclobenzaprine  (FLEXERIL ) 5 MG tablet, , Disp: , Rfl:    escitalopram (LEXAPRO) 10 MG tablet, Take 10 mg by mouth daily., Disp: , Rfl: 12   HYDROcodone  bit-homatropine (HYCODAN) 5-1.5 MG/5ML syrup, Take 5 mLs by mouth every 6 (six) hours as needed for cough., Disp: 120 mL, Rfl: 0   meloxicam  (MOBIC ) 7.5 MG tablet, Take 1 to 2 tablets daily as needed for pain related to inflammation., Disp: 30 tablet, Rfl: 0   Methylphenidate HCl ER, PM, (JORNAY PM) 80 MG CP24, Take by mouth., Disp: , Rfl:    ondansetron  (ZOFRAN -ODT) 8 MG  disintegrating tablet, Take 1 tablet (8 mg total) by mouth every 8 (eight) hours as needed for nausea or vomiting., Disp: 12 tablet, Rfl: 0   pantoprazole  (PROTONIX ) 40 MG tablet, TAKE 1 TABLET (40 MG TOTAL) BY MOUTH DAILY., Disp: 30 tablet, Rfl: 3   rizatriptan  (MAXALT -MLT) 10 MG disintegrating tablet, Take 1 tablet at the onset of migraine. May repeat in 2 hours if needed, Disp: 9 tablet, Rfl: 11   Allergies  Allergen Reactions   Compazine  [Prochlorperazine  Edisylate] Other (See Comments)    Past Medical History:  Diagnosis Date   ADHD (attention deficit hyperactivity disorder)    Anemia    postpartum   Anxiety    COVID-19    DYSPNEA 06/07/2008   Qualifier: Diagnosis of  By: Leana, CNA, Christy     GERD (gastroesophageal reflux disease)    Healthcare maintenance 12/24/2017   History of chicken pox    History of pyelonephritis    as child   IBS (irritable bowel syndrome)    Infection    UTI   Insomnia 05/19/2018   Interstitial cystitis    Localized edema 05/19/2018   Migraine with aura and without status migrainosus, not intractable 02/01/2017   Migraines    Neuromuscular disorder (HCC) 07/13/2021   Carpal tunnel   Numbness and tingling of left leg 05/19/2018   Placenta previa antepartum 08/03/2015   S/P cesarean section 08/07/2015   Seasonal allergies    Tachycardia    TACHYCARDIA 06/07/2008   Qualifier: Diagnosis  of  By: Leana, CNA, Bari Collie uterus      Past Surgical History:  Procedure Laterality Date   CESAREAN SECTION N/A 08/07/2015   Procedure: CESAREAN SECTION;  Surgeon: Rosaline Cobble, MD;  Location: [facility] BIRTHING SUIT[facility];  Service: Obstetrics;  Laterality: N/A;   CESAREAN SECTION     dilate and curettage  2016   TUBAL LIGATION     Tubiligation      Family History  Problem Relation Age of Onset   Hypertension Mother    Hypothyroidism Mother    Thyroid  disease Mother    Other Mother        benign brain tumor   Depression Mother    Hyperlipidemia Mother     ADD / ADHD Mother    Anxiety disorder Mother    Arthritis Mother    COPD Mother    Heart disease Mother    Cancer Maternal Grandfather        breast   Heart attack Maternal Grandfather    Hyperlipidemia Maternal Grandfather    Hypertension Maternal Grandfather    Breast cancer Maternal Grandfather    Heart disease Maternal Grandfather    Lupus Paternal Grandmother    Diabetes Paternal Grandfather    Alcohol abuse Father     Social History   Tobacco Use   Smoking status: Never   Smokeless tobacco: Never  Vaping Use   Vaping status: Never Used  Substance Use Topics   Alcohol use: Yes    Alcohol/week: 2.0 standard drinks of alcohol    Types: 2 Glasses of wine per week    Comment: 1-2 per week   Drug use: No    ROS Refer to HPI for ROS details.  Objective:   Vitals: BP 133/84 (BP Location: Right Arm)   Pulse 88   Temp 98.1 F (36.7 C) (Oral)   Resp 16   Ht 5' 7 (1.702 m)   Wt 200 lb (90.7 kg)   SpO2 96%   BMI 31.32 kg/m   Physical Exam Vitals and nursing note reviewed.  Constitutional:      General: She is not in acute distress.    Appearance: Normal appearance. She is well-developed. She is not ill-appearing or toxic-appearing.  HENT:     Head: Normocephalic and atraumatic.  Cardiovascular:     Rate and Rhythm: Normal rate.  Pulmonary:     Effort: Pulmonary effort is normal. No respiratory distress.  Abdominal:     Palpations: Abdomen is soft.     Tenderness: There is no abdominal tenderness. There is no right CVA tenderness or left CVA tenderness.  Skin:    General: Skin is warm and dry.  Neurological:     General: No focal deficit present.     Mental Status: She is alert and oriented to person, place, and time.  Psychiatric:        Mood and Affect: Mood normal.        Behavior: Behavior normal.     Procedures  Results for orders placed or performed during the hospital encounter of 12/18/23 (from the past 24 hours)  POCT urine pregnancy      Status: None   Collection Time: 12/18/23  6:29 PM  Result Value Ref Range   Preg Test, Ur Negative Negative  POCT URINE DIPSTICK     Status: Abnormal   Collection Time: 12/18/23  6:29 PM  Result Value Ref Range   Color, UA yellow yellow   Clarity, UA  cloudy (A) clear   Glucose, UA negative negative mg/dL   Bilirubin, UA negative negative   Ketones, POC UA negative negative mg/dL   Spec Grav, UA 8.984 8.989 - 1.025   Blood, UA trace-intact (A) negative   pH, UA 7.0 5.0 - 8.0   POC PROTEIN,UA negative negative, trace   Urobilinogen, UA 0.2 0.2 or 1.0 E.U./dL   Nitrite, UA Positive (A) Negative   Leukocytes, UA Moderate (2+) (A) Negative    No results found.   Assessment and Plan :     Discharge Instructions       1. Acute cystitis with hematuria (Primary) - POCT urine pregnancy complete in UC is negative - POCT URINE DIPSTICK complete in UC shows positive nitrite, positive leukocytes, positive blood, these findings are strongly indicative of urinary infection. - Urine Culture collected in UC and sent to lab for further testing results should be available in 2 to 3 days. - cefUROXime (CEFTIN) 250 MG tablet; Take 1 tablet (250 mg total) by mouth 2 (two) times daily with a meal for 7 days.  Dispense: 14 tablet; Refill: 0 - Take entire course of antibiotics for the entire 7 days, do not stop medication if symptoms improve. - Drink plenty of fluids, go to the restroom when you feel the urge to urinate do not hold urine as this can increase risk for UTI. -Continue to monitor symptoms for any change in severity if there is any escalation of current symptoms or development of new symptoms follow-up in ER for further evaluation and management.     Destynee Stringfellow B Rhanda Lemire   Prerana Strayer, [place], [place] 12/18/23 1858

## 2023-12-18 NOTE — ED Triage Notes (Signed)
 Pt presents with complaints of urinary urgency and urinary odor. Was prescribed Bactrim  on 10/24 for similar symptoms. Pt shares she only took four days out of the seven. Symptoms initially improved however returned yesterday. Took Pyridium  this morning at 2 AM. Still an orange discoloration to urine. No pain. Only voicing discomfort.

## 2023-12-18 NOTE — Discharge Instructions (Addendum)
  1. Acute cystitis with hematuria (Primary) - POCT urine pregnancy complete in UC is negative - POCT URINE DIPSTICK complete in UC shows positive nitrite, positive leukocytes, positive blood, these findings are strongly indicative of urinary infection. - Urine Culture collected in UC and sent to lab for further testing results should be available in 2 to 3 days. - cefUROXime (CEFTIN) 250 MG tablet; Take 1 tablet (250 mg total) by mouth 2 (two) times daily with a meal for 7 days.  Dispense: 14 tablet; Refill: 0 - Take entire course of antibiotics for the entire 7 days, do not stop medication if symptoms improve. - Drink plenty of fluids, go to the restroom when you feel the urge to urinate do not hold urine as this can increase risk for UTI. -Continue to monitor symptoms for any change in severity if there is any escalation of current symptoms or development of new symptoms follow-up in ER for further evaluation and management.

## 2023-12-21 LAB — URINE CULTURE
Culture: >=100000 — AB
Special Requests: NORMAL

## 2023-12-23 ENCOUNTER — Ambulatory Visit (HOSPITAL_COMMUNITY): Payer: Self-pay

## 2023-12-24 ENCOUNTER — Other Ambulatory Visit (HOSPITAL_BASED_OUTPATIENT_CLINIC_OR_DEPARTMENT_OTHER): Payer: Self-pay

## 2023-12-24 ENCOUNTER — Telehealth (HOSPITAL_BASED_OUTPATIENT_CLINIC_OR_DEPARTMENT_OTHER): Payer: Self-pay

## 2023-12-24 MED ORDER — NITROFURANTOIN MONOHYD MACRO 100 MG PO CAPS
100.0000 mg | ORAL_CAPSULE | Freq: Two times a day (BID) | ORAL | 0 refills | Status: AC
Start: 2023-12-24 — End: ?

## 2023-12-24 NOTE — Telephone Encounter (Signed)
 The patient has called and reported continued symptoms of acute UTI after completing 7 days of twice daily Ceftin. Her symptoms improved some while on the antibiotic but never fully resolved and her symptoms are worsening again (dysuria, urinary frequency, urinary urgency).  Due to no second gen cephalosporin on sensitivity on urine culture, this was sent to and reviewed by CANDIE Settler, FNP.  Per NP, Will treat with Nitrofurantoin , 100mg  twice daily for 7 days. If symptoms do not resolve, needs to be seen/re-evaluated and have a new urine specimen/culture. If symptoms persist, she may need to see Primary Care and get a referral to Urology.  Reviewed with patient, verified pharmacy, prescription sent

## 2023-12-24 NOTE — Telephone Encounter (Signed)
 See results encounter note

## 2023-12-25 ENCOUNTER — Other Ambulatory Visit (HOSPITAL_BASED_OUTPATIENT_CLINIC_OR_DEPARTMENT_OTHER): Payer: Self-pay
